# Patient Record
Sex: Female | Born: 1935 | Race: Black or African American | Hispanic: No | Smoking: Never smoker
Health system: Southern US, Community
[De-identification: ages and names within clinical notes are randomized; demographics above are authoritative.]

## PROBLEM LIST (undated history)

## (undated) DIAGNOSIS — K5521 Angiodysplasia of colon with hemorrhage: Secondary | ICD-10-CM

## (undated) DIAGNOSIS — N200 Calculus of kidney: Secondary | ICD-10-CM

## (undated) DIAGNOSIS — I509 Heart failure, unspecified: Secondary | ICD-10-CM

## (undated) DIAGNOSIS — R51 Headache: Secondary | ICD-10-CM

## (undated) DIAGNOSIS — R933 Abnormal findings on diagnostic imaging of other parts of digestive tract: Secondary | ICD-10-CM

## (undated) DIAGNOSIS — R413 Other amnesia: Secondary | ICD-10-CM

## (undated) DIAGNOSIS — I639 Cerebral infarction, unspecified: Secondary | ICD-10-CM

## (undated) HISTORY — PX: APPENDECTOMY: SHX54

## (undated) HISTORY — DX: Cerebral infarction, unspecified: I63.9

## (undated) HISTORY — DX: Headache: R51

## (undated) HISTORY — PX: ABDOMINAL HYSTERECTOMY: SHX81

## (undated) HISTORY — DX: Calculus of kidney: N20.0

## (undated) HISTORY — DX: Other amnesia: R41.3

---

## 2008-12-21 ENCOUNTER — Ambulatory Visit: Payer: Self-pay | Admitting: Cardiology

## 2008-12-21 ENCOUNTER — Inpatient Hospital Stay (HOSPITAL_COMMUNITY): Admission: EM | Admit: 2008-12-21 | Discharge: 2008-12-23 | Payer: Self-pay | Admitting: Neurology

## 2008-12-23 ENCOUNTER — Encounter (INDEPENDENT_AMBULATORY_CARE_PROVIDER_SITE_OTHER): Payer: Self-pay | Admitting: Neurology

## 2010-08-19 LAB — URINALYSIS, MICROSCOPIC ONLY
Glucose, UA: NEGATIVE mg/dL
Ketones, ur: NEGATIVE mg/dL
Leukocytes, UA: NEGATIVE
Nitrite: NEGATIVE
Specific Gravity, Urine: 1.018 (ref 1.005–1.030)
pH: 5 (ref 5.0–8.0)

## 2010-08-19 LAB — HEMOGLOBIN A1C
Hgb A1c MFr Bld: 5.2 % (ref 4.6–6.1)
Mean Plasma Glucose: 103 mg/dL

## 2010-08-19 LAB — RETICULOCYTES: Retic Count, Absolute: 13 10*3/uL — ABNORMAL LOW (ref 19.0–186.0)

## 2010-08-19 LAB — FERRITIN: Ferritin: 133 ng/mL (ref 10–291)

## 2010-08-19 LAB — LIPID PANEL
HDL: 85 mg/dL (ref >39)
Total CHOL/HDL Ratio: 3.2 RATIO
VLDL: 15 mg/dL (ref 0–40)

## 2010-08-19 LAB — FOLATE: Folate: >20 ng/mL

## 2010-08-19 LAB — IRON AND TIBC
Iron: 43 ug/dL (ref 42–135)
Saturation Ratios: 17 % — ABNORMAL LOW (ref 20–55)
TIBC: 255 ug/dL (ref 250–470)

## 2010-08-19 LAB — VITAMIN B12: Vitamin B-12: 529 pg/mL (ref 211–911)

## 2010-09-25 NOTE — H&P (Signed)
NAMEDELAYSIA, PAUP           ACCOUNT NO.:  [id number]   MEDICAL RECORD NO.:  [id number]          PATIENT TYPE:  INP   LOCATION:  3104                         FACILITY:  [facility]   PHYSICIAN:  Pramod P. Leonie Man, MD    DATE OF BIRTH:  1922-09-08   DATE OF ADMISSION:  12/21/2008  DATE OF DISCHARGE:                              HISTORY & PHYSICAL   REFERRING PHYSICIAN:  Dr. Deatra Robinson from [facility].   REASON FOR REFERRAL:  Intracerebral hemorrhage.   HISTORY OF PRESENT ILLNESS:  Ms. Pfingston is an 77 year old pleasant  African American lady, who developed sudden onset of generalized  headache at 9 a.m. today.  She described the headache as being all over.  She felt nauseous and in fact she had vertiginous symptoms, which lasted  several hours.  This was positional.  When she was sitting up and  standing, she was not vertiginous, but when she tried to lie down in  bed, she felt her head was spinning.  She was nauseous and she threw up  several times.  She denied any slurred speech, difficulty walking,  balance problems, focal weakness, or numbness.  She was taken to  [facility] where she was found to be photophobic,  but without any focal neurological deficits.  Blood pressure was not  found to be significantly elevated.  A CT scan of the head showed a 1.2  x 0.5-cm left inferofrontal subcortical hemorrhage with some mass  effect, but no intraventricular extension or midline shift.  She was  awake and interactive.  Due to absence of any neurosurgical help, the  patient was transferred to [facility] to the Neurological ICU for higher  level of neurological care.  She remained stable during the  transportation, and has had no further symptoms.  She received some  fentanyl and Reglan with only partial relief of her headache.  She has  no known prior history of neurological problems, stroke, TIA.  She has  no history of hypertension  either.   Her past medical history is significant for hyperlipidemia.   HOME MEDICATIONS:  1. Plavix 75 mg a day.  2. Niaspan ER 500 mg daily.  3. Prevacid 30 mg a day.  4. Vitamin E 400 mg a day.  5. Fish oil 100 mg a day.  6. Calcium with vitamin D 600 once a day.   SOCIAL HISTORY:  She is independent activity of daily living.  Lives  alone in [place] and recently son is living with her for the last 1  month.  She does not smoke or drink.   MEDICATION ALLERGIES:  None listed.   FAMILY HISTORY:  Noncontributory.   REVIEW OF SYSTEMS:  Negative for any recent fever, cough, chest pain,  shortness of breath, diarrhea, or other illness.   PHYSICAL EXAMINATION:  GENERAL:  A pleasant African American elderly  lady, who looks younger than her age.  VITAL SIGNS:  She is afebrile, pulse rate is 78 per minute, blood  pressure 137/95.  Distal pulses are well felt.  HEAD:  Nontraumatic.  NECK:  Supple.  There is no bruits.  ENT:  Unremarkable.  CARDIAC:  No murmur or gallop.  LUNGS:  Clear to auscultation.  NEUROLOGIC:  She is pleasant, awake, alert, cooperative.  She has  photophobia and tends to keep her eye closed, but can open it when asked  to do so.  Pupils are equal reactive.  Eye movements are full range.  There is no nystagmus or visual field deficit.  Face is symmetric.  Tongue is midline.  Motor system exam reveals no upper or lower  extremity drift.  There was no focal weakness.  Deep tendon reflexes are  2+ and symmetric.  Plantars are downgoing.  Finger-to-nose and  coordination are accurate.  Sensation is normal.  Gait was not tested.  On the NIH stroke scale, she is scored 0.  Glasgow coma scale, she  scored 10.   Data reviewed, ER visit note from [facility] was  reviewed.  CT scan of the head shows small 1.2 x 0.5-cm left inferior  frontal subcortical white matter hemorrhage with some surrounding edema  without intraventricular extension.  CBC  and basic metabolic panel labs  performed is unremarkable except alkaline phosphatase was elevated at  340.  SGOT was elevated at 41, bilirubin and serum albumin are normal.  Electrolytes are normal.  WBC count was normal.   IMPRESSION:  An 77 year old lady with left frontal subcortical small  hemorrhage with some surrounding edema of unclear etiology.  She  certainly has no history of hypertension and her clinical history is not  typical for hypertensive hemorrhage.  Possibilities include hemorrhagic  metastasis versus hemorrhagic infarct or some underlying vascular  abnormality.   PLAN:  The patient will be admitted to the Neurological Intensive Care  Unit for further evaluation and management.  We will keep strict control  of blood pressure and keep systolic blood pressure goal below 150.  Check MRI scan of the brain with and without contrast in the morning to  look for any underlying structural or vascular lesions.  Further testing  will be determined by the MRI.  She need may angiogram if vascular  abnormalities identified.  I had a long discussion with the patient,  daughter, son, and other family members and explained the above plan and  answered questions.  She will have a bedside stroke swallow screen and  if she passes, we will start her on diet and medications for headache.  We will have to stop the Plavix indefinitively and may consider resuming  aspirin in a month or so if clinically necessary.           ______________________________  Kathie Rhodes. Leonie Man, MD     PPS/MEDQ  D:  12/21/2008  T:  12/22/2008  Job:  [id number]

## 2010-09-25 NOTE — Discharge Summary (Signed)
[name], [name] NO.:  [id number]   MEDICAL RECORD NO.:  [id number]          PATIENT TYPE:  INP   LOCATION:  3104                         FACILITY:  [facility]   PHYSICIAN:  Marcial Pacas, MD          DATE OF BIRTH:  04/15/1923   DATE OF ADMISSION:  12/21/2008  DATE OF DISCHARGE:  12/23/2008                               DISCHARGE SUMMARY   DIAGNOSES AT TIME OF DISCHARGE:  1. Small left frontal subcortical hemorrhage, question etiology not      hypertensive, not metastatic, question related to small-vessel      disease.  2. 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  3. Hyperlipidemia.   MEDICINES AT TIME OF DISCHARGE:  1. Niaspan ER 500 mg a day.  2. Prevacid 30 mg a day.  3. Vitamin E 400 mg a day.  4. Fish oil 100 mg a day.  5. Calcium plus vitamin D 600 mg once a day.   STUDIES PERFORMED:  1. CT of the brain on admission was done at [facility] where it      showed a 1.2 x 0.5-cm left inferior frontal subcortical hemorrhage      with some mass effect, but no intraventricular extension or midline      shift.  MRI of the brain done here showed a 10-mm focus of      hemorrhage in the anterior left corona radiata with mild      surrounding edema and no mass effect.  Favored acute hypertensive/      small vessel hemorrhage in light of other sequelae of advanced      small vessel disease.  No enhancement.  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  2. MRA of the brain showed intracranial atherosclerosis of medium-      sized vessels included bilateral PCA and right ACA branches.      Preserved distal flow and no focal branch occlusion.  No      abnormalities.  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  3. EKG shows sinus rhythm.  4. Laboratory studies showed hemoglobin A1c 5.2, cholesterol  270,      triglycerides 73, HDL 85, and LDL 170.  Sedimentation rate 44.      Urinalysis negative.  Anemia panel shows red blood cells 3.24,      reticulocyte 13.  Iron 43, TIBC 255%, saturation is 17.  UIBC 212,      vitamin B12 529 and serum folate greater than 20.   HISTORY OF PRESENT ILLNESS:  Ms. Allison Gardner is an 77 year old right-  handed African American female who developed sudden onset of generalized  headache at 9:00 a.m. the day of admission.  She described the headache  has been all over.  She felt nauseous and had vertigo, which lasted  several hours.  This was positional.  When she was  sitting up and  standing, she was not vertiginous, but when she tried to lie down in  bed, she felt her head was spinning.  She was nauseated and threw up  several times.  She denied any slurred speech, difficulty walking,  balance problems, focal weakness or numbness.  She was taken to [facility] where she was found to be photophobic, but  without any focal or neurologic deficits.  Blood pressure was not  elevated.  CT of the brain showed a 1.2 x 0.5 cm left inferior frontal  cortical hemorrhage with some mass effect, but no intraventricular  extension or midline shift.  She was awake and interactive.  Due to  absence of the neurosurgical help within the area, the patient was  transferred to [facility] to the ICU for higher level of neurologic  care.  She remained stable during the transport and had no further  symptoms.  She received Fentanyl and Reglan with only partial relief of  her headache.  She has no known history of neurologic problems stroke or  TIA.  She has no history of hypertension.  She has a significant history  for hyperlipidemia.  She was admitted to the neuro ICU for further  stroke evaluation.  She was not a TPA candidate secondary to hemorrhage.   HOSPITAL COURSE:  MRI did reveal acute hemorrhage the left inferior  frontal region with some mild  surrounding edema.  She had no enhancement  or other hemorrhages.  She had no acute ischemic strokes.  It is felt  the hemorrhage was likely small vessel.  Her blood pressure remained  within normal limits during hospitalization in 100-120s/70s to 80s.  She  was resumed on her home medications except for Plavix, which will be  held indefinitely.  We will consider resumption of low-dose aspirin in 1  month should the patient remained neurologically stable.  PT and OT  evaluated her and felt she was safe to discharge home with either  outpatient or home health therapy of her choice.  This was discussed at  length with her 3 daughters and herself.  At time of this dictation  family is unsure if they will keep her here in [place] with doctor  here or take her back to [place] with doctor there.  Regardless they  will arrange therapy as ordered.   CONDITION ON DISCHARGE:  The patient alert and oriented to person and  place.  She has extraocular movements that are full.  Visual fields were  full and no facial weakness.  She has a minimal left upper extremity  drift and decreased fine motor movement on the left.  She has no lower  extremity weakness.  Her deep tendon reflexes were 2+ bilaterally and  her plantars are down bilaterally.  Heart rate is regular.  Breath  sounds are clear.   DISCHARGE PLAN:  1. Discharge home with family.  2. Home health or outpatient PT and OT.  3. Stop Plavix.  Consider low-dose aspirin in 1 month, if      neurologically stable.  4. Follow up with primary care physician within 1 month.  5. Follow up Dr. Antony Contras in 2 month or neurologist in her area.      Allison Gardner, N.P.      Marcial Pacas, MD  Electronically Signed    SB/MEDQ  D:  12/23/2008  T:  12/24/2008  Job:  [id number]   cc:   Pramod P. Leonie Man, MD  Andres Shad

## 2012-08-08 ENCOUNTER — Telehealth: Payer: Self-pay | Admitting: *Deleted

## 2012-08-08 NOTE — Telephone Encounter (Signed)
Spoke to patient to cancel appointment. Will call her back next week to reschedule. If patient does not here back Wedensday she will call me because she said it is something wrong with her phone.

## 2012-08-20 ENCOUNTER — Ambulatory Visit: Payer: Self-pay | Admitting: Nurse Practitioner

## 2012-08-26 NOTE — Telephone Encounter (Signed)
Spoke to the patient and she scheduled for 09-03-12 to see cm at 315.

## 2012-09-03 ENCOUNTER — Ambulatory Visit (INDEPENDENT_AMBULATORY_CARE_PROVIDER_SITE_OTHER): Payer: Medicare Other | Admitting: Nurse Practitioner

## 2012-09-03 ENCOUNTER — Encounter: Payer: Self-pay | Admitting: Nurse Practitioner

## 2012-09-03 VITALS — BP 130/60 | HR 81 | Ht 60.0 in | Wt 125.0 lb

## 2012-09-03 DIAGNOSIS — R42 Dizziness and giddiness: Secondary | ICD-10-CM | POA: Insufficient documentation

## 2012-09-03 DIAGNOSIS — I6990 Unspecified sequelae of unspecified cerebrovascular disease: Secondary | ICD-10-CM | POA: Insufficient documentation

## 2012-09-03 DIAGNOSIS — R51 Headache: Secondary | ICD-10-CM

## 2012-09-03 DIAGNOSIS — R519 Headache, unspecified: Secondary | ICD-10-CM | POA: Insufficient documentation

## 2012-09-03 NOTE — Progress Notes (Signed)
HPI: Patient returns for followup after her last visit 11/07/2011. 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 She continues with intermittent episodes of dizziness. Last carotid Dopplers without stenosis. She is currently on baby aspirin with minimal bruising. She takes an occasional Tylenol for headache. She has no new complaints  ROS:    memory, feeling cold, allergies, not enough sleep, decreased energy, sleepiness, headache,   Physical Exam General: well developed, well nourished, seated, in no evident distress Head: head normocephalic and atraumatic. Oropharynx benign Neck: supple with no carotid or supraclavicular bruits Cardiovascular: regular rate and rhythm, no murmurs  Neurologic Exam Mental Status: Awake and fully alert. Oriented to place and time. Follows one 2 and 3 step commands speech and language appear normal   Cranial Nerves:Pupils equal, briskly reactive to light. Extraocular movements full without nystagmus. Visual fields full to confrontation. Hearing decreased bilaterally  Facial sensation intact. Face, tongue, palate move normally and symmetrically. Neck flexion and extension normal.  Motor: Normal bulk and tone. Normal strength in all tested extremity muscles. No focal weakness Sensory.: intact to touch and pinprick and vibratory.  Coordination: Rapid alternating movements normal in all extremities. Finger-to-nose and heel-to-shin performed accurately bilaterally. Gait and Station: Arises from chair without difficulty. Slow steady gait mild difficulty with tandem no assistive device   Reflexes: 2+ and symmetric. Toes downgoing.     ASSESSMENT: Remote history of intercranial hemorrhage, pontine stroke and lacunar infarcts transient dizziness likely peripheral positional vertigo.     PLAN: Stay on Aspirin for secondary stroke prevention  Avoid  sudden head turning movements to prevent dizziness  followup in one year and when necessary as necessary We will repeat carotid Dopplers next year  Dennie Bible, GNP-BC APRN

## 2012-09-03 NOTE — Patient Instructions (Addendum)
Stay on Aspirin for secondary stroke prevention  Avoid sudden head turning movements to prevent dizziness  followup in one year and when necessary as necessary

## 2013-04-15 DIAGNOSIS — S32599A Other specified fracture of unspecified pubis, initial encounter for closed fracture: Secondary | ICD-10-CM | POA: Insufficient documentation

## 2013-04-15 HISTORY — DX: Other specified fracture of unspecified pubis, initial encounter for closed fracture: S32.599A

## 2013-09-01 ENCOUNTER — Encounter: Payer: Self-pay | Admitting: Neurology

## 2013-09-01 ENCOUNTER — Encounter (INDEPENDENT_AMBULATORY_CARE_PROVIDER_SITE_OTHER): Payer: Self-pay

## 2013-09-01 ENCOUNTER — Ambulatory Visit (INDEPENDENT_AMBULATORY_CARE_PROVIDER_SITE_OTHER): Payer: Medicare Other | Admitting: Neurology

## 2013-09-01 VITALS — BP 177/85 | HR 86 | Ht 60.0 in | Wt 128.0 lb

## 2013-09-01 DIAGNOSIS — I6529 Occlusion and stenosis of unspecified carotid artery: Secondary | ICD-10-CM

## 2013-09-01 DIAGNOSIS — G3184 Mild cognitive impairment, so stated: Secondary | ICD-10-CM

## 2013-09-01 DIAGNOSIS — R413 Other amnesia: Secondary | ICD-10-CM | POA: Insufficient documentation

## 2013-09-01 NOTE — Progress Notes (Signed)
HPI:    Update 09/01/2013 : She returns for followup of her last visit a year ago. She continues to do well from a neurovascular standpoint without recurrent stroke or TIA symptoms. She did have a fall in last November in a store but did not have major injuries. She had laser eye surgery last month which went well. She still continues to have occasional positional dizziness but this is not as bothersome. She walks with a cane only for outdoors long distances but does fairly well without it indoors. Her blood pressure on arrival today was elevated at 177/85 but when I checked it it was down to 140/80. She has been tolerating aspirin well. Office visit 09/03/2012 ;Patient returns for followup after her last visit 11/07/2011. 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 She continues with intermittent episodes of dizziness. Last carotid Dopplers without stenosis. She is currently on baby aspirin with minimal bruising. She takes an occasional Tylenol for headache. She has no new complaints  ROS:    Positive for appetite change, fatigue, light sensitivity, runny nose, cough, leg swelling, constipation, incontinence of bladder, joint pain, walking difficulty, itching, agitation, memory loss dizziness and all other systems negative. Filed Vitals:   09/01/13 1452  BP: 177/85  Pulse: 86    Physical Exam General: well developed, well nourished, seated, in no evident distress Head: head normocephalic and atraumatic. Oropharynx benign Neck: supple with no carotid or supraclavicular bruits Cardiovascular: regular rate and rhythm, no murmurs  Neurologic Exam Mental Status: Awake and fully alert. Oriented to place and time. Follows one 2 and 3 step commands speech and language appear normal  Mini-Mental status exam score 26/29 with deficits in orientation and recall .Animal naming test 13.  Clock .4/4. Ron Parker index of activities of daily living 6 which is independent. Geriatric depression scale 2 not depressed Cranial Nerves:Pupils equal, briskly reactive to light. Extraocular movements full without nystagmus. Visual fields full to confrontation. Hearing decreased bilaterally  Facial sensation intact. Face, tongue, palate move normally and symmetrically. Neck flexion and extension normal.  Motor: Normal bulk and tone. Normal strength in all tested extremity muscles. No focal weakness Sensory.: intact to touch and pinprick and vibratory.  Coordination: Rapid alternating movements normal in all extremities. Finger-to-nose and heel-to-shin performed accurately bilaterally. Gait and Station: Arises from chair without difficulty. Slow steady gait mild difficulty with tandem no assistive device   Reflexes: 2+ and symmetric. Toes downgoing.     ASSESSMENT: Remote history of intracranial hemorrhage, pontine stroke and lacunar infarcts transient dizziness likely peripheral positional vertigo.     PLAN: I had a long discussion with the patient and daughter regarding the prior strokes and mild memory loss and answered questions. I recommend she take 2 visual capsules daily and keep herself of occupied mentally challenged activities. I do not believe her memory difficulties are severe enough to justify a trial of Aricept at this point. Return for followup in 6 months with Cecille Rubin, NP. Check followup carotid ultrasound study.

## 2013-09-01 NOTE — Patient Instructions (Signed)
I had a long discussion with the patient and daughter regarding the prior strokes and mild memory loss and answered questions. I recommend she take 2 visual capsules daily and keep herself of occupied mentally challenged activities. I do not believe her memory difficulties are severe enough to justify a trial of Aricept at this point. Return for followup in 6 months with Cecille Rubin, NP. Check followup carotid ultrasound study.

## 2013-09-15 ENCOUNTER — Ambulatory Visit (INDEPENDENT_AMBULATORY_CARE_PROVIDER_SITE_OTHER): Payer: Medicare Other

## 2013-09-15 DIAGNOSIS — R413 Other amnesia: Secondary | ICD-10-CM

## 2013-09-15 DIAGNOSIS — I6529 Occlusion and stenosis of unspecified carotid artery: Secondary | ICD-10-CM

## 2013-10-05 ENCOUNTER — Telehealth: Payer: Self-pay | Admitting: *Deleted

## 2013-10-05 NOTE — Telephone Encounter (Signed)
Pt calling for her carotid doppler study results.  Normal study.  Repeat in 12 months.  Pt verbalized understanding.

## 2014-03-10 ENCOUNTER — Ambulatory Visit: Payer: Medicare Other | Admitting: Nurse Practitioner

## 2014-03-23 ENCOUNTER — Ambulatory Visit (INDEPENDENT_AMBULATORY_CARE_PROVIDER_SITE_OTHER): Payer: Medicare Other | Admitting: Nurse Practitioner

## 2014-03-23 ENCOUNTER — Encounter: Payer: Self-pay | Admitting: Nurse Practitioner

## 2014-03-23 VITALS — BP 145/68 | HR 82 | Temp 97.6°F | Ht 60.0 in | Wt 119.0 lb

## 2014-03-23 DIAGNOSIS — I6990 Unspecified sequelae of unspecified cerebrovascular disease: Secondary | ICD-10-CM

## 2014-03-23 DIAGNOSIS — R413 Other amnesia: Secondary | ICD-10-CM

## 2014-03-23 DIAGNOSIS — I6529 Occlusion and stenosis of unspecified carotid artery: Secondary | ICD-10-CM

## 2014-03-23 DIAGNOSIS — G3184 Mild cognitive impairment, so stated: Secondary | ICD-10-CM

## 2014-03-23 NOTE — Patient Instructions (Signed)
Memory score is stable.  Recent carotid doppler study negative F/U  8 months

## 2014-03-23 NOTE — Progress Notes (Signed)
[facility]  PATIENT: Allison Gardner DOB: [birth year]   REASON FOR VISIT: follow-up for memory loss, history of intracranial hemorrhage   HISTORY OF PRESENT ILLNESS:Allison Gardner, 80 year old female returns for follow-up. She was last seen by Dr.'s Leonie Man 09/01/2013. She continues to do well from a neurovascular standpoint without recurrent stroke or TIA symptoms.Recent fall with pelvic fx. She was not using her assistive device when she fell. She  still continues to have occasional positional dizziness but this is not as bothersome. She walks with a cane.  She has been tolerating aspirin well with minimal bruising.Carotid Doppler after last visit was negative for significant stenosis.She continues to live independently and perform all of her ADLs. She continues to cook. She no longer drives. She returns for reevaluation  Office visit 09/03/2012 ;Patient returns for followup after her last visit 11/07/2011. 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 She continues with intermittent episodes of dizziness. Last carotid Dopplers without stenosis. She is currently on baby aspirin with minimal bruising. She takes an occasional Tylenol for headache. She has no new complaints   REVIEW OF SYSTEMS: Full 14 system review of systems performed and notable only for those listed, all others are neg:  Constitutional: fatigue Cardiovascular: leg swelling Ear/Nose/Throat: N/A  Skin: N/A  Eyes: blurred vision Respiratory: N/A  Gastroitestinal: urinary frequency Hematology/Lymphatic: N/A  Endocrine: N/A Musculoskeletal:joint pain Allergy/Immunology: N/A  Neurological: occasional headache Psychiatric: N/A Sleep : NA   ALLERGIES: Allergies  Allergen Reactions  . Ampicillin   . Demerol [Meperidine]     HOME MEDICATIONS: Outpatient Prescriptions Prior  to Visit  Medication Sig Dispense Refill  . Acetaminophen (TYLENOL PO) Take by mouth.    Marland Kitchen aspirin 81 MG tablet Take 81 mg by mouth daily.    . Calcium-Vitamin D 600-200 MG-UNIT per tablet Take 1 tablet by mouth daily.    . Omega-3 Fatty Acids (FISH OIL) 1000 MG CAPS Take by mouth.    . vitamin E 400 UNIT capsule Take 400 Units by mouth daily.    Marland Kitchen amitriptyline (ELAVIL) 50 MG tablet Take 50 mg by mouth at bedtime.      No facility-administered medications prior to visit.    PAST MEDICAL HISTORY: Past Medical History  Diagnosis Date  . Stroke   . Headache(784.0)     PAST SURGICAL HISTORY: History reviewed. No pertinent past surgical history.  FAMILY HISTORY: History reviewed. No pertinent family history.  SOCIAL HISTORY: History   Social History  . Marital Status: Widowed    Spouse Name: N/A    Number of Children: N/A  . Years of Education: N/A   Occupational History  . Not on file.   Social History Main Topics  . Smoking status: Never Smoker   . Smokeless tobacco: Never Used  . Alcohol Use: No  . Drug Use: No  . Sexual Activity: Not on file   Other Topics Concern  . Not on file   Social History Narrative   Patient lives at home her son stay with her.     PHYSICAL EXAM  Filed Vitals:   03/23/14 1313  BP: 145/68  Pulse: 82  Temp: 97.6 F (36.4 C)  TempSrc: Oral  Height: 5' (1.524 m)  Weight: 119 lb (53.978 kg)   Body mass index is 23.24 kg/(m^2). General: well developed, well nourished, seated, in no evident distress Head: head normocephalic  and atraumatic. Oropharynx benign Neck: supple with no carotid or supraclavicular bruits Cardiovascular: regular rate and rhythm, no murmurs  Neurologic Exam Mental Status: Awake and fully alert. Oriented to place and time. Follows 1, 2 and 3 step commands speech and language appear normal Mini-Mental status exam score 25/29 with deficits in orientation calculation and recall .Animal naming test 14.   Geriatric depression scale 2 not depressed. Cranial Nerves:Pupils equal, briskly reactive to light. Extraocular movements full without nystagmus. Visual fields full to confrontation. Hearing decreased bilaterally Facial sensation intact. Face, tongue, palate move normally and symmetrically. Neck flexion and extension normal.  Motor: Normal bulk and tone. Normal strength in all tested extremity muscles. No focal weakness Sensory.: intact to touch and pinprick and vibratory.  Coordination: Rapid alternating movements normal in all extremities. Finger-to-nose and heel-to-shin performed accurately bilaterally. Gait and Station: Arises from chair without difficulty. Slow steady gait mild difficulty with tandem, ambulates with single-point cane  Reflexes: 2+ and symmetric. Toes downgoing.    DIAGNOSTIC DATA (LABS, IMAGING, TESTING) - I reviewed patient records, labs, notes, testing and imaging myself where available.  ASSESSMENT AND PLAN  80 y.o. year old female  has a past medical history of Stroke and Headache(784.0). here for follow-up. Most recent carotid Doppler 5/6/ 2015 was negative for significant stenosis.   Memory score is stable.  Continue baby aspirin F/U  8 months Dennie Bible, [facility], [facility], APRN  [facility] [address], [place], [place] [address] [phone]

## 2014-03-24 NOTE — Progress Notes (Signed)
I agree with the above plan 

## 2014-03-30 ENCOUNTER — Encounter: Payer: Self-pay | Admitting: Neurology

## 2014-04-05 ENCOUNTER — Encounter: Payer: Self-pay | Admitting: Neurology

## 2014-09-22 ENCOUNTER — Encounter: Payer: Self-pay | Admitting: Neurology

## 2014-09-22 ENCOUNTER — Ambulatory Visit (INDEPENDENT_AMBULATORY_CARE_PROVIDER_SITE_OTHER): Payer: Medicare HMO | Admitting: Neurology

## 2014-09-22 VITALS — BP 155/67 | HR 70

## 2014-09-22 DIAGNOSIS — I6990 Unspecified sequelae of unspecified cerebrovascular disease: Secondary | ICD-10-CM | POA: Diagnosis not present

## 2014-09-22 NOTE — Progress Notes (Signed)
[facility]  PATIENT: Allison Gardner DOB: [birth year]   REASON FOR VISIT: follow-up for memory loss, history of intracranial hemorrhage   HISTORY OF PRESENT ILLNESS:Ms. Allison Gardner, 81 year old female returns for follow-up. She was last seen by Dr.'s Leonie Man 09/01/2013. She continues to do well from a neurovascular standpoint without recurrent stroke or TIA symptoms.Recent fall with pelvic fx. She was not using her assistive device when she fell. She  still continues to have occasional positional dizziness but this is not as bothersome. She walks with a cane.  She has been tolerating aspirin well with minimal bruising.Carotid Doppler after last visit was negative for significant stenosis.She continues to live independently and perform all of her ADLs. She continues to cook. She no longer drives. She returns for reevaluation  Office visit 09/03/2012 ;Patient returns for followup after her last visit 11/07/2011. 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 She continues with intermittent episodes of dizziness. Last carotid Dopplers without stenosis. She is currently on baby aspirin with minimal bruising. She takes an occasional Tylenol for headache. She has no new complaints Update 09/22/2014 : She returns for follow-up today after last visit 6 months ago. She is accompanied by 2 of her sons who provide history. She is celebrating her 89th birthday today. She continues to do in reasonably good health. Her blood pressure is quite well controlled usually do it is slightly elevated at 155/69 in office today. She is lives with her son and is fairly independent in most activities of daily living. She the son has to handle only her finances. She walks with a cane reasonably well though she continues to drag the left leg which is stiff and weak. She has had no falls. Her  mild memory difficulties remain unchanged. She's had no new interval health problems.  REVIEW OF SYSTEMS: Full 14 system review of systems performed and notable only for those listed, all others are neg: Memory loss, headache, chills, ringing in the ears, leg swelling, restless legs, insomnia, daytime sleepiness, walking difficulty and all other systems negative    ALLERGIES: Allergies  Allergen Reactions  . Ampicillin   . Demerol [Meperidine]     HOME MEDICATIONS: Outpatient Prescriptions Prior to Visit  Medication Sig Dispense Refill  . Acetaminophen (TYLENOL PO) Take by mouth.    Marland Kitchen aspirin 81 MG tablet Take 81 mg by mouth daily.    . Calcium-Vitamin D 600-200 MG-UNIT per tablet Take 1 tablet by mouth daily.    . Omega-3 Fatty Acids (FISH OIL) 1000 MG CAPS Take by mouth.    . vitamin E 400 UNIT capsule Take 400 Units by mouth daily.     No facility-administered medications prior to visit.    PAST MEDICAL HISTORY: Past Medical History  Diagnosis Date  . Stroke   . Headache(784.0)   . Kidney stones     PAST SURGICAL HISTORY: Past Surgical History  Procedure Laterality Date  . Abdominal hysterectomy    . Appendectomy      FAMILY HISTORY: History reviewed. No pertinent family history.  SOCIAL HISTORY: History   Social History  . Marital Status: Widowed    Spouse Name: N/A  . Number of Children: N/A  . Years of Education: N/A   Occupational History  . Not on file.   Social History Main Topics  . Smoking status: Never Smoker   . Smokeless tobacco: Never  Used  . Alcohol Use: No  . Drug Use: No  . Sexual Activity: Not on file   Other Topics Concern  . Not on file   Social History Narrative   Patient lives at home her son stay with her.     PHYSICAL EXAM  Filed Vitals:   09/22/14 1314  BP: 155/67  Pulse: 70   There is no weight on file to calculate BMI. General: well developed, well nourished, seated, in no evident distress. Mild  kyphoscoliosis Head: head normocephalic and atraumatic.    Neck: supple with no carotid or supraclavicular bruits Cardiovascular: regular rate and rhythm, no murmurs  Neurologic Exam Mental Status: Awake and fully alert. Oriented to place and time. Follows 1, 2 and 3 step commands speech and language appear normal Mini-Mental status exam score 28/30 with deficits in attention and recall .Animal naming test 10. Clock drawing 3/4. Geriatric depression scale 1 not depressed. Cranial Nerves:Pupils equal, briskly reactive to light. Extraocular movements full without nystagmus. Visual fields full to confrontation. Hearing decreased bilaterally Facial sensation intact. Face, tongue, palate move normally and symmetrically. Neck flexion and extension normal.  Motor: Normal bulk and tone. Mild stiffness of right leg with 4/5 weakness. Slight increased tone in the right leg. Sensory.: intact to touch and pinprick and vibratory.  Coordination: Rapid alternating movements normal in all extremities. Finger-to-nose and heel-to-shin performed accurately bilaterally. Gait and Station: Arises from chair without difficulty. Slow steady gait mild difficulty with dragging of right leg   ambulates with single-point cane  Reflexes: 2+ and symmetric. Toes downgoing.    DIAGNOSTIC DATA (LABS, IMAGING, TESTING) - I reviewed patient records, labs, notes, testing and imaging myself where available.  ASSESSMENT AND PLAN  81 y.o. year old female  has a past medical history of left basal ganglia hemorrhage in 2010 Headache(784.0); and Kidney stones. here for follow-up.     I had a long discussion with the patient and her 2 sons regarding her remote stroke and intracerebral hemorrhage and long-standing mild cognitive and gait and balance difficulties. She was instructed on fall and safety precautions. He also had a brief discussion about CODE STATUS and DO NOT RESUSCITATE. Patient and family will discuss this further at  home and make a decision. Continue aspirin for stroke prevention and strict control of hypertension with blood pressure goal below 140/90. Return for follow-up in 6 months with Cecille Rubin, NP or call earlier if necessary. Antony Contras, MD [facility] [address], [place], [place] [address] [phone]

## 2014-09-22 NOTE — Patient Instructions (Addendum)
I had a long discussion with the patient and her 2 sons regarding her remote stroke and intracerebral hemorrhage and long-standing mild cognitive and gait and balance difficulties. She was instructed on fall and safety precautions. He also had a brief discussion about CODE STATUS and DO NOT RESUSCITATE. Patient and family will discuss this further at home and make a decision. Continue aspirin for stroke prevention and strict control of hypertension with blood pressure goal below 140/90. Return for follow-up in 6 months with Cecille Rubin, NP or call earlier if necessary. Fall Prevention and Home Safety Falls cause injuries and can affect all age groups. It is possible to use preventive measures to significantly decrease the likelihood of falls. There are many simple measures which can make your home safer and prevent falls. OUTDOORS  Repair cracks and edges of walkways and driveways.  Remove high doorway thresholds.  Trim shrubbery on the main path into your home.  Have good outside lighting.  Clear walkways of tools, rocks, debris, and clutter.  Check that handrails are not broken and are securely fastened. Both sides of steps should have handrails.  Have leaves, snow, and ice cleared regularly.  Use sand or salt on walkways during winter months.  In the garage, clean up grease or oil spills. BATHROOM  Install night lights.  Install grab bars by the toilet and in the tub and shower.  Use non-skid mats or decals in the tub or shower.  Place a plastic non-slip stool in the shower to sit on, if needed.  Keep floors dry and clean up all water on the floor immediately.  Remove soap buildup in the tub or shower on a regular basis.  Secure bath mats with non-slip, double-sided rug tape.  Remove throw rugs and tripping hazards from the floors. BEDROOMS  Install night lights.  Make sure a bedside light is easy to reach.  Do not use oversized bedding.  Keep a telephone by your  bedside.  Have a firm chair with side arms to use for getting dressed.  Remove throw rugs and tripping hazards from the floor. KITCHEN  Keep handles on pots and pans turned toward the center of the stove. Use back burners when possible.  Clean up spills quickly and allow time for drying.  Avoid walking on wet floors.  Avoid hot utensils and knives.  Position shelves so they are not too high or low.  Place commonly used objects within easy reach.  If necessary, use a sturdy step stool with a grab bar when reaching.  Keep electrical cables out of the way.  Do not use floor polish or wax that makes floors slippery. If you must use wax, use non-skid floor wax.  Remove throw rugs and tripping hazards from the floor. STAIRWAYS  Never leave objects on stairs.  Place handrails on both sides of stairways and use them. Fix any loose handrails. Make sure handrails on both sides of the stairways are as long as the stairs.  Check carpeting to make sure it is firmly attached along stairs. Make repairs to worn or loose carpet promptly.  Avoid placing throw rugs at the top or bottom of stairways, or properly secure the rug with carpet tape to prevent slippage. Get rid of throw rugs, if possible.  Have an electrician put in a light switch at the top and bottom of the stairs. OTHER FALL PREVENTION TIPS  Wear low-heel or rubber-soled shoes that are supportive and fit well. Wear closed toe shoes.  When  using a stepladder, make sure it is fully opened and both spreaders are firmly locked. Do not climb a closed stepladder.  Add color or contrast paint or tape to grab bars and handrails in your home. Place contrasting color strips on first and last steps.  Learn and use mobility aids as needed. Install an electrical emergency response system.  Turn on lights to avoid dark areas. Replace light bulbs that burn out immediately. Get light switches that glow.  Arrange furniture to create clear  pathways. Keep furniture in the same place.  Firmly attach carpet with non-skid or double-sided tape.  Eliminate uneven floor surfaces.  Select a carpet pattern that does not visually hide the edge of steps.  Be aware of all pets. OTHER HOME SAFETY TIPS  Set the water temperature for 120 F (48.8 C).  Keep emergency numbers on or near the telephone.  Keep smoke detectors on every level of the home and near sleeping areas. Document Released: 04/19/2002 Document Revised: 10/29/2011 Document Reviewed: 07/19/2011 [facility] Patient Information 2015 [place], [place]. This information is not intended to replace advice given to you by your health care provider. Make sure you discuss any questions you have with your health care provider.

## 2014-10-06 ENCOUNTER — Telehealth: Payer: Self-pay | Admitting: Neurology

## 2014-10-06 NOTE — Telephone Encounter (Signed)
Patient called to give Dr Leonie Man her PCP's phone of # [phone]. She stated just in case Dr Leonie Man wanted to talk with him.

## 2014-10-06 NOTE — Telephone Encounter (Signed)
Dr Myna Bright, PCP, [place], [place], [place] phone number added to pt's demographics.

## 2014-10-06 NOTE — Telephone Encounter (Signed)
Thanks

## 2015-03-29 ENCOUNTER — Ambulatory Visit (INDEPENDENT_AMBULATORY_CARE_PROVIDER_SITE_OTHER): Payer: Medicare HMO | Admitting: Nurse Practitioner

## 2015-03-29 ENCOUNTER — Encounter: Payer: Self-pay | Admitting: Nurse Practitioner

## 2015-03-29 ENCOUNTER — Ambulatory Visit: Payer: Medicare HMO | Admitting: Nurse Practitioner

## 2015-03-29 VITALS — BP 153/71 | HR 69 | Ht 60.0 in | Wt 117.8 lb

## 2015-03-29 DIAGNOSIS — I6990 Unspecified sequelae of unspecified cerebrovascular disease: Secondary | ICD-10-CM

## 2015-03-29 DIAGNOSIS — G3184 Mild cognitive impairment, so stated: Secondary | ICD-10-CM | POA: Diagnosis not present

## 2015-03-29 DIAGNOSIS — R413 Other amnesia: Secondary | ICD-10-CM | POA: Diagnosis not present

## 2015-03-29 NOTE — Patient Instructions (Signed)
Continue aspirin for secondary stroke prevention Memory score is stable F/U in 6 months to 8 months

## 2015-03-29 NOTE — Progress Notes (Addendum)
[facility]  PATIENT: Allison Gardner DOB: September 03, 1922   REASON FOR VISIT: Follow-up for memory, loss history of intercranial hemorrhage, pontine stroke and lacunar infarcts HISTORY FROM: Patient and sons   HISTORY OF PRESENT ILLNESS:Ms. Allison Gardner, 81 year old female returns for follow-up. She was last seen by Dr.'s Leonie Man 09/01/2013. She continues to do well from a neurovascular standpoint without recurrent stroke or TIA symptoms.Recent fall with pelvic fx. She was not using her assistive device when she fell. She still continues to have occasional positional dizziness but this is not as bothersome. She walks with a cane. She has been tolerating aspirin well with minimal bruising.Carotid Doppler after last visit was negative for significant stenosis.She continues to live independently and perform all of her ADLs. She continues to cook. She no longer drives. She returns for reevaluation  Update 03/29/2015 : Allison Gardner , 81 year old female returns for follow-up  She is accompanied by 2 of her sons who provide history. She has a history of intercranial hemorrhage, pontine stroke and lacunar infarcts, along with mild cognitive impairment. She continues to do reasonably well.  Her blood pressure is quite well controlled usually  it is slightly elevated at 153/71 in office today. She  lives with her son and is fairly independent in most activities of daily living. The son has to handle only her finances. She walks with a cane reasonably well though she continues to drag the left leg which is stiff and weak. She has had no falls. Her mild memory difficulties remain unchanged. She's had no new interval health problems. She returns for reevaluation    REVIEW OF SYSTEMS: Full 14 system review of systems performed and notable only for those listed, all others are neg:  Constitutional: Fatigue Cardiovascular: neg Ear/Nose/Throat: neg  Skin: neg Eyes: neg Respiratory:  neg Gastroitestinal: Urinary frequency, constipation  Hematology/Lymphatic: neg  Endocrine: Intolerance to cold Musculoskeletal: Joint pain Allergy/Immunology: neg Neurological: Memory loss Psychiatric: neg Sleep : neg   ALLERGIES: Allergies  Allergen Reactions  . Ampicillin   . Demerol [Meperidine]     HOME MEDICATIONS: Outpatient Prescriptions Prior to Visit  Medication Sig Dispense Refill  . Acetaminophen (TYLENOL PO) Take by mouth.    Marland Kitchen aspirin 81 MG tablet Take 81 mg by mouth daily.    . Calcium-Vitamin D 600-200 MG-UNIT per tablet Take 1 tablet by mouth daily.    . carvedilol (COREG) 6.25 MG tablet     . Omega-3 Fatty Acids (FISH OIL) 1000 MG CAPS Take by mouth.    . vitamin E 400 UNIT capsule Take 400 Units by mouth daily.     No facility-administered medications prior to visit.    PAST MEDICAL HISTORY: Past Medical History  Diagnosis Date  . Stroke ([place])   . Headache(784.0)   . Kidney stones     PAST SURGICAL HISTORY: Past Surgical History  Procedure Laterality Date  . Abdominal hysterectomy    . Appendectomy      FAMILY HISTORY: History reviewed. No pertinent family history.  SOCIAL HISTORY: Social History   Social History  . Marital Status: Widowed    Spouse Name: N/A  . Number of Children: N/A  . Years of Education: N/A   Occupational History  . Not on file.   Social History Main Topics  . Smoking status: Never Smoker   . Smokeless tobacco: Never Used  . Alcohol Use: No  . Drug Use: No  . Sexual Activity: Not on file   Other Topics Concern  .  Not on file   Social History Narrative   Patient lives at home her son stay with her.     PHYSICAL EXAM  Filed Vitals:   03/29/15 1112  BP: 153/71  Pulse: 69  Height: 5' (1.524 m)  Weight: 117 lb 12.8 oz (53.434 kg)   Body mass index is 23.01 kg/(m^2). General: well developed, well nourished, seated, in no evident distress. Mild kyphoscoliosis Head: head normocephalic and  atraumatic.  Neck: supple with no carotid or supraclavicular bruits Cardiovascular: regular rate and rhythm, no murmurs  Neurologic Exam Mental Status: Awake and fully alert. Oriented to place and time. Follows 1, 2 and 3 step commands speech and language appear normal Mini-Mental status exam score 25/30 with deficits in attention and recall and orientation .Animal naming test 13. Clock drawing4/4.  Cranial Nerves:Pupils equal, briskly reactive to light. Extraocular movements full without nystagmus. Visual fields full to confrontation. Hearing decreased bilaterally Facial sensation intact. Face, tongue, palate move normally and symmetrically. Neck flexion and extension normal.  Motor: Normal bulk and tone. Mild stiffness of right leg with 4/5 weakness. Slight increased tone in the right leg. Sensory.: intact to touch and pinprick and vibratory.  Coordination: Rapid alternating movements normal in all extremities. Finger-to-nose and heel-to-shin performed accurately bilaterally. Gait and Station: Arises from chair without difficulty. Slow steady gait mild difficulty with dragging of right leg ambulates with single-point cane  Reflexes: 2+ and symmetric. Toes downgoing.    DIAGNOSTIC DATA (LABS, IMAGING, TESTING) -   ASSESSMENT AND PLAN  81 y.o. year old female  has a past medical history of Stroke [facility]); memory loss and gait abnormality here to follow-up . The patient is a current patient of Dr. Leonie Man  who is out of the office today . This note is sent to the work in doctor.     Continue aspirin for secondary stroke prevention Memory score is stable Strict control of blood pressure with systolic goal below [id number], today  153/71 Continue to use cane for unsteady gait and balance issues, at risk for falls F/U in 6 months to 8 months Dennie Bible, [facility], [facility], [facility] [address] [place], [place] [address] [phone])  I reviewed  the above note and documentation by the Nurse Practitioner and agree with the history, physical exam, assessment and plan as outlined above. I was immediately available for face-to-face consultation. Star Age, MD, PhD [facility])

## 2015-09-26 ENCOUNTER — Ambulatory Visit: Payer: Medicare HMO | Admitting: Nurse Practitioner

## 2015-09-28 ENCOUNTER — Ambulatory Visit (INDEPENDENT_AMBULATORY_CARE_PROVIDER_SITE_OTHER): Payer: Medicare HMO | Admitting: Nurse Practitioner

## 2015-09-28 ENCOUNTER — Other Ambulatory Visit: Payer: Self-pay | Admitting: *Deleted

## 2015-09-28 ENCOUNTER — Encounter: Payer: Self-pay | Admitting: Nurse Practitioner

## 2015-09-28 VITALS — BP 148/72 | HR 72 | Ht 60.0 in | Wt 114.6 lb

## 2015-09-28 DIAGNOSIS — R413 Other amnesia: Secondary | ICD-10-CM | POA: Diagnosis not present

## 2015-09-28 DIAGNOSIS — M21372 Foot drop, left foot: Secondary | ICD-10-CM

## 2015-09-28 DIAGNOSIS — I6990 Unspecified sequelae of unspecified cerebrovascular disease: Secondary | ICD-10-CM

## 2015-09-28 DIAGNOSIS — G3184 Mild cognitive impairment, so stated: Secondary | ICD-10-CM

## 2015-09-28 NOTE — Progress Notes (Addendum)
[facility]  PATIENT: Allison Gardner DOB: 11-22-1922   REASON FOR VISIT: Mild cognitive impairment, memory loss ,history of stroke, intracranial hemorrhage HISTORY FROM: Patient    HISTORY OF PRESENT ILLNESS: HISTORY PSMs. [name], 82 year old female returns for follow-up. She was last seen by Dr.'s Leonie Man 09/01/2013. She continues to do well from a neurovascular standpoint without recurrent stroke or TIA symptoms.Recent fall with pelvic fx. She was not using her assistive device when she fell. She still continues to have occasional positional dizziness but this is not as bothersome. She walks with a cane. She has been tolerating aspirin well with minimal bruising.Carotid Doppler after last visit was negative for significant stenosis.She continues to live independently and perform all of her ADLs. She continues to cook. She no longer drives. She returns for reevaluation  Update 03/29/2015 CM: Allison Gardner , 82 year old female returns for follow-up She is accompanied by 2 of her sons who provide history. She has a history of intercranial hemorrhage, pontine stroke and lacunar infarcts, along with mild cognitive impairment. She continues to do reasonably well. Her blood pressure is quite well controlled usually it is slightly elevated at 153/71 in office today. She lives with her son and is fairly independent in most activities of daily living. The son has to handle only her finances. She walks with a cane reasonably well though she continues to drag the left leg which is stiff and weak. She has had no falls. Her mild memory difficulties remain unchanged. She's had no new interval health problems. She returns for reevaluation UPDATE 05/18/2017CM Allison Gardner 82 year old female returns for follow-up with her 2 sons. She lives with one of them. They provide her history which is limited. She was recently hospitalized for bradycardia and migraine there was also a  question of stroke and Plavix was added to her aspirin. According to the son she was in the hospital in [place] approximately 1 week. We do not have any of those records. Some of her medications have been changed and these were updated. Her memory score is relatively stable. She now has a left foot drop and is getting some physical therapy. She walks with a cane reasonably well but she does drag her left foot. She has had one fall recently. She is currently receiving in-home services . She returns for reevaluation  REVIEW OF SYSTEMS: Full 14 system review of systems performed and notable only for those listed, all others are neg:  Constitutional: Fatigue  Cardiovascular: Leg swelling Ear/Nose/Throat: neg  Skin: neg Eyes: neg Respiratory: neg Gastroitestinal: Urinary frequency Hematology/Lymphatic: neg  Endocrine: neg Musculoskeletal: Joint pain walking difficulty Allergy/Immunology: neg Neurological: Mild cognitive impairment Psychiatric: neg Sleep : neg   ALLERGIES: Allergies  Allergen Reactions  . Ampicillin   . Demerol [Meperidine]     HOME MEDICATIONS: Outpatient Prescriptions Prior to Visit  Medication Sig Dispense Refill  . Acetaminophen (TYLENOL PO) Take by mouth.    Marland Kitchen aspirin 81 MG tablet Take 81 mg by mouth daily.    . Omega-3 Fatty Acids (FISH OIL) 1000 MG CAPS Take by mouth.    . Calcium-Vitamin D 600-200 MG-UNIT per tablet Take 1 tablet by mouth daily.    . carvedilol (COREG) 6.25 MG tablet     . vitamin E 400 UNIT capsule Take 400 Units by mouth daily.     No facility-administered medications prior to visit.    PAST MEDICAL HISTORY: Past Medical History  Diagnosis Date  . Stroke ([facility])   .  Headache(784.0)   . Kidney stones     PAST SURGICAL HISTORY: Past Surgical History  Procedure Laterality Date  . Abdominal hysterectomy    . Appendectomy      FAMILY HISTORY: No family history on file.  SOCIAL HISTORY: Social History   Social History   . Marital Status: Widowed    Spouse Name: N/A  . Number of Children: N/A  . Years of Education: N/A   Occupational History  . Not on file.   Social History Main Topics  . Smoking status: Never Smoker   . Smokeless tobacco: Never Used  . Alcohol Use: No  . Drug Use: No  . Sexual Activity: Not on file   Other Topics Concern  . Not on file   Social History Narrative   Patient lives at home her son stay with her.     PHYSICAL EXAM  Filed Vitals:   09/28/15 1245  BP: 148/72  Pulse: 72  Height: 5' (1.524 m)  Weight: 114 lb 9.6 oz (51.982 kg)   Body mass index is 22.38 kg/(m^2). General: well developed, well nourished, seated, in no evident distress. Mild kyphoscoliosis Head: head normocephalic and atraumatic.  Neck: supple with no carotid bruits Cardiovascular: regular rate and rhythm, no murmurs  Neurologic Exam Mental Status: Awake and fully alert. Oriented to place and time. Follows 1, 2 and 3 step commands speech and language appear normal Mini-Mental status exam score 22/30 with deficits in attention and recall and orientation .Animal naming test 11.   Cranial Nerves:Pupils equal, briskly reactive to light. Extraocular movements full without nystagmus. Visual fields full to confrontation. Hearing decreased bilaterally Facial sensation intact. Face, tongue, palate move normally and symmetrically. Neck flexion and extension normal.  Motor: Normal bulk and tone. Left lower extremity with increased tone and  4 out of 5 weakness. Left foot drop Sensory.: intact to touch in the upper and lower extremities Coordination: Rapid alternating movements normal in all extremities. Finger-to-nose  performed accurately bilaterally. Gait and Station: Arises from chair without difficulty. Slow steady gait mild difficulty with dragging of left  leg obvious foot drop on the left ambulates with single-point cane  Reflexes: Symmetric upper and lower Toes downgoing.  DIAGNOSTIC  DATA (LABS, IMAGING, TESTING) -   ASSESSMENT AND PLAN 82 y.o. year old female has a past medical history of Stroke [facility]); memory loss and gait abnormality here to follow-up . She had a recent admission to [facility] in [place] for bradycardia migraine headache and questionable stroke according to the son we do not have access to any of those records. The patient is a current patient of Dr. Leonie Man who is out of the office today . This note is sent to the work in doctor.   Memory score is stable Aricept stopped due to bradycardia this was not started by our clinic Continue aspirin and Plavix for secondary stroke prevention Strict control of blood pressure with systolic goal below [id number], today's reading 148-72 Continue to use cane for unsteady gait and balance issues, Order for AFO given at risk for falls Patient is currently getting physical therapy and home health services F/U in 6 months to 8 monthsNext with Dr. Leonie Man Will obtain hospital records from [place] Additional 10 minutes spent answering questions for family however it is difficult when we  don't have access to the medical information from her recent hospitalization. She needs to follow-up with her primary care provider. Vst time 40 min Dennie Bible, [facility], [facility], APRN  [facility] [address] [place], [place] [address] [phone]   Personally participated in and made any corrections needed to history, physical, neuro exam,assessment and plan as stated above, evaluated lab date, reviewed imaging studies and agree with radiology interpretations.    Sarina Ill, MD [facility]

## 2015-09-28 NOTE — Patient Instructions (Signed)
Memory score is stable Aricept stopped due to bradycardia Continue aspirin and Plavix for secondary stroke prevention Strict control of blood pressure with systolic goal below [id number],  Continue to use cane for unsteady gait and balance issues, Order for AFO given at risk for falls F/U in 6 months to 8 monthsNext with Dr. Leonie Man Will obtain hospital records from [place]

## 2015-09-28 NOTE — Progress Notes (Unsigned)
Order for

## 2015-10-02 ENCOUNTER — Telehealth: Payer: Self-pay | Admitting: *Deleted

## 2015-10-02 NOTE — Telephone Encounter (Signed)
Records received from [facility].

## 2015-10-10 ENCOUNTER — Telehealth: Payer: Self-pay | Admitting: *Deleted

## 2015-10-10 NOTE — Telephone Encounter (Signed)
Received fax confirmation for detailed order AFO, rigid anterior tibial .  [employer] [facility] [facility] [employer].

## 2015-11-01 ENCOUNTER — Telehealth: Payer: Self-pay | Admitting: Nurse Practitioner

## 2015-11-01 NOTE — Telephone Encounter (Signed)
Patient was admitted to [facility] on 09/16/2015 with chief complaint of slurred speech and right-sided weakness. MRI of the head showed chronic disease and no acute ischemia or infarct. Carotid Dopplers negative She was placed on aspirin and statin. She passed swallowing test. Admission was complicated by first and second degree heart block. Discussed pacemaker but family declined. Discharged on Plavix and aspirin and Lipitor. Aricept discontinued. To be discharged to skilled facility

## 2016-03-18 ENCOUNTER — Encounter: Payer: Self-pay | Admitting: Neurology

## 2016-03-18 ENCOUNTER — Ambulatory Visit (INDEPENDENT_AMBULATORY_CARE_PROVIDER_SITE_OTHER): Payer: Medicare HMO | Admitting: Neurology

## 2016-03-18 VITALS — BP 134/60 | HR 88 | Resp 14 | Ht 60.0 in | Wt 111.0 lb

## 2016-03-18 DIAGNOSIS — G3184 Mild cognitive impairment, so stated: Secondary | ICD-10-CM | POA: Diagnosis not present

## 2016-03-18 NOTE — Patient Instructions (Addendum)
I had a long discussion the patient and multiple family members regarding her mild age related cognitive impairment which appears to be stable. She was advised to continue aspirin and Plavix for stroke prevention and maintain strict control of hypertension with blood pressure goal below 140/90 and lipids with LDL cholesterol goal below 70 mg percent. She was also counseled to use a cane at all times for ambulation and fall and safety precautions. The patient was advised to see her primary care physician for her leg swelling. She will return for follow-up in the future in one year or call earlier if necessary.

## 2016-03-18 NOTE — Progress Notes (Signed)
[facility]  PATIENT: Allison Gardner DOB: Jan 14, 1923   REASON FOR VISIT: Mild cognitive impairment, memory loss ,history of stroke, intracranial hemorrhage HISTORY FROM: Patient    HISTORY OF PRESENT ILLNESS: HISTORY PSMs. Allison Gardner, 82 year old female returns for follow-up. She was last seen by Dr.'s Leonie Man 09/01/2013. She continues to do well from a neurovascular standpoint without recurrent stroke or TIA symptoms.Recent fall with pelvic fx. She was not using her assistive device when she fell. She still continues to have occasional positional dizziness but this is not as bothersome. She walks with a cane. She has been tolerating aspirin well with minimal bruising.Carotid Doppler after last visit was negative for significant stenosis.She continues to live independently and perform all of her ADLs. She continues to cook. She no longer drives. She returns for reevaluation  Update 03/29/2015 CM: Allison Gardner , 82 year old female returns for follow-up She is accompanied by 2 of her sons who provide history. She has a history of intercranial hemorrhage, pontine stroke and lacunar infarcts, along with mild cognitive impairment. She continues to do reasonably well. Her blood pressure is quite well controlled usually it is slightly elevated at 153/71 in office today. She lives with her son and is fairly independent in most activities of daily living. The son has to handle only her finances. She walks with a cane reasonably well though she continues to drag the left leg which is stiff and weak. She has had no falls. Her mild memory difficulties remain unchanged. She's had no new interval health problems. She returns for reevaluation UPDATE 05/18/2017CM Allison Gardner 82 year old female returns for follow-up with her 2 sons. She lives with one of them. They provide her history which is limited. She was recently hospitalized for bradycardia and migraine there was also a  question of stroke and Plavix was added to her aspirin. According to the son she was in the hospital in [place] approximately 1 week. We do not have any of those records. Some of her medications have been changed and these were updated. Her memory score is relatively stable. She now has a left foot drop and is getting some physical therapy. She walks with a cane reasonably well but she does drag her left foot. She has had one fall recently. She is currently receiving in-home services . She returns for reevaluation Update 03/18/2016 : She returns for follow-up after last visit 6 months ago. She is accompanied by her daughter and sons. She appears to be doing about the same. Her mild short-term memory difficulties appeared to be unchanged and are not progressive infection scored better 27/30 on the Mini-Mental state testing today compared to last visit when she scored 22/30. She remains on both aspirin and Plavix and tolerating it well with only minor bruising and no bleeding episodes. Her blood pressure is well controlled and is usually in the 4:85 systolic range. She remains on Crestor which is tolerating well without muscle aches and pains. She was seen recently by primary care doctor for annual physician as well as eye doctor.. She has no complaints. She is careful with her walking and uses a cane and has had no recent falls. REVIEW OF SYSTEMS: Full 14 system review of systems performed and notable only for those listed, all others are neg:  Fatigue, chills, hearing loss, light sensitive, swelling, frequency of urination, joint pain and swelling, leg swelling, restless leg, insomnia, daytime sleepiness, memory loss, itching and all other systems negative ALLERGIES: Allergies  Allergen Reactions  .  Ampicillin   . Demerol [Meperidine]     HOME MEDICATIONS: Outpatient Medications Prior to Visit  Medication Sig Dispense Refill  . Acetaminophen (TYLENOL PO) Take by mouth.    Marland Kitchen amLODipine  (NORVASC) 5 MG tablet Take 5 mg by mouth daily.  0  . aspirin 81 MG tablet Take 81 mg by mouth daily.    . butalbital-acetaminophen-caffeine (FIORICET, ESGIC) 50-325-40 MG tablet TAKE 1 TABLET BY MOUTH EVERY 6 HOURS AS NEEDED FOR MIGRAINE  0  . clopidogrel (PLAVIX) 75 MG tablet Take 75 mg by mouth daily.  0  . Omega-3 Fatty Acids (FISH OIL) 1000 MG CAPS Take by mouth.    . pantoprazole (PROTONIX) 40 MG tablet     . rosuvastatin (CRESTOR) 5 MG tablet Take 5 mg by mouth at bedtime.  6  . traMADol (ULTRAM) 50 MG tablet Take 50 mg by mouth every 6 (six) hours as needed. for pain  0   No facility-administered medications prior to visit.     PAST MEDICAL HISTORY: Past Medical History:  Diagnosis Date  . Headache(784.0)   . Kidney stones   . Memory loss   . Stroke [facility])     PAST SURGICAL HISTORY: Past Surgical History:  Procedure Laterality Date  . ABDOMINAL HYSTERECTOMY    . APPENDECTOMY      FAMILY HISTORY: No family history on file.  SOCIAL HISTORY: Social History   Social History  . Marital status: Widowed    Spouse name: N/A  . Number of children: N/A  . Years of education: N/A   Occupational History  . Not on file.   Social History Main Topics  . Smoking status: Never Smoker  . Smokeless tobacco: Never Used  . Alcohol use No  . Drug use: No  . Sexual activity: Not on file   Other Topics Concern  . Not on file   Social History Narrative   Patient lives at home her son stay with her.     PHYSICAL EXAM  Vitals:   03/18/16 1142  BP: 134/60  Pulse: 88  Resp: 14  Weight: 111 lb (50.3 kg)  Height: 5' (1.524 m)   Body mass index is 21.68 kg/m. General: well developed, well nourished, seated, in no evident distress. Mild kyphoscoliosis Head: head normocephalic and atraumatic.  Neck: supple with no carotid bruits Cardiovascular: regular rate and rhythm, no murmurs  Neurologic Exam Mental Status: Awake and fully alert. Oriented to place and time.  Follows 1, 2 and 3 step commands speech and language appear normal Mini-Mental status exam score 27/30 ( last visit22/30 )with deficits in attention  and orientation .Animal naming test 13.  Clock drawing 4/4 Cranial Nerves:Pupils equal, briskly reactive to light. Extraocular movements full without nystagmus. Visual fields full to confrontation. Hearing decreased bilaterally Facial sensation intact. Face, tongue, palate move normally and symmetrically. Neck flexion and extension normal.  Motor: Normal bulk and tone. Left lower extremity with increased tone and  4 out of 5 weakness. Left foot drop Sensory.: intact to touch in the upper and lower extremities Coordination: Rapid alternating movements normal in all extremities. Finger-to-nose  performed accurately bilaterally. Gait and Station: Arises from chair without difficulty. Slow steady gait mild difficulty with dragging of left  leg obvious foot drop on the left ambulates with single-point cane  Reflexes: Symmetric upper and lower Toes downgoing.  DIAGNOSTIC DATA (LABS, IMAGING, TESTING) -   ASSESSMENT AND PLAN 82 y.o. year old female has a past medical history of Stroke (  [facility]); memory loss and gait abnormality here to follow-up . She remains stable  I had a long discussion the patient and multiple family members regarding her mild age related cognitive impairment which appears to be stable. She was advised to continue aspirin and Plavix for stroke prevention and maintain strict control of hypertension with blood pressure goal below 140/90 and lipids with LDL cholesterol goal below 70 mg percent. She was also counseled to use a cane at all times for ambulation and fall and safety precautions. The patient was advised to see her primary care physician for her leg swelling. She will return for follow-up in the future in one year or call earlier if necessary. Antony Contras, MD [facility] [address], [place], [place] [address] [phone]   Personally participated in and made any corrections needed to history, physical, neuro exam,assessment and plan as stated above, evaluated lab date, reviewed imaging studies and agree with radiology interpretations.    Sarina Ill, MD [facility]

## 2017-03-18 ENCOUNTER — Encounter: Payer: Self-pay | Admitting: Neurology

## 2017-03-18 ENCOUNTER — Ambulatory Visit: Payer: Medicare HMO | Admitting: Neurology

## 2017-03-18 VITALS — BP 141/76 | HR 84 | Wt 95.2 lb

## 2017-03-18 DIAGNOSIS — I6990 Unspecified sequelae of unspecified cerebrovascular disease: Secondary | ICD-10-CM

## 2017-03-18 NOTE — Progress Notes (Signed)
[facility]  PATIENT: Allison Gardner DOB: [birth year]   REASON FOR VISIT: Mild cognitive impairment, memory loss ,history of stroke, intracranial hemorrhage HISTORY FROM: Patient    HISTORY OF PRESENT ILLNESS: HISTORY PSMs. Allison Gardner, 83 year old female returns for follow-up. She was last seen by Dr.'s Leonie Man 09/01/2013. She continues to do well from a neurovascular standpoint without recurrent stroke or TIA symptoms.Recent fall with pelvic fx. She was not using her assistive device when she fell. She still continues to have occasional positional dizziness but this is not as bothersome. She walks with a cane. She has been tolerating aspirin well with minimal bruising.Carotid Doppler after last visit was negative for significant stenosis.She continues to live independently and perform all of her ADLs. She continues to cook. She no longer drives. She returns for reevaluation  Update 03/29/2015 CM: Allison Gardner , 83 year old female returns for follow-up She is accompanied by 2 of her sons who provide history. She has a history of intercranial hemorrhage, pontine stroke and lacunar infarcts, along with mild cognitive impairment. She continues to do reasonably well. Her blood pressure is quite well controlled usually it is slightly elevated at 153/71 in office today. She lives with her son and is fairly independent in most activities of daily living. The son has to handle only her finances. She walks with a cane reasonably well though she continues to drag the left leg which is stiff and weak. She has had no falls. Her mild memory difficulties remain unchanged. She's had no new interval health problems. She returns for reevaluation UPDATE 05/18/2017CM Allison Gardner 83 year old female returns for follow-up with her 2 sons. She lives with one of them. They provide her history which is limited. She was recently hospitalized for bradycardia and migraine there was also a  question of stroke and Plavix was added to her aspirin. According to the son she was in the hospital in [place] approximately 1 week. We do not have any of those records. Some of her medications have been changed and these were updated. Her memory score is relatively stable. She now has a left foot drop and is getting some physical therapy. She walks with a cane reasonably well but she does drag her left foot. She has had one fall recently. She is currently receiving in-home services . She returns for reevaluation Update 03/18/2016 : She returns for follow-up after last visit 6 months ago. She is accompanied by her daughter and sons. She appears to be doing about the same. Her mild short-term memory difficulties appeared to be unchanged and are not progressive infection scored better 27/30 on the Mini-Mental state testing today compared to last visit when she scored 22/30. She remains on both aspirin and Plavix and tolerating it well with only minor bruising and no bleeding episodes. Her blood pressure is well controlled and is usually in the 9:70 systolic range. She remains on Crestor which is tolerating well without muscle aches and pains. She was seen recently by primary care doctor for annual physician as well as eye doctor.. She has no complaints. She is careful with her walking and uses a cane and has had no recent falls.   Update 03/18/2017 : She returns for follow-up after last visit with me a year ago.  She is accompanied by 2 of her daughters.  She continues to do well without recurrent stroke or TIA symptoms.  She is living at home with her daughters and there is constant supervision.  She can ambulate with  a cane she is careful she has had no major falls or injuries.  She is tolerating aspirin and Plavix with only minor bruising but no bleeding.  She was admitted in May 2018 for a few weeks to the hospital with some intestinal problems.  She also spent a few weeks and rehab.  She is still  complaining of some stomach reflux and vomiting.  She does have an appointment to see her primary care physician later later this week for the same.  Her memory difficulties remain unchanged.  There has been no new neurological issues. REVIEW OF SYSTEMS: Full 14 system review of systems performed and notable only for those listed, all others are neg:  Fatigue, chills, hearing loss, runny nose, daytime sleepiness, walking difficulty, frequency of urination, dizziness, weakness, confusion and hallucinations memory loss, itching and all other systems negative ALLERGIES: Allergies  Allergen Reactions  . Ampicillin   . Demerol [Meperidine]     HOME MEDICATIONS: Outpatient Medications Prior to Visit  Medication Sig Dispense Refill  . Acetaminophen (TYLENOL PO) Take by mouth.    Marland Kitchen amLODipine (NORVASC) 5 MG tablet Take 5 mg by mouth daily.  0  . aspirin 81 MG tablet Take 81 mg by mouth daily.    . butalbital-acetaminophen-caffeine (FIORICET, ESGIC) 50-325-40 MG tablet TAKE 1 TABLET BY MOUTH EVERY 6 HOURS AS NEEDED FOR MIGRAINE  0  . carvedilol (COREG) 6.25 MG tablet carvedilol 6.25 mg tablet    . clopidogrel (PLAVIX) 75 MG tablet Take 75 mg by mouth daily.  0  . Omega-3 Fatty Acids (FISH OIL) 1000 MG CAPS Take by mouth.    . pantoprazole (PROTONIX) 40 MG tablet     . rosuvastatin (CRESTOR) 5 MG tablet Take 5 mg by mouth at bedtime.  6  . traMADol (ULTRAM) 50 MG tablet Take 50 mg by mouth every 6 (six) hours as needed. for pain  0  . Omega-3 Fatty Acids (FISH OIL) 1000 MG CAPS Take 1,000 mg daily by mouth.    . prednisoLONE acetate (PRED FORTE) 1 % ophthalmic suspension prednisolone acetate 1 % eye drops,suspension  INSTILL ONE DROP INTO RIGHT EYE 4 TIMES A DAY (START MARCH 18TH)     No facility-administered medications prior to visit.     PAST MEDICAL HISTORY: Past Medical History:  Diagnosis Date  . Headache(784.0)   . Kidney stones   . Memory loss   . Stroke [facility])     PAST SURGICAL  HISTORY: Past Surgical History:  Procedure Laterality Date  . ABDOMINAL HYSTERECTOMY    . APPENDECTOMY      FAMILY HISTORY: History reviewed. No pertinent family history.  SOCIAL HISTORY: Social History   Socioeconomic History  . Marital status: Widowed    Spouse name: Not on file  . Number of children: Not on file  . Years of education: Not on file  . Highest education level: Not on file  Social Needs  . Financial resource strain: Not on file  . Food insecurity - worry: Not on file  . Food insecurity - inability: Not on file  . Transportation needs - medical: Not on file  . Transportation needs - non-medical: Not on file  Occupational History  . Not on file  Tobacco Use  . Smoking status: Never Smoker  . Smokeless tobacco: Never Used  Substance and Sexual Activity  . Alcohol use: No  . Drug use: No  . Sexual activity: Not on file  Other Topics Concern  . Not on file  Social History Narrative   Patient lives at home her son stay with her.     PHYSICAL EXAM  Vitals:   03/18/17 1133  BP: (!) 141/76  Pulse: 84  Weight: 95 lb 3.2 oz (43.2 kg)   Body mass index is 18.59 kg/m. General: frail elderly african american seated, in no evident distress. Mild kyphoscoliosis Head: head normocephalic and atraumatic.  Neck: supple with no carotid bruits Cardiovascular: regular rate and rhythm, no murmurs  Neurologic Exam Mental Status: Awake and fully alert. Oriented to place and time. Follows 1, 2 and 3 step commands speech and language appear normal Mini-Mental status exam score not done     last visit 27/30   Cranial Nerves:Pupils equal, briskly reactive to light. Extraocular movements full without nystagmus. Visual fields full to confrontation. Hearing decreased bilaterally Facial sensation intact. Face, tongue, palate move normally and symmetrically. Neck flexion and extension normal.  Motor: Normal bulk and tone. Left lower extremity with increased tone and  4  out of 5 weakness. Left foot drop Sensory.: intact to touch in the upper and lower extremities Coordination: Rapid alternating movements normal in all extremities. Finger-to-nose  performed accurately bilaterally. Gait and Station: Arises from chair without difficulty. Slow steady gait mild difficulty with dragging of left  leg obvious foot drop on the left ambulates with single-point cane  Reflexes: Symmetric upper and lower Toes downgoing.  DIAGNOSTIC DATA (LABS, IMAGING, TESTING) -   ASSESSMENT AND PLAN 83 y.o. year old female has a past medical history of Stroke [facility]); memory loss and gait abnormality here to follow-up . She remains stable  I had a long discussion the patient and her 2 daughters regarding her mild age related cognitive impairment which appears to be stable. She was advised to continue aspirin and Plavix for stroke prevention and maintain strict control of hypertension with blood pressure goal below 140/90 and lipids with LDL cholesterol goal below 70 mg percent. She was also counseled to use a cane at all times for ambulation and fall and safety precautions. The patient was advised to see her primary care physician for her vomitting and stomach problems.  Greater than 50% time during this 25-minute visit was spent on counseling and coordination of care about her mild memory difficulties and remote stroke and risk discussion and answering questions.  No routine scheduled f/u appointment is necessary but she may be referred back in future as needed only. Antony Contras, MD [facility] [address], [place], [place] [address] [phone]

## 2017-03-18 NOTE — Patient Instructions (Addendum)
I had a long discussion the patient and her 2 daughters regarding her mild age related cognitive impairment which appears to be stable. She was advised to continue aspirin and Plavix for stroke prevention and maintain strict control of hypertension with blood pressure goal below 140/90 and lipids with LDL cholesterol goal below 70 mg percent. She was also counseled to use a cane at all times for ambulation and fall and safety precautions. The patient was advised to see her primary care physician for her vomitting and stomach problems. No routine scheduled f/u appointment is necessary but she may be referred back in future as needed only.

## 2018-06-23 ENCOUNTER — Telehealth: Payer: Self-pay

## 2018-06-23 NOTE — Telephone Encounter (Signed)
Received a call from patient's son stating that patient was recently seen by her PCP last week and he stated that she needed to be seen by her neurologist. I advised the patient's son that since she hasn't been seen since 2018 that we would need the office notes/referral on why she needs to be seen. Patient's son was very rude and disconnected the call.

## 2018-10-07 ENCOUNTER — Telehealth: Payer: Self-pay

## 2018-10-07 NOTE — Telephone Encounter (Signed)
I tried to call Allison Gardner but number is listed the same as Allison Gardner.

## 2018-10-07 NOTE — Telephone Encounter (Signed)
I tried to call son Lavone Orn  About appt will be change to video due to COVID 19. HIs mailbox was full. Could not leave message.

## 2018-10-12 ENCOUNTER — Ambulatory Visit (INDEPENDENT_AMBULATORY_CARE_PROVIDER_SITE_OTHER): Payer: Medicare HMO | Admitting: Neurology

## 2018-10-12 ENCOUNTER — Other Ambulatory Visit: Payer: Self-pay

## 2018-10-12 DIAGNOSIS — R269 Unspecified abnormalities of gait and mobility: Secondary | ICD-10-CM

## 2018-10-12 NOTE — Progress Notes (Signed)
Virtual Visit via Video Note  I connected with Allison Gardner on 10/12/18 at  2:30 PM EDT by a video enabled telemedicine application and verified that I am speaking with the correct person using two identifiers.  Location: Patient: at her home with son Provider: at [facility] office   I discussed the limitations of evaluation and management by telemedicine and the availability of in person appointments. The patient expressed understanding and agreed to proceed.  This visit was performed using doxy.me app for audio and visual The patient's son was present throughout this visit and facilitated it History of Present Illness: Ms. Allison Gardner is a pleasant 85 year old African-American lady seen today for follow-up after her last visit on 03/18/2017.  The patient states that she has had a couple of falls and hit her head in the last 1 year.  Fortunately these have been minor and not resulted in major injury or bleeding or visit to the hospital.  She complains of intermittent headaches particularly at night.  She takes Tylenol 1 tablet which seems to get rid of the headache.  She denies any accompanying nausea, vomiting, light or sound sensitivity.  She has not had any brain imaging done for her headaches she feels that her balance is not good.  She is supposed to have a cane and a walker but does not use it consistently.  She does not go outside the house and mostly walks indoors.  She states her memory difficulties continue.  She has trouble remembering recent information at times given names of family members.  She needs help with most activities of daily living and family provides close support for her.  She has had no recurrent stroke or TIA symptoms.  She remains on aspirin and Plavix and is tolerating it well without bleeding or major bruising.   Observations/Objective: Pleasant elderly African-American lady not in distress.  Physical and neurological exam are limited secondary to constraints from video  visit.  She is awake alert oriented to time and place.  She has diminished attention, registration and recall.  Recall is 0/3.  She is able to name only 4 animals which walk on 4 legs.  Mini-Mental status exam was not done today.  Speech is clear without dysarthria or aphasia.  She follows commands well.  Extraocular movements appear full range without nystagmus.  Face is symmetric without weakness.  Tongue is midline.  Motor system exam shows symmetric antigravity strength in both upper extremities.  Gait was not tested.  Assessment and Plan: 85 year old lady with past medical history of stroke and residual memory and cognitive difficulties as well as gait and balance abnormalities which appears stable.  New complaints of intermittent posttraumatic headaches. I had a long discussion with the patient and her son regarding her new headaches and recommend she continue to use Tylenol for symptomatic relief but limited to taking not more than 4 tablets a day and not more than 2 days a week to avoid analgesic rebound.  The frequency and severity of her headache at the present time is not frequent enough to justify stronger medications.  She was advised to call me if headaches are not controlled or got worse.  Continue aspirin and Plavix for stroke prevention and maintain strict control of hypertension with blood pressure goal below 140/90 and lipids with LDL cholesterol goal below 70 mg percent. She was also counseled to use a cane at all times for ambulation and fall and safety precautions. Follow Up Instructions: Return for follow-up in a  year or call earlier if necessary.  I discussed the assessment and treatment plan with the patient. The patient was provided an opportunity to ask questions and all were answered. The patient agreed with the plan and demonstrated an understanding of the instructions.   The patient was advised to call back or seek an in-person evaluation if the symptoms worsen or if the  condition fails to improve as anticipated.  I provided 25 minutes of non-face-to-face time during this encounter.   Antony Contras, MD

## 2018-10-12 NOTE — Telephone Encounter (Signed)
I called Allison Gardner that pt visit is change to video due to COVID 19. I stated he was call and his vm was full. I stated pt is high risk an due to safety we are trying to minimize risk. I stated it will not be a in office visit but billed for video. He gave verbal consent to do video and to file insurance. He has a smart phone with camera. He confirmed cell number of [phone]. I stated he will get text message after call but to not click till 552CE. He verbalized understanding.

## 2020-05-14 ENCOUNTER — Other Ambulatory Visit: Payer: Self-pay

## 2020-05-14 ENCOUNTER — Encounter (HOSPITAL_COMMUNITY): Payer: Self-pay | Admitting: Emergency Medicine

## 2020-05-14 ENCOUNTER — Inpatient Hospital Stay (HOSPITAL_COMMUNITY)
Admission: EM | Admit: 2020-05-14 | Discharge: 2020-05-20 | DRG: 378 | Disposition: A | Discharge status: Home Health | Payer: Medicare HMO | Attending: Internal Medicine | Admitting: Internal Medicine

## 2020-05-14 DIAGNOSIS — K922 Gastrointestinal hemorrhage, unspecified: Secondary | ICD-10-CM | POA: Diagnosis present

## 2020-05-14 DIAGNOSIS — Z20822 Contact with and (suspected) exposure to covid-19: Secondary | ICD-10-CM | POA: Diagnosis present

## 2020-05-14 DIAGNOSIS — F0390 Unspecified dementia without behavioral disturbance: Secondary | ICD-10-CM | POA: Diagnosis present

## 2020-05-14 DIAGNOSIS — Z7901 Long term (current) use of anticoagulants: Secondary | ICD-10-CM

## 2020-05-14 DIAGNOSIS — D696 Thrombocytopenia, unspecified: Secondary | ICD-10-CM | POA: Diagnosis present

## 2020-05-14 DIAGNOSIS — D6959 Other secondary thrombocytopenia: Secondary | ICD-10-CM | POA: Diagnosis present

## 2020-05-14 DIAGNOSIS — I779 Disorder of arteries and arterioles, unspecified: Secondary | ICD-10-CM | POA: Diagnosis present

## 2020-05-14 DIAGNOSIS — N1832 Chronic kidney disease, stage 3b: Secondary | ICD-10-CM | POA: Diagnosis present

## 2020-05-14 DIAGNOSIS — Z90710 Acquired absence of both cervix and uterus: Secondary | ICD-10-CM

## 2020-05-14 DIAGNOSIS — K5730 Diverticulosis of large intestine without perforation or abscess without bleeding: Secondary | ICD-10-CM | POA: Diagnosis present

## 2020-05-14 DIAGNOSIS — K314 Gastric diverticulum: Secondary | ICD-10-CM | POA: Diagnosis present

## 2020-05-14 DIAGNOSIS — I10 Essential (primary) hypertension: Secondary | ICD-10-CM | POA: Diagnosis present

## 2020-05-14 DIAGNOSIS — N179 Acute kidney failure, unspecified: Secondary | ICD-10-CM | POA: Diagnosis present

## 2020-05-14 DIAGNOSIS — K5521 Angiodysplasia of colon with hemorrhage: Principal | ICD-10-CM | POA: Diagnosis present

## 2020-05-14 DIAGNOSIS — D62 Acute posthemorrhagic anemia: Secondary | ICD-10-CM | POA: Diagnosis present

## 2020-05-14 DIAGNOSIS — Z7982 Long term (current) use of aspirin: Secondary | ICD-10-CM

## 2020-05-14 DIAGNOSIS — E782 Mixed hyperlipidemia: Secondary | ICD-10-CM | POA: Diagnosis present

## 2020-05-14 DIAGNOSIS — K7460 Unspecified cirrhosis of liver: Secondary | ICD-10-CM

## 2020-05-14 DIAGNOSIS — K761 Chronic passive congestion of liver: Secondary | ICD-10-CM

## 2020-05-14 DIAGNOSIS — I5042 Chronic combined systolic (congestive) and diastolic (congestive) heart failure: Secondary | ICD-10-CM | POA: Diagnosis present

## 2020-05-14 DIAGNOSIS — I420 Dilated cardiomyopathy: Secondary | ICD-10-CM | POA: Diagnosis present

## 2020-05-14 DIAGNOSIS — K219 Gastro-esophageal reflux disease without esophagitis: Secondary | ICD-10-CM | POA: Diagnosis present

## 2020-05-14 DIAGNOSIS — I130 Hypertensive heart and chronic kidney disease with heart failure and stage 1 through stage 4 chronic kidney disease, or unspecified chronic kidney disease: Secondary | ICD-10-CM | POA: Diagnosis present

## 2020-05-14 DIAGNOSIS — E875 Hyperkalemia: Secondary | ICD-10-CM | POA: Diagnosis present

## 2020-05-14 DIAGNOSIS — E039 Hypothyroidism, unspecified: Secondary | ICD-10-CM | POA: Diagnosis present

## 2020-05-14 DIAGNOSIS — N184 Chronic kidney disease, stage 4 (severe): Secondary | ICD-10-CM | POA: Diagnosis present

## 2020-05-14 DIAGNOSIS — Z79899 Other long term (current) drug therapy: Secondary | ICD-10-CM

## 2020-05-14 DIAGNOSIS — K222 Esophageal obstruction: Secondary | ICD-10-CM | POA: Diagnosis present

## 2020-05-14 DIAGNOSIS — Z8673 Personal history of transient ischemic attack (TIA), and cerebral infarction without residual deficits: Secondary | ICD-10-CM

## 2020-05-14 DIAGNOSIS — R296 Repeated falls: Secondary | ICD-10-CM | POA: Diagnosis present

## 2020-05-14 DIAGNOSIS — Z7902 Long term (current) use of antithrombotics/antiplatelets: Secondary | ICD-10-CM

## 2020-05-14 DIAGNOSIS — L8990 Pressure ulcer of unspecified site, unspecified stage: Secondary | ICD-10-CM | POA: Insufficient documentation

## 2020-05-14 DIAGNOSIS — I739 Peripheral vascular disease, unspecified: Secondary | ICD-10-CM | POA: Diagnosis present

## 2020-05-14 HISTORY — DX: Heart failure, unspecified: I50.9

## 2020-05-14 LAB — COMPREHENSIVE METABOLIC PANEL
ALT: 10 U/L (ref 0–44)
AST: 29 U/L (ref 15–41)
Albumin: 2.8 g/dL — ABNORMAL LOW (ref 3.5–5.0)
Alkaline Phosphatase: 116 U/L (ref 38–126)
Anion gap: 9 (ref 5–15)
BUN: 27 mg/dL — ABNORMAL HIGH (ref 8–23)
CO2: 20 mmol/L — ABNORMAL LOW (ref 22–32)
Calcium: 8.7 mg/dL — ABNORMAL LOW (ref 8.9–10.3)
Chloride: 115 mmol/L — ABNORMAL HIGH (ref 98–111)
Creatinine, Ser: 1.36 mg/dL — ABNORMAL HIGH (ref 0.44–1.00)
GFR, Estimated: 36 mL/min — ABNORMAL LOW (ref >60)
Glucose, Bld: 71 mg/dL (ref 70–99)
Potassium: 5.6 mmol/L — ABNORMAL HIGH (ref 3.5–5.1)
Sodium: 144 mmol/L (ref 135–145)
Total Bilirubin: 1.4 mg/dL — ABNORMAL HIGH (ref 0.3–1.2)
Total Protein: 5.3 g/dL — ABNORMAL LOW (ref 6.5–8.1)

## 2020-05-14 LAB — CBC
HCT: 31.6 % — ABNORMAL LOW (ref 36.0–46.0)
Hemoglobin: 9.7 g/dL — ABNORMAL LOW (ref 12.0–15.0)
MCH: 29.4 pg (ref 26.0–34.0)
MCHC: 30.7 g/dL (ref 30.0–36.0)
MCV: 95.8 fL (ref 80.0–100.0)
Platelets: 88 10*3/uL — ABNORMAL LOW (ref 150–400)
RBC: 3.3 MIL/uL — ABNORMAL LOW (ref 3.87–5.11)
RDW: 18 % — ABNORMAL HIGH (ref 11.5–15.5)
WBC: 4.6 10*3/uL (ref 4.0–10.5)
nRBC: 0 % (ref 0.0–0.2)

## 2020-05-14 NOTE — ED Triage Notes (Signed)
Pt just discharged from [facility] in [place], [place].  Reports blood in stool since Monday.  Received 3 units of blood.  Family states pt was brought to [facility] because they did not have a GI doctor there.  Denies pain.

## 2020-05-15 ENCOUNTER — Encounter (HOSPITAL_COMMUNITY): Payer: Self-pay | Admitting: Internal Medicine

## 2020-05-15 DIAGNOSIS — F0390 Unspecified dementia without behavioral disturbance: Secondary | ICD-10-CM | POA: Diagnosis present

## 2020-05-15 DIAGNOSIS — K922 Gastrointestinal hemorrhage, unspecified: Secondary | ICD-10-CM | POA: Diagnosis present

## 2020-05-15 DIAGNOSIS — I739 Peripheral vascular disease, unspecified: Secondary | ICD-10-CM | POA: Diagnosis present

## 2020-05-15 DIAGNOSIS — E875 Hyperkalemia: Secondary | ICD-10-CM | POA: Diagnosis present

## 2020-05-15 DIAGNOSIS — I5042 Chronic combined systolic (congestive) and diastolic (congestive) heart failure: Secondary | ICD-10-CM | POA: Diagnosis present

## 2020-05-15 DIAGNOSIS — D62 Acute posthemorrhagic anemia: Secondary | ICD-10-CM | POA: Diagnosis not present

## 2020-05-15 DIAGNOSIS — I779 Disorder of arteries and arterioles, unspecified: Secondary | ICD-10-CM | POA: Diagnosis present

## 2020-05-15 DIAGNOSIS — D696 Thrombocytopenia, unspecified: Secondary | ICD-10-CM | POA: Diagnosis present

## 2020-05-15 DIAGNOSIS — D6959 Other secondary thrombocytopenia: Secondary | ICD-10-CM | POA: Diagnosis present

## 2020-05-15 DIAGNOSIS — I10 Essential (primary) hypertension: Secondary | ICD-10-CM

## 2020-05-15 DIAGNOSIS — N1832 Chronic kidney disease, stage 3b: Secondary | ICD-10-CM

## 2020-05-15 DIAGNOSIS — Z8673 Personal history of transient ischemic attack (TIA), and cerebral infarction without residual deficits: Secondary | ICD-10-CM | POA: Diagnosis not present

## 2020-05-15 DIAGNOSIS — Z7901 Long term (current) use of anticoagulants: Secondary | ICD-10-CM

## 2020-05-15 DIAGNOSIS — Z7902 Long term (current) use of antithrombotics/antiplatelets: Secondary | ICD-10-CM | POA: Diagnosis not present

## 2020-05-15 DIAGNOSIS — Z79899 Other long term (current) drug therapy: Secondary | ICD-10-CM | POA: Diagnosis not present

## 2020-05-15 DIAGNOSIS — R933 Abnormal findings on diagnostic imaging of other parts of digestive tract: Secondary | ICD-10-CM | POA: Insufficient documentation

## 2020-05-15 DIAGNOSIS — E039 Hypothyroidism, unspecified: Secondary | ICD-10-CM | POA: Diagnosis present

## 2020-05-15 DIAGNOSIS — I5022 Chronic systolic (congestive) heart failure: Secondary | ICD-10-CM | POA: Insufficient documentation

## 2020-05-15 DIAGNOSIS — E782 Mixed hyperlipidemia: Secondary | ICD-10-CM

## 2020-05-15 DIAGNOSIS — I420 Dilated cardiomyopathy: Secondary | ICD-10-CM | POA: Diagnosis present

## 2020-05-15 DIAGNOSIS — Z20822 Contact with and (suspected) exposure to covid-19: Secondary | ICD-10-CM | POA: Diagnosis present

## 2020-05-15 DIAGNOSIS — N179 Acute kidney failure, unspecified: Secondary | ICD-10-CM | POA: Diagnosis present

## 2020-05-15 DIAGNOSIS — N184 Chronic kidney disease, stage 4 (severe): Secondary | ICD-10-CM | POA: Diagnosis present

## 2020-05-15 DIAGNOSIS — Z7982 Long term (current) use of aspirin: Secondary | ICD-10-CM | POA: Diagnosis not present

## 2020-05-15 DIAGNOSIS — K222 Esophageal obstruction: Secondary | ICD-10-CM | POA: Diagnosis present

## 2020-05-15 DIAGNOSIS — I130 Hypertensive heart and chronic kidney disease with heart failure and stage 1 through stage 4 chronic kidney disease, or unspecified chronic kidney disease: Secondary | ICD-10-CM | POA: Diagnosis present

## 2020-05-15 DIAGNOSIS — K7460 Unspecified cirrhosis of liver: Secondary | ICD-10-CM | POA: Diagnosis present

## 2020-05-15 DIAGNOSIS — K219 Gastro-esophageal reflux disease without esophagitis: Secondary | ICD-10-CM | POA: Diagnosis present

## 2020-05-15 DIAGNOSIS — Z90710 Acquired absence of both cervix and uterus: Secondary | ICD-10-CM | POA: Diagnosis not present

## 2020-05-15 DIAGNOSIS — K314 Gastric diverticulum: Secondary | ICD-10-CM | POA: Diagnosis present

## 2020-05-15 DIAGNOSIS — K5521 Angiodysplasia of colon with hemorrhage: Secondary | ICD-10-CM | POA: Diagnosis present

## 2020-05-15 DIAGNOSIS — K7469 Other cirrhosis of liver: Secondary | ICD-10-CM | POA: Diagnosis not present

## 2020-05-15 HISTORY — DX: Long term (current) use of anticoagulants: Z79.01

## 2020-05-15 HISTORY — DX: Disorder of arteries and arterioles, unspecified: I77.9

## 2020-05-15 HISTORY — DX: Chronic systolic (congestive) heart failure: I50.22

## 2020-05-15 HISTORY — DX: Chronic combined systolic (congestive) and diastolic (congestive) heart failure: I50.42

## 2020-05-15 HISTORY — DX: Dilated cardiomyopathy: I42.0

## 2020-05-15 HISTORY — DX: Mixed hyperlipidemia: E78.2

## 2020-05-15 HISTORY — DX: Gastrointestinal hemorrhage, unspecified: K92.2

## 2020-05-15 HISTORY — DX: Acute posthemorrhagic anemia: D62

## 2020-05-15 LAB — COMPREHENSIVE METABOLIC PANEL
ALT: 9 U/L (ref 0–44)
AST: 20 U/L (ref 15–41)
Albumin: 2.5 g/dL — ABNORMAL LOW (ref 3.5–5.0)
Alkaline Phosphatase: 108 U/L (ref 38–126)
Anion gap: 6 (ref 5–15)
BUN: 27 mg/dL — ABNORMAL HIGH (ref 8–23)
CO2: 20 mmol/L — ABNORMAL LOW (ref 22–32)
Calcium: 8.2 mg/dL — ABNORMAL LOW (ref 8.9–10.3)
Chloride: 115 mmol/L — ABNORMAL HIGH (ref 98–111)
Creatinine, Ser: 1.36 mg/dL — ABNORMAL HIGH (ref 0.44–1.00)
GFR, Estimated: 36 mL/min — ABNORMAL LOW (ref >60)
Glucose, Bld: 75 mg/dL (ref 70–99)
Potassium: 4.8 mmol/L (ref 3.5–5.1)
Sodium: 141 mmol/L (ref 135–145)
Total Bilirubin: 1.2 mg/dL (ref 0.3–1.2)
Total Protein: 4.6 g/dL — ABNORMAL LOW (ref 6.5–8.1)

## 2020-05-15 LAB — CBC
HCT: 24.3 % — ABNORMAL LOW (ref 36.0–46.0)
HCT: 25.9 % — ABNORMAL LOW (ref 36.0–46.0)
HCT: 25.8 % — ABNORMAL LOW (ref 36.0–46.0)
Hemoglobin: 7.9 g/dL — ABNORMAL LOW (ref 12.0–15.0)
Hemoglobin: 7.9 g/dL — ABNORMAL LOW (ref 12.0–15.0)
Hemoglobin: 7.6 g/dL — ABNORMAL LOW (ref 12.0–15.0)
MCH: 30.7 pg (ref 26.0–34.0)
MCH: 29.8 pg (ref 26.0–34.0)
MCH: 30 pg (ref 26.0–34.0)
MCHC: 32.5 g/dL (ref 30.0–36.0)
MCHC: 30.5 g/dL (ref 30.0–36.0)
MCHC: 29.5 g/dL — ABNORMAL LOW (ref 30.0–36.0)
MCV: 94.6 fL (ref 80.0–100.0)
MCV: 97.7 fL (ref 80.0–100.0)
MCV: 102 fL — ABNORMAL HIGH (ref 80.0–100.0)
Platelets: 137 10*3/uL — ABNORMAL LOW (ref 150–400)
Platelets: 150 10*3/uL (ref 150–400)
Platelets: 141 10*3/uL — ABNORMAL LOW (ref 150–400)
RBC: 2.57 MIL/uL — ABNORMAL LOW (ref 3.87–5.11)
RBC: 2.65 MIL/uL — ABNORMAL LOW (ref 3.87–5.11)
RBC: 2.53 MIL/uL — ABNORMAL LOW (ref 3.87–5.11)
RDW: 17.9 % — ABNORMAL HIGH (ref 11.5–15.5)
RDW: 18.2 % — ABNORMAL HIGH (ref 11.5–15.5)
RDW: 18.1 % — ABNORMAL HIGH (ref 11.5–15.5)
WBC: 4.2 10*3/uL (ref 4.0–10.5)
WBC: 3.8 10*3/uL — ABNORMAL LOW (ref 4.0–10.5)
WBC: 3.5 10*3/uL — ABNORMAL LOW (ref 4.0–10.5)
nRBC: 0 % (ref 0.0–0.2)
nRBC: 0 % (ref 0.0–0.2)
nRBC: 0 % (ref 0.0–0.2)

## 2020-05-15 LAB — RESP PANEL BY RT-PCR (FLU A&B, COVID) ARPGX2
Influenza A by PCR: NEGATIVE
Influenza B by PCR: NEGATIVE
SARS Coronavirus 2 by RT PCR: NEGATIVE

## 2020-05-15 LAB — PROTIME-INR
INR: 1.3 — ABNORMAL HIGH (ref 0.8–1.2)
Prothrombin Time: 15.2 seconds (ref 11.4–15.2)

## 2020-05-15 LAB — ABO/RH: ABO/RH(D): O POS

## 2020-05-15 LAB — TSH: TSH: 0.614 u[IU]/mL (ref 0.350–4.500)

## 2020-05-15 MED ORDER — ACETAMINOPHEN 650 MG RE SUPP: 650.0000 mg | Freq: Four times a day (QID) | RECTAL | Status: DC | PRN

## 2020-05-15 MED ORDER — ONDANSETRON HCL 4 MG/2ML IJ SOLN: 4.0000 mg | Freq: Four times a day (QID) | INTRAMUSCULAR | Status: DC | PRN

## 2020-05-15 MED ORDER — PEG 3350-KCL-NABCB-NACL-NASULF 236 G PO SOLR
4000.0000 mL | Freq: Once | ORAL | Status: AC
  Administered 2020-05-15: 4000 mL via ORAL
  Filled 2020-05-15: qty 4000

## 2020-05-15 MED ORDER — SODIUM CHLORIDE 0.9 % IV SOLN: 50.0000 ug/h | INTRAVENOUS | Status: DC

## 2020-05-15 MED ORDER — SODIUM CHLORIDE 0.9 % IV SOLN: INTRAVENOUS | Status: DC

## 2020-05-15 MED ORDER — SODIUM CHLORIDE 0.9 % IV SOLN: INTRAVENOUS | Status: AC

## 2020-05-15 MED ORDER — PEG 3350-KCL-NABCB-NACL-NASULF 236 G PO SOLR: 4000.0000 mL | Freq: Once | ORAL | Status: DC

## 2020-05-15 MED ORDER — ROSUVASTATIN CALCIUM 5 MG PO TABS
5.0000 mg | ORAL_TABLET | Freq: Every day | ORAL | Status: DC
  Administered 2020-05-16 – 2020-05-19 (×4): 5 mg via ORAL
  Filled 2020-05-15 (×4): qty 1

## 2020-05-15 MED ORDER — SODIUM CHLORIDE 0.9 % IV BOLUS
1000.0000 mL | Freq: Once | INTRAVENOUS | Status: AC
  Administered 2020-05-15: 500 mL via INTRAVENOUS

## 2020-05-15 MED ORDER — OCTREOTIDE LOAD VIA INFUSION: 50.0000 ug | Freq: Once | INTRAVENOUS | Status: DC

## 2020-05-15 MED ORDER — ACETAMINOPHEN 325 MG PO TABS: 650.0000 mg | ORAL_TABLET | Freq: Four times a day (QID) | ORAL | Status: DC | PRN

## 2020-05-15 MED ORDER — ONDANSETRON HCL 4 MG PO TABS: 4.0000 mg | ORAL_TABLET | Freq: Four times a day (QID) | ORAL | Status: DC | PRN

## 2020-05-15 MED ORDER — PANTOPRAZOLE SODIUM 40 MG IV SOLR
40.0000 mg | Freq: Two times a day (BID) | INTRAVENOUS | Status: DC
  Administered 2020-05-15 – 2020-05-16 (×4): 40 mg via INTRAVENOUS
  Filled 2020-05-15 (×3): qty 40

## 2020-05-15 MED ORDER — PEG-KCL-NACL-NASULF-NA ASC-C 100 G PO SOLR: 1.0000 | Freq: Once | ORAL | Status: DC

## 2020-05-15 NOTE — Progress Notes (Addendum)
Patient placed in observation after midnight by Dr. Cyd Silence.  Appears patient spent several days at [facility] awaiting transfer to a facility for a GI eval.  She and family left AMA from there and presented to [facility].  Patient is on plavix by Dr. Leonie Man for CVA prevention.  Appears also to be on eliquis but upon chart review this is unclear as to why she is on this medication.  Her last echo was 9/21 and EF is 30-35%.  BP low but patient appears to be asymptomatic.  Await SDU bed.  GI consult appreciated.  Patient would benefit from a [place] conversation with her multiple medical issues-- still a full code. Large stack of medical records from [facility] at bedside. Eulogio Bear DO

## 2020-05-15 NOTE — ED Notes (Signed)
Placed patient  On a external cath patient is resting with call bell in reach and family at bedside

## 2020-05-15 NOTE — H&P (Signed)
History and Physical    DEMONI PARMAR YDX:[id number] DOB: 24-Jul-1922 DOA: 05/14/2020  PCP: Andres Shad, MD  Patient coming from: Home in [place] Complaint:  Chief Complaint  Patient presents with  . GI Bleeding     HPI:   87 year old female with past medical history of intracranial hemorrhage (2012), multiple ischemic strokes in the past, hypertension, peripheral vascular disease, systolic and diastolic congestive heart failure ( Echo 01/2020 EF 30-35%), cirrhosis (felt to be due to CHF), hyperlipidemia, gastroesophageal reflux disease, mild cognitive impairment, migraine headaches who presents to [facility] emergency department due to complaints of frequent dark bloody stools.  Patient is an extremely poor historian due to known history of mild cognitive impairment and therefore the majority the history has been obtained from the daughter who is at the bedside in addition to review of emergency department records from [facility] in [place].  Patient resides in [place] with one of her daughters.  The remaining 7 of her children live elsewhere with several of them living in [facility].  According to the daughter, approximately 8 days ago patient began to exhibit frequent bouts of dark bloody stools.  These black bloody stools have been occurring essentially daily since onset.  Symptoms continue to worsen in the days that followed and became associated with generalized weakness, fatigue and dyspnea on exertion.  Patient also experienced occasional episodes of chest discomfort although further details could not be obtained.  The symptoms continue to worsen until the patient eventually presented to [facility] emergency department on 12/30.  Upon arrival, patient was noted to be hypotensive with initial hemoglobin of 5.3.  Patient was managed with intravenous Protonix, intravenous octreotide infusion, and 3 units of packed red  blood cell transfusion.  Patient clinically stabilized with systolic blood pressures remaining essentially in the 90s throughout the emergency department stay.    Emergency department medical records state that attempts were made to transfer the patient to another healthcare facility with gastroenterology coverage.  Unfortunately, after waiting for nearly 4 days no availability could be found in the surrounding hospitals.  Emergency department records note that the patient and family then left the emergency department Rule.  This account differs from what the daughter tells me.  She states that they were discharged from the emergency department and were told to find care elsewhere.  She states that they requested transfer to another facility but were told by emergency department providers that it was "illegal" for her mother to be transferred to another facility.   Upon arrival to [facility] emergency department, patient is continuing to experience bouts of dark bloody stools.  Patient is complaining of vague left lower quadrant pain.  Hemoglobin upon arrival to the emergency department is 9.7.  The hospitalist group is now been called to assess the patient for mission the hospital.       Review of Systems:   Review of Systems  Respiratory: Positive for shortness of breath.   Cardiovascular: Positive for chest pain.  Gastrointestinal: Positive for abdominal pain, blood in stool and melena.  Neurological: Positive for weakness.  All other systems reviewed and are negative.   Past Medical History:  Diagnosis Date  . CHF (congestive heart failure) ([place])   . Dilated cardiomyopathy ([place]) 05/15/2020  . Headache(784.0)   . Kidney stones   . Memory loss   . Stroke [facility])     Past Surgical History:  Procedure Laterality  Date  . ABDOMINAL HYSTERECTOMY    . APPENDECTOMY       reports that she has never smoked. She has never used smokeless tobacco. She reports that  she does not drink alcohol and does not use drugs.  Allergies  Allergen Reactions  . Ampicillin   . Demerol [Meperidine]     History reviewed. No pertinent family history.   Prior to Admission medications   Medication Sig Start Date End Date Taking? Authorizing Provider  Acetaminophen (TYLENOL PO) Take by mouth.    [provider]  amLODipine (NORVASC) 5 MG tablet Take 5 mg by mouth daily. 09/22/15   [provider]  aspirin 81 MG tablet Take 81 mg by mouth daily.    [provider]  butalbital-acetaminophen-caffeine (FIORICET, ESGIC) 50-325-40 MG tablet TAKE 1 TABLET BY MOUTH EVERY 6 HOURS AS NEEDED FOR MIGRAINE 09/22/15   [provider]  carvedilol (COREG) 6.25 MG tablet carvedilol 6.25 mg tablet    [provider]  clopidogrel (PLAVIX) 75 MG tablet Take 75 mg by mouth daily. 09/22/15   [provider]  Omega-3 Fatty Acids (FISH OIL) 1000 MG CAPS Take by mouth.    [provider]  pantoprazole (PROTONIX) 40 MG tablet  09/25/15   [provider]  rosuvastatin (CRESTOR) 5 MG tablet Take 5 mg by mouth at bedtime. 08/31/15   [provider]  traMADol (ULTRAM) 50 MG tablet Take 50 mg by mouth every 6 (six) hours as needed. for pain 09/22/15   [provider]    Physical Exam: Vitals:   05/15/20 0037 05/15/20 0141 05/15/20 0215 05/15/20 0230  BP:  94/70 106/65 (!) 102/45  Pulse: 82 92  79  Resp: (!) 22 (!) 21 (!) 24 20  Temp:      TempSrc:      SpO2: 100% 98% 99% 99%    Constitutional: Acute alert and oriented x3, no associated distress.   Skin: no rashes, no lesions, extremely poor skin turgor noted.   Eyes: Pupils are equally reactive to light.  No evidence of scleral icterus or conjunctival pallor.  ENMT: Moist mucous membranes noted.  Posterior pharynx clear of any exudate or lesions.   Neck: normal, supple, no masses, no thyromegaly.  No evidence of jugular venous distension.   Respiratory:  clear to auscultation bilaterally, no wheezing, no crackles. Normal respiratory effort. No accessory muscle use.  Cardiovascular: Regular rate and rhythm, no murmurs / rubs / gallops. No extremity edema. 2+ pedal pulses. No carotid bruits.  Chest:   Nontender without crepitus or deformity.   Back:   Nontender without crepitus or deformity. Abdomen: Notable left lower quadrant tenderness.  Abdomen is soft however.   No evidence of intra-abdominal masses.  Positive bowel sounds noted in all quadrants.   Musculoskeletal: No joint deformity upper and lower extremities. Good ROM, no contractures. Normal muscle tone.  Neurologic: Notable cognitive deficit on examination.  Patient is lethargic but arousable and oriented.  CN 2-12 grossly intact. Sensation intact.  Patient moving all 4 extremities spontaneously.  Patient is following all commands.  Patient is responsive to verbal stimuli.   Psychiatric: Patient exhibits normal mood with flat affect.  Patient does not seem to currently possess insight as to her current situation.   Labs on Admission: I have personally reviewed following labs and imaging studies -   CBC: Recent Labs  Lab 05/14/20 2107  WBC 4.6  HGB 9.7*  HCT 31.6*  MCV 95.8  PLT 88*  Basic Metabolic Panel: Recent Labs  Lab 05/14/20 2107  NA 144  K 5.6*  CL 115*  CO2 20*  GLUCOSE 71  BUN 27*  CREATININE 1.36*  CALCIUM 8.7*   GFR: CrCl cannot be calculated (Unknown ideal weight.). Liver Function Tests: Recent Labs  Lab 05/14/20 2107  AST 29  ALT 10  ALKPHOS 116  BILITOT 1.4*  PROT 5.3*  ALBUMIN 2.8*   No results for input(s): LIPASE, AMYLASE in the last 168 hours. No results for input(s): AMMONIA in the last 168 hours. Coagulation Profile: No results for input(s): INR, PROTIME in the last 168 hours. Cardiac Enzymes: No results for input(s): CKTOTAL, CKMB, CKMBINDEX, TROPONINI in the last 168 hours. BNP (last 3 results) No results for input(s): PROBNP in  the last 8760 hours. HbA1C: No results for input(s): HGBA1C in the last 72 hours. CBG: No results for input(s): GLUCAP in the last 168 hours. Lipid Profile: No results for input(s): CHOL, HDL, LDLCALC, TRIG, CHOLHDL, LDLDIRECT in the last 72 hours. Thyroid Function Tests: No results for input(s): TSH, T4TOTAL, FREET4, T3FREE, THYROIDAB in the last 72 hours. Anemia Panel: No results for input(s): VITAMINB12, FOLATE, FERRITIN, TIBC, IRON, RETICCTPCT in the last 72 hours. Urine analysis:    Component Value Date/Time   COLORURINE YELLOW 12/22/2008 0404   APPEARANCEUR CLEAR 12/22/2008 0404   LABSPEC 1.018 12/22/2008 0404   PHURINE 5.0 12/22/2008 0404   GLUCOSEU NEGATIVE 12/22/2008 0404   HGBUR NEGATIVE 12/22/2008 0404   BILIRUBINUR NEGATIVE 12/22/2008 0404   KETONESUR NEGATIVE 12/22/2008 0404   PROTEINUR NEGATIVE 12/22/2008 0404   UROBILINOGEN 0.2 12/22/2008 0404   NITRITE NEGATIVE 12/22/2008 0404   LEUKOCYTESUR NEGATIVE 12/22/2008 0404    Radiological Exams on Admission - Personally Reviewed: No results found.  EKG: Personally reviewed.  Rhythm is normal sinus rhythm with heart rate of 82 bpm.  Evidence of left bundle branch block.  No dynamic ST segment changes appreciated.  Assessment/Plan Principal Problem:   Acute blood loss anemia   Patient presenting with approximately a day history of intermittent bouts of dark bloody stools  Patient regularly taking clopidogrel for history of stroke as well as Xarelto for unknown reasons  Exam reveals left lower quadrant tenderness  Patient is most likely suffering from a diverticular bleed however considering recent diagnosis of cirrhosis of the liver (see H&P 03/15/2020 with outside records) variceal bleed or even bleeding from portal hypertensive gastropathy are also possible  Patient will be kept n.p.o.  Patient is already received a 500 cc bolus of normal saline by the emergency department.  Will provide gentle intravenous  hydration going forward to avoid volume overload  Providing patient with intravenous Protonix every 12  I do not believe that octreotide infusion is necessary at this point  Performing CBCs every 6 hours  Will initiate packed red blood cell transfusion if hemoglobin drops below 7.  Patient has already received 3 units at the previous facility.  Placing request for nonurgent GI consultation with Dr. Lyndel Safe via secure chat  Active Problems:   Chronic anticoagulation   Patient is on chronic Plavix therapy for history of stroke and peripheral vascular disease  Patient is additionally on Xarelto chronically.  Daughter states that it is "for her heart" however based on my thorough review of this large volume of outside facility records including several notes by cardiology I cannot seem to identify the reason why the patient is on this medication.  There is one mention from an older note of paroxysmal  atrial fibrillation however some of the more recent notes in the past several months specifically state patient does not have atrial fibrillation.    Mixed hyperlipidemia   Continue home regimen of statin therapy    Chronic combined systolic and diastolic congestive heart failure (HCC)   Advanced congestive heart failure with last ejection fraction of 30 to 20% with complications of congestive cirrhosis  Patient is at high risk of volume overload.  500 cc bolus of isotonic fluids provided in the emergency department.  Providing patient with only gentle hydration with 50 cc/h of isotonic fluids at this time while patient is n.p.o. and hypotensive    Hypothyroidism   Multiple mentions of hypothyroidism and Synthroid in outside facility record however patient is not actively on Synthroid on most recent medication list  Will obtain TSH, attempt to obtain clarity about this from family in the morning.    Thrombocytopenia ([place])   Patient exhibiting moderate thrombocytopenia, presumably  from recent diagnosis of cirrhosis felt to be secondary to patient's advanced congestive heart failure  With platelet count of 88 platelet transfusion is not necessary at this time.    Chronic kidney disease, stage 3b ([place])   Review of outside records reveals that baseline creatinine is likely 1.5  Creatinine is currently at baseline, albeit complicated by mild hyperkalemia  Strict input and output monitoring  Performing serial chemistries to monitor renal function and electrolytes    GERD (gastroesophageal reflux disease)   Patient already on IV PPI  Hyper kalemia   Mild hyperkalemia noted on initial chemistry likely secondary to decreased renal function  Patient is already received intravenous fluids here in the emergency department which may be enough to resolve her hyperkalemia, will avoid Veltassa or Lokelma at this point and consider providing patient with this if hyperkalemia persists on next chemistry   No evidence of manifestations of hyperkalemia on EKG   Continue to monitor patient on telemetry     Code Status:  Full code Family Communication: Daughter is at bedside who has been updated on plan of care  Status is: Observation  The patient remains OBS appropriate and will d/c before 2 midnights.  Dispo: The patient is from: Home              Anticipated d/c is to: Home              Anticipated d/c date is: 2 days              Patient currently is not medically stable to d/c.        Vernelle Emerald MD [facility] Pager [phone]  If 7PM-7AM, please contact night-coverage [email or web address] Use universal [place] password for that web site. If you do not have the password, please call the hospital operator.  05/15/2020, 4:07 AM

## 2020-05-15 NOTE — Consult Note (Signed)
Referring Provider: Dr. Cyd Silence  Primary Care Physician:  Andres Shad, MD Primary Gastroenterologist:  Mayo Ao   Reason for Consultation:  Anemia, GI bleed  HPI: Allison Gardner is a 87 y.o. female with a past medical history of hypertension, systolic and diastolic CHF, EF 76% per ECHO 01/2020, intracranial hemorrhage 2012, multiple ischemic stokes on Xarelto and Plavix,  weight loss/early dementia, GERD, elevated alk phos/LFTs,  cirrhosis (possible congestive hepatopathy).   She presented to [facility] ED in [place] 12/29 due to passing dark bloody stools which started on 05/07/2020.  Records from [facility] ED confirmed a hemoglobin level of 5.3.  She was hypotensive. She received 3 units of packed red blood cells.  She was placed on Protonix IV and Octreotide IV infusion.  Chest xray showed bilateral pulmonary infiltrates.  An abdominal/pelvic CT with contrast 05/11/2020 showed a curvilinear hyperdensity involving the posterior lateral right cecum adjacent to the middle aspect of the ileocecal valve, this is nonspecific however may reflect active GI bleed with extravasation of contrast.  Colonic diverticulosis without evidence of diverticulitis.  She was clinically stable and plans were to transfer her to another healthcare facility with gastroenterology coverage, however, after waiting 4 days without a transfer she and her family left AMA. They presented to [facility] ED 05/14/2020 for further evaluation and GI management.  ED course: Sodium 144.  Potassium 5.6.  Glucose 71.  BUN 27.  Creatinine 1.36.  Anion gap 9.  Alk phos 116.  Albumin 2.8.  AST 29.  ALT 29.  Total bili 1.4.  WBC 4.6.  Hemoglobin 9.7.  Repeat hemoglobin at 05 20 8 AM was 7.9.  Hematocrit 31.6.  MCV 95.8.  Platelet 88.  INR 1.3.  SARS coronavirus 2 negative.  Influenza a and B negative.   Currently, the patient is awake. She denies having any N/V. No abdominal pain. She is unable to provide any of  her past history due to having dementia. She passed dark red blood clots per the rectum overnight as reported by her RN. No further rectal bleeding or passage of blood clots per the rectum thus far today. On rectal exam, a moderate amount of dark red blood was identified. No mass or stool. She is hemodynamically stable. I called her son Allison Gardner who lives in [place]. He stated his mother lives with him. He provides her with all of her medications. He stated she last took Clopidogrel and Xarelto on Wed 12/29. His mother did not demonstrate any N/V, heartburn or dysphagia prior to her hospital admission. No family history or esophageal, gastric or colorectal cancer.   Abdominal/pelvic CT with contrast 05/11/2020: The liver is cirrhotic.  No enhancing masses. Gallbladder appears relatively unremarkable without calcified stones or suspicious right upper quadrant stranding. Pancreas is normal. No ascites. No bowel obstruction.  Scattered colonic diverticulosis is present.  There is curvilinear hyperdensity involving the posterior right cecum adjacent to the medial aspect of the ileocecal valve seen best on axial series 2, image 130.  There is slight hyperdense fluid distally within the rectum with air-fluid level.   Past Medical History:  Diagnosis Date  . CHF (congestive heart failure) (HCC)    EF: 30-35% in 9/21  . Dilated cardiomyopathy ([place]) 05/15/2020  . Headache(784.0)   . Kidney stones   . Memory loss   . Stroke [facility])     Past Surgical History:  Procedure Laterality Date  . ABDOMINAL HYSTERECTOMY    . APPENDECTOMY  Prior to Admission medications   Medication Sig Start Date End Date Taking? Authorizing Provider  acetaminophen (TYLENOL) 325 MG tablet Take 650 mg by mouth every 6 (six) hours as needed (for pain).   Yes [provider]  acetaminophen-codeine (TYLENOL #3) 300-30 MG tablet Take 1 tablet by mouth every 4 (four) hours as needed for moderate pain.   Yes [provider]  carvedilol (COREG) 3.125 MG tablet Take 3.125 mg by mouth daily.   Yes [provider]  ENTRESTO 24-26 MG Take 1 tablet by mouth 2 (two) times daily. 04/18/20  Yes [provider]  furosemide (LASIX) 20 MG tablet Take 20 mg by mouth 2 (two) times daily. 02/07/20  Yes [provider]  levothyroxine (SYNTHROID) 25 MCG tablet Take 25 mcg by mouth daily. 03/06/20  Yes [provider]  Omega-3 Fatty Acids (FISH OIL) 1000 MG CAPS Take 1,000 mg by mouth every other day.   Yes [provider]  Rivaroxaban (XARELTO) 15 MG TABS tablet Take 15 mg by mouth daily. 02/07/20  Yes [provider]  rosuvastatin (CRESTOR) 5 MG tablet Take 5 mg by mouth at bedtime. 08/31/15  Yes [provider]    Current Facility-Administered Medications  Medication Dose Route Frequency Provider Last Rate Last Admin  . 0.9 %  sodium chloride infusion   Intravenous Continuous Shalhoub, Sherryll Burger, MD 50 mL/hr at 05/15/20 0402 Restarted at 05/15/20 0402  . acetaminophen (TYLENOL) tablet 650 mg  650 mg Oral Q6H PRN Shalhoub, Sherryll Burger, MD       Or  . acetaminophen (TYLENOL) suppository 650 mg  650 mg Rectal Q6H PRN Shalhoub, Sherryll Burger, MD      . ondansetron [facility]) tablet 4 mg  4 mg Oral Q6H PRN Shalhoub, Sherryll Burger, MD       Or  . ondansetron [facility]) injection 4 mg  4 mg Intravenous Q6H PRN Shalhoub, Sherryll Burger, MD      . pantoprazole (PROTONIX) injection 40 mg  40 mg Intravenous Q12H Shalhoub, Sherryll Burger, MD      . rosuvastatin (CRESTOR) tablet 5 mg  5 mg Oral QHS Shalhoub, Sherryll Burger, MD       Current Outpatient Medications  Medication Sig Dispense Refill  . acetaminophen (TYLENOL) 325 MG tablet Take 650 mg by mouth every 6 (six) hours as needed (for pain).    Marland Kitchen acetaminophen-codeine (TYLENOL #3) 300-30 MG tablet Take 1 tablet by mouth every 4 (four) hours as needed for moderate pain.    . carvedilol (COREG) 3.125 MG tablet Take 3.125 mg by mouth daily.    Marland Kitchen  ENTRESTO 24-26 MG Take 1 tablet by mouth 2 (two) times daily.    . furosemide (LASIX) 20 MG tablet Take 20 mg by mouth 2 (two) times daily.    Marland Kitchen levothyroxine (SYNTHROID) 25 MCG tablet Take 25 mcg by mouth daily.    . Omega-3 Fatty Acids (FISH OIL) 1000 MG CAPS Take 1,000 mg by mouth every other day.    . Rivaroxaban (XARELTO) 15 MG TABS tablet Take 15 mg by mouth daily.    . rosuvastatin (CRESTOR) 5 MG tablet Take 5 mg by mouth at bedtime.  6    Allergies as of 05/14/2020 - Review Complete 10/12/2018  Allergen Reaction Noted  . Ampicillin  09/03/2012  . Demerol [meperidine]  09/03/2012    History reviewed. No pertinent family history.  Social History   Socioeconomic History  . Marital status: Widowed    Spouse  name: Not on file  . Number of children: Not on file  . Years of education: Not on file  . Highest education level: Not on file  Occupational History  . Not on file  Tobacco Use  . Smoking status: Never Smoker  . Smokeless tobacco: Never Used  Substance and Sexual Activity  . Alcohol use: No  . Drug use: No  . Sexual activity: Not on file  Other Topics Concern  . Not on file  Social History Narrative   Patient lives at home her son stay with her.   Social Determinants of Health   Financial Resource Strain: Not on file  Food Insecurity: Not on file  Transportation Needs: Not on file  Physical Activity: Not on file  Stress: Not on file  Social Connections: Not on file  Intimate Partner Violence: Not on file    Review of Systems: Unable to obtain ROS as patient has dementia.   Physical Exam: Vital signs in last 24 hours: Temp:  [98.6 F (37 C)] 98.6 F (37 C) (01/02 1802) Pulse Rate:  [60-92] 73 (01/03 0730) Resp:  [16-34] 17 (01/03 0830) BP: (91-106)/(42-79) 98/42 (01/03 0830) SpO2:  [98 %-100 %] 100 % (01/03 0830) Weight:  [43.2 kg] 43.2 kg (01/03 0615)   General: Alert 87 year old female, conversant in no acute distress. Head:  Normocephalic and  atraumatic. Eyes:  No scleral icterus. Conjunctiva pink. Ears:  Normal auditory acuity. Nose:  No deformity, discharge or lesions. Mouth: Missing teeth.  No ulcers or lesions.  Neck:  Supple. No lymphadenopathy or thyromegaly.  Lungs: Fine crackles left lower base when turned on the left side. Heart: Regular rate and rhythm, no murmurs. Abdomen: Soft, nondistended.  Mild periumbilical tenderness which radiates to the central lower abdomen without rebound or guarding.  Positive bowel sounds all 4 quadrants.  No HSM.  Lower midline abdominal scar intact. Rectal: A moderate amount of dark red blood was on the exam glove.  No mass.  No stool. Musculoskeletal:  Symmetrical without Gardner deformities.  Pulses:  Normal pulses noted. Extremities:  Without clubbing or edema. Neurologic:  Alert and  oriented x 1 Skin:  Intact without significant lesions or rashes. Psych:  Alert and cooperative. Normal mood and affect.  Intake/Output from previous day: No intake/output data recorded. Intake/Output this shift: No intake/output data recorded.  Lab Results: Recent Labs    05/14/20 2107 05/15/20 0528  WBC 4.6 4.2  HGB 9.7* 7.9*  HCT 31.6* 24.3*  PLT 88* 137*   BMET Recent Labs    05/14/20 2107 05/15/20 0528  NA 144 141  K 5.6* 4.8  CL 115* 115*  CO2 20* 20*  GLUCOSE 71 75  BUN 27* 27*  CREATININE 1.36* 1.36*  CALCIUM 8.7* 8.2*   LFT Recent Labs    05/15/20 0528  PROT 4.6*  ALBUMIN 2.5*  AST 20  ALT 9  ALKPHOS 108  BILITOT 1.2   PT/INR Recent Labs    05/15/20 0528  LABPROT 15.2  INR 1.3*   Hepatitis Panel No results for input(s): HEPBSAG, HCVAB, HEPAIGM, HEPBIGM in the last 72 hours.    Studies/Results: No results found.  IMPRESSION/PLAN:  70.  87 year old female admitted to the hospital with hematochezia and anemia. Initially presented to [facility], Hg 5.3. She received 3 units of PRBCs. An abdominal/pelvic CT with contrast 05/11/2020 done at [facility]   showed a curvilinear hyperdensity involving the posterior lateral right cecum adjacent to the middle aspect of the ileocecal valve,  this is nonspecific however may reflect active GI bleed with extravasation of contrast.  Colonic diverticulosis without evidence of diverticulitis. Most likely had a diverticular bleed. Patient left [facility] with family and presented to [facility] ED 05/14/2020 for further GI management. Hg 9.7. Patient passed dark red blood clots per the rectum overnight, no further clots or hematochezia thus far today.  -Monitor H/H Q  6 hours x 24 hours -Transfuse for Hg < 8 -Colonoscopy with Dr. Ebony Cargo 1/4/022. Colonoscopy benefits and risks discussed with the patient's son, Allison Gardner, including risk with sedation, risk of bleeding, perforation and infection. I further discussed patient is at a much higher risk for these complications due to her age and multiple co-morbidities.  -Clear liquid diet then NPO after midnight -Abd/pelvic CT angiogram with IV contrast if she develops brisk bleeding overnight  -PPI IV bid  -Further recommendations per Dr. Hilarie Fredrickson  2. History of CVA on Xarelto and Plavix, last doses taken on Wed. 06/11/2019  3. CHF. LV EF 30 -45% per ECHO 01/2020  4. Cirrhosis per CTAP 05/11/2020.   5. Thrombocytopenia. PLT 88. CTAP showed cirrhosis. No splenomegaly.   6. AKI on CKD. Cr 1.36 (Cr 1.56 on 05/11/2021).  Allison Gardner  05/15/2020, 9:16 AM

## 2020-05-15 NOTE — ED Notes (Signed)
Notified nurse Educational psychologist.BLOOD DRAW NO BLOOD RETURN X2

## 2020-05-15 NOTE — ED Notes (Signed)
Hospitalist aware of pts hgb at 7.6 and pt not really being able to tolerate Golytely bowel prep.

## 2020-05-15 NOTE — ED Notes (Signed)
Pt has food/drink at bedside.

## 2020-05-15 NOTE — ED Provider Notes (Signed)
[facility] EMERGENCY DEPARTMENT Provider Note  CSN: [id number] Arrival date & time: 05/14/20 1707  Chief Complaint(s) GI Bleeding  HPI Allison Gardner is a 87 y.o. female with a past medical history listed below including strokes currently on Xarelto who presents to the emergency department for GI bleed.  Bleeding began 1 week ago.  She lives in [place] and went to the [facility] where she reportedly stayed in the emergency department for 4 days.  During that time the daughter reports that she was found to have a hemoglobin of 5 and required 3 transfusions.  Family decided to bring the patient down here because the GI doctor was not available up in [place].  Patient is reporting clots with each bowel movement.  She is endorsing generalized fatigue.  Currently no chest pain or shortness of breath.  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  Remainder of history, ROS, and physical exam limited due to patient's condition (h/o memory loss). Additional information was obtained from daughter.   Level V Caveat.    HPI  Past Medical History Past Medical History:  Diagnosis Date  . CHF (congestive heart failure) ([place])   . Headache(784.0)   . Kidney stones   . Memory loss   . Stroke [facility])    Patient Active Problem List   Diagnosis Date Noted  . Foot drop, left 09/28/2015  . Memory loss 09/01/2013  . Mild cognitive impairment 09/01/2013  . Fracture of pubic ramus ([place]) 04/15/2013  . Late effects of cerebrovascular disease 09/03/2012  . Headache(784.0) 09/03/2012  . Dizziness and giddiness 09/03/2012   Home Medication(s) Prior to Admission medications   Medication Sig Start Date End Date Taking? Authorizing Provider  Acetaminophen (TYLENOL PO) Take by mouth.    [provider]  amLODipine (NORVASC) 5 MG tablet Take 5 mg by mouth daily. 09/22/15   [provider]  aspirin 81 MG  tablet Take 81 mg by mouth daily.    [provider]  butalbital-acetaminophen-caffeine (FIORICET, ESGIC) 50-325-40 MG tablet TAKE 1 TABLET BY MOUTH EVERY 6 HOURS AS NEEDED FOR MIGRAINE 09/22/15   [provider]  carvedilol (COREG) 6.25 MG tablet carvedilol 6.25 mg tablet    [provider]  clopidogrel (PLAVIX) 75 MG tablet Take 75 mg by mouth daily. 09/22/15   [provider]  Omega-3 Fatty Acids (FISH OIL) 1000 MG CAPS Take by mouth.    [provider]  pantoprazole (PROTONIX) 40 MG tablet  09/25/15   [provider]  rosuvastatin (CRESTOR) 5 MG tablet Take 5 mg by mouth at bedtime. 08/31/15   [provider]  traMADol (ULTRAM) 50 MG tablet Take 50 mg by mouth every 6 (six) hours as needed. for pain 09/22/15   [provider]  Past Surgical History Past Surgical History:  Procedure Laterality Date  . ABDOMINAL HYSTERECTOMY    . APPENDECTOMY     Family History No family history on file.  Social History Social History   Tobacco Use  . Smoking status: Never Smoker  . Smokeless tobacco: Never Used  Substance Use Topics  . Alcohol use: No  . Drug use: No   Allergies Ampicillin and Demerol [meperidine]  Review of Systems Review of Systems All other systems are reviewed and are negative for acute change except as noted in the HPI  Physical Exam Vital Signs  I have reviewed the triage vital signs BP (!) 103/53 (BP Location: Right Arm)   Pulse 92   Temp 98.6 F (37 C) (Oral)   Resp 16   SpO2 99%   Physical Exam Vitals reviewed.  Constitutional:      General: She is not in acute distress.    Appearance: She is well-developed and well-nourished. She is not diaphoretic.  HENT:     Head: Normocephalic and atraumatic.     Nose: Nose normal.  Eyes:     General: No scleral icterus.        Right eye: No discharge.        Left eye: No discharge.     Extraocular Movements: EOM normal.     Conjunctiva/sclera: Conjunctivae normal.     Pupils: Pupils are equal, round, and reactive to light.  Cardiovascular:     Rate and Rhythm: Normal rate and regular rhythm.     Heart sounds: No murmur heard. No friction rub. No gallop.   Pulmonary:     Effort: Pulmonary effort is normal. No respiratory distress.     Breath sounds: Normal breath sounds. No stridor. No rales.  Abdominal:     General: There is no distension.     Palpations: Abdomen is soft.     Tenderness: There is no abdominal tenderness.  Genitourinary:    Comments: Gross clotted blood noted Musculoskeletal:        General: No tenderness or edema.     Cervical back: Normal range of motion and neck supple.  Skin:    General: Skin is warm and dry.     Findings: No erythema or rash.  Neurological:     Mental Status: She is alert and oriented to person, place, and time.  Psychiatric:        Mood and Affect: Mood and affect normal.     ED Results and Treatments Labs (all labs ordered are listed, but only abnormal results are displayed) Labs Reviewed  COMPREHENSIVE METABOLIC PANEL - Abnormal; Notable for the following components:      Result Value   Potassium 5.6 (*)    Chloride 115 (*)    CO2 20 (*)    BUN 27 (*)    Creatinine, Ser 1.36 (*)    Calcium 8.7 (*)    Total Protein 5.3 (*)    Albumin 2.8 (*)    Total Bilirubin 1.4 (*)    GFR, Estimated 36 (*)    All other components within normal limits  CBC - Abnormal; Notable for the following components:   RBC 3.30 (*)    Hemoglobin 9.7 (*)    HCT 31.6 (*)    RDW 18.0 (*)    Platelets 88 (*)    All other components within normal limits  RESP PANEL BY RT-PCR (FLU A&B, COVID) ARPGX2  PROTIME-INR  POC OCCULT BLOOD, ED  TYPE AND SCREEN  EKG  EKG Interpretation  Date/Time:    Ventricular Rate:    PR Interval:    QRS Duration:   QT Interval:    QTC Calculation:   R Axis:     Text Interpretation:        Radiology No results found.  Pertinent labs & imaging results that were available during my care of the patient were reviewed by me and considered in my medical decision making (see chart for details).  Medications Ordered in ED Medications  sodium chloride 0.9 % bolus 1,000 mL (has no administration in time range)    And  0.9 %  sodium chloride infusion (has no administration in time range)                                                                                                                                    Procedures Procedures  (including critical care time)  Medical Decision Making / ED Course I have reviewed the nursing notes for this encounter and the patient's prior records (if available in EHR or on provided paperwork).   Allison Gardner was evaluated in Emergency Department on 05/15/2020 for the symptoms described in the history of present illness. She was evaluated in the context of the global COVID-19 pandemic, which necessitated consideration that the patient might be at risk for infection with the SARS-CoV-2 virus that causes COVID-19. Institutional protocols and algorithms that pertain to the evaluation of patients at risk for COVID-19 are in a state of rapid change based on information released by regulatory bodies including the CDC and federal and state organizations. These policies and algorithms were followed during the patient's care in the ED.  Patient presents with lower GI bleed. On anticoagulation which has been held since Monday per the daughter. Current hemoglobin is 9.7.  No prior for comparison.  BUN is elevated.  Also noted to have mild renal insufficiency. Again unable to compare to prior baselines. Attempting to get records from [place].  Patient will  require admission to medicine with GI consultation in the morning.      Final Clinical Impression(s) / ED Diagnoses Final diagnoses:  Lower GI bleed      This chart was dictated using voice recognition software.  Despite best efforts to proofread,  errors can occur which can change the documentation meaning.   Fatima Blank, MD 05/15/20 [phone]

## 2020-05-16 ENCOUNTER — Encounter (HOSPITAL_COMMUNITY): Payer: Self-pay | Admitting: Internal Medicine

## 2020-05-16 DIAGNOSIS — D62 Acute posthemorrhagic anemia: Secondary | ICD-10-CM

## 2020-05-16 DIAGNOSIS — L8990 Pressure ulcer of unspecified site, unspecified stage: Secondary | ICD-10-CM

## 2020-05-16 DIAGNOSIS — K7469 Other cirrhosis of liver: Secondary | ICD-10-CM

## 2020-05-16 HISTORY — DX: Pressure ulcer of unspecified site, unspecified stage: L89.90

## 2020-05-16 LAB — CBC
HCT: 22.4 % — ABNORMAL LOW (ref 36.0–46.0)
Hemoglobin: 6.7 g/dL — CL (ref 12.0–15.0)
MCH: 29.6 pg (ref 26.0–34.0)
MCHC: 29.9 g/dL — ABNORMAL LOW (ref 30.0–36.0)
MCV: 99.1 fL (ref 80.0–100.0)
Platelets: 142 10*3/uL — ABNORMAL LOW (ref 150–400)
RBC: 2.26 MIL/uL — ABNORMAL LOW (ref 3.87–5.11)
RDW: 18.4 % — ABNORMAL HIGH (ref 11.5–15.5)
WBC: 3.3 10*3/uL — ABNORMAL LOW (ref 4.0–10.5)
nRBC: 0 % (ref 0.0–0.2)

## 2020-05-16 LAB — BASIC METABOLIC PANEL
Anion gap: 5 (ref 5–15)
BUN: 24 mg/dL — ABNORMAL HIGH (ref 8–23)
CO2: 20 mmol/L — ABNORMAL LOW (ref 22–32)
Calcium: 7.9 mg/dL — ABNORMAL LOW (ref 8.9–10.3)
Chloride: 116 mmol/L — ABNORMAL HIGH (ref 98–111)
Creatinine, Ser: 1.27 mg/dL — ABNORMAL HIGH (ref 0.44–1.00)
GFR, Estimated: 39 mL/min — ABNORMAL LOW (ref >60)
Glucose, Bld: 73 mg/dL (ref 70–99)
Potassium: 4.6 mmol/L (ref 3.5–5.1)
Sodium: 141 mmol/L (ref 135–145)

## 2020-05-16 LAB — HEPATITIS C ANTIBODY: HCV Ab: NONREACTIVE

## 2020-05-16 LAB — MRSA PCR SCREENING: MRSA by PCR: NEGATIVE

## 2020-05-16 LAB — HEMOGLOBIN AND HEMATOCRIT, BLOOD
HCT: 26.2 % — ABNORMAL LOW (ref 36.0–46.0)
Hemoglobin: 8.5 g/dL — ABNORMAL LOW (ref 12.0–15.0)

## 2020-05-16 LAB — HEPATITIS B CORE ANTIBODY, TOTAL: Hep B Core Total Ab: NONREACTIVE

## 2020-05-16 LAB — HEPATITIS B SURFACE ANTIGEN: Hepatitis B Surface Ag: NONREACTIVE

## 2020-05-16 LAB — PREPARE RBC (CROSSMATCH)

## 2020-05-16 MED ORDER — LEVOTHYROXINE SODIUM 25 MCG PO TABS
25.0000 ug | ORAL_TABLET | Freq: Every day | ORAL | Status: DC
  Administered 2020-05-16 – 2020-05-20 (×5): 25 ug via ORAL
  Filled 2020-05-16 (×5): qty 1

## 2020-05-16 MED ORDER — FUROSEMIDE 20 MG PO TABS: 20.0000 mg | ORAL_TABLET | Freq: Two times a day (BID) | ORAL | Status: DC

## 2020-05-16 MED ORDER — FUROSEMIDE 20 MG PO TABS
20.0000 mg | ORAL_TABLET | Freq: Every day | ORAL | Status: DC
  Administered 2020-05-16 – 2020-05-20 (×5): 20 mg via ORAL
  Filled 2020-05-16 (×5): qty 1

## 2020-05-16 MED ORDER — SODIUM CHLORIDE 0.9% IV SOLUTION: Freq: Once | INTRAVENOUS | Status: AC

## 2020-05-16 NOTE — Progress Notes (Addendum)
Progress Note    Allison Gardner  LOV:[id number] DOB: Nov 14, 1922  DOA: 05/14/2020 PCP: Andres Shad, MD    Brief Narrative:   Medical records reviewed and are as summarized below:  Allison Gardner is an 87 y.o. female with past medical history of intracranial hemorrhage (2012), multiple ischemic strokes in the past, hypertension, peripheral vascular disease, systolic and diastolic congestive heart failure ( Echo 01/2020 EF 30-35%), cirrhosis (felt to be due to CHF), hyperlipidemia, gastroesophageal reflux disease, mild cognitive impairment, migraine headaches who presents to [facility] emergency department due to complaints of frequent dark bloody stools.  Assessment/Plan:   Principal Problem:   Acute blood loss anemia Active Problems:   Peripheral arterial occlusive disease (HCC)   Chronic anticoagulation   Mixed hyperlipidemia   Chronic combined systolic and diastolic congestive heart failure (HCC)   GERD (gastroesophageal reflux disease)   Essential hypertension   Acute GI bleeding   Hypothyroidism   Thrombocytopenia (HCC)   Chronic kidney disease, stage 3b (HCC)   Hyperkalemia   GI bleed   Pressure injury of skin     Acute blood loss anemia  Patient presenting with approximately a day history of intermittent bouts of dark bloody stools  Patient will be kept n.p.o.  Providing patient with intravenous Protonix every 12  Given 3 units at OSH, getting 1 unit PRBC on 1/4- tran.  GI consult appreciated-- plan for colonoscopy when prep complete-- 1/5 most likely    Chronic anticoagulation  Does not appear to be taking plavix any longer  Is on xarelto or elquis for reported CHF  Will call Dr. Orpah Greek, MD to clarify (had to LM on the nurse's voicemail): 10/4- asa d/c'd by cards; per office was on BOTH plavix and xarelto 10 or 15 mg daily (started in September in hospital for multiple cvas but no documented a fibs)-- PENDING [place] who is her neurologist    Mixed hyperlipidemia  statin    Chronic combined systolic and diastolic congestive heart failure ([place])  Advanced congestive heart failure with last ejection fraction of 30 to 88% with complications of congestive cirrhosis  Monitor volume status closely- holding entresto (per office could not afford entresto) and resume lasix post PRBCs    Thrombocytopenia ([place])  Patient exhibiting mild thrombocytopenia, presumably from recent diagnosis of cirrhosis felt to be secondary to patient's advanced congestive heart failure  stable    Chronic kidney disease, stage 3b ([place])  Review of outside records reveals that baseline creatinine is likely 1.5    GERD (gastroesophageal reflux disease)  Patient already on IV PPI  Hyperkalemia  resolved   PT eva after colonoscopy- per prior records, ambulates with cane  Family Communication/Anticipated D/C date and plan/Code Status   DVT prophylaxis: scd Code Status: Full Code.  Family Communication: daughter at bedside Disposition Plan: Status is: Inpatient  Remains inpatient appropriate because:Ongoing diagnostic testing needed not appropriate for outpatient work up   Dispo: The patient is from: Home              Anticipated d/c is to: Home              Anticipated d/c date is: 3 days              Patient currently is not medically stable to d/c.         Medical Consultants:    GI  Subjective:   Having trouble  drinking the prep  Objective:    Vitals:   05/16/20 0820 05/16/20 0900 05/16/20 0955 05/16/20 1100  BP: (!) 111/57 (!) 103/57 99/74 (!) 103/59  Pulse: 84 77 88 92  Resp: 20 17 (!) 23 (!) 22  Temp: 97.6 F (36.4 C)  (!) 97.4 F (36.3 C) 98.2 F (36.8 C)  TempSrc:   Oral Oral  SpO2: 100% 100% 95% 99%  Weight:      Height:        Intake/Output Summary (Last 24 hours) at 05/16/2020 1151 Last data filed at 05/16/2020 1000 Gross per 24 hour   Intake 479.67 ml  Output --  Net 479.67 ml   Filed Weights   05/15/20 0615 05/16/20 0429  Weight: 43.2 kg 49 kg    Exam:  General: Appearance:    elderly female in no acute distress     Lungs:     respirations unlabored  Heart:    Normal heart rate. Normal rhythm. No murmurs, rubs, or gallops.   MS:   All extremities are intact.   Neurologic:   Awake, alert, pleasant and cooperative     Data Reviewed:   I have personally reviewed following labs and imaging studies:  Labs: Labs show the following:   Basic Metabolic Panel: Recent Labs  Lab 05/14/20 2107 05/15/20 0528 05/16/20 0347  NA 144 141 141  K 5.6* 4.8 4.6  CL 115* 115* 116*  CO2 20* 20* 20*  GLUCOSE 71 75 73  BUN 27* 27* 24*  CREATININE 1.36* 1.36* 1.27*  CALCIUM 8.7* 8.2* 7.9*   GFR Estimated Creatinine Clearance: 19 mL/min (A) (by C-G formula based on SCr of 1.27 mg/dL (H)). Liver Function Tests: Recent Labs  Lab 05/14/20 2107 05/15/20 0528  AST 29 20  ALT 10 9  ALKPHOS 116 108  BILITOT 1.4* 1.2  PROT 5.3* 4.6*  ALBUMIN 2.8* 2.5*   No results for input(s): LIPASE, AMYLASE in the last 168 hours. No results for input(s): AMMONIA in the last 168 hours. Coagulation profile Recent Labs  Lab 05/15/20 0528  INR 1.3*    CBC: Recent Labs  Lab 05/14/20 2107 05/15/20 0528 05/15/20 1528 05/15/20 2130 05/16/20 0347  WBC 4.6 4.2 3.8* 3.5* 3.3*  HGB 9.7* 7.9* 7.9* 7.6* 6.7*  HCT 31.6* 24.3* 25.9* 25.8* 22.4*  MCV 95.8 94.6 97.7 102.0* 99.1  PLT 88* 137* 150 141* 142*   Cardiac Enzymes: No results for input(s): CKTOTAL, CKMB, CKMBINDEX, TROPONINI in the last 168 hours. BNP (last 3 results) No results for input(s): PROBNP in the last 8760 hours. CBG: No results for input(s): GLUCAP in the last 168 hours. D-Dimer: No results for input(s): DDIMER in the last 72 hours. Hgb A1c: No results for input(s): HGBA1C in the last 72 hours. Lipid Profile: No results for input(s): CHOL, HDL, LDLCALC,  TRIG, CHOLHDL, LDLDIRECT in the last 72 hours. Thyroid function studies: Recent Labs    05/15/20 0528  TSH 0.614   Anemia work up: No results for input(s): VITAMINB12, FOLATE, FERRITIN, TIBC, IRON, RETICCTPCT in the last 72 hours. Sepsis Labs: Recent Labs  Lab 05/15/20 0528 05/15/20 1528 05/15/20 2130 05/16/20 0347  WBC 4.2 3.8* 3.5* 3.3*    Microbiology Recent Results (from the past 240 hour(s))  Resp Panel by RT-PCR (Flu A&B, Covid) Nasopharyngeal Swab     Status: None   Collection Time: 05/15/20 12:38 AM   Specimen: Nasopharyngeal Swab; Nasopharyngeal(NP) swabs in vial transport medium  Result Value Ref Range Status  SARS Coronavirus 2 by RT PCR NEGATIVE NEGATIVE Final    Comment: (NOTE) SARS-CoV-2 target nucleic acids are NOT DETECTED.  The SARS-CoV-2 RNA is generally detectable in upper respiratory specimens during the acute phase of infection. The lowest concentration of SARS-CoV-2 viral copies this assay can detect is 138 copies/mL. A negative result does not preclude SARS-Cov-2 infection and should not be used as the sole basis for treatment or other patient management decisions. A negative result may occur with  improper specimen collection/handling, submission of specimen other than nasopharyngeal swab, presence of viral mutation(s) within the areas targeted by this assay, and inadequate number of viral copies(<138 copies/mL). A negative result must be combined with clinical observations, patient history, and epidemiological information. The expected result is Negative.  Fact Sheet for Patients:  EntrepreneurPulse.com.au  Fact Sheet for Healthcare Providers:  IncredibleEmployment.be  This test is no t yet approved or cleared by the [place] FDA and  has been authorized for detection and/or diagnosis of SARS-CoV-2 by FDA under an Emergency Use Authorization (EUA). This EUA will remain  in effect (meaning this test  can be used) for the duration of the COVID-19 declaration under Section 564(b)(1) of the Act, 21 U.S.C.section 360bbb-3(b)(1), unless the authorization is terminated  or revoked sooner.       Influenza A by PCR NEGATIVE NEGATIVE Final   Influenza B by PCR NEGATIVE NEGATIVE Final    Comment: (NOTE) The Xpert Xpress SARS-CoV-2/FLU/RSV plus assay is intended as an aid in the diagnosis of influenza from Nasopharyngeal swab specimens and should not be used as a sole basis for treatment. Nasal washings and aspirates are unacceptable for Xpert Xpress SARS-CoV-2/FLU/RSV testing.  Fact Sheet for Patients: EntrepreneurPulse.com.au  Fact Sheet for Healthcare Providers: IncredibleEmployment.be  This test is not yet approved or cleared by the [place] FDA and has been authorized for detection and/or diagnosis of SARS-CoV-2 by FDA under an Emergency Use Authorization (EUA). This EUA will remain in effect (meaning this test can be used) for the duration of the COVID-19 declaration under Section 564(b)(1) of the Act, 21 U.S.C. section 360bbb-3(b)(1), unless the authorization is terminated or revoked.  Performed at [facility], [place] [address] [place], [place] [address]   MRSA PCR Screening     Status: None   Collection Time: 05/16/20  4:29 AM   Specimen: Nasopharyngeal  Result Value Ref Range Status   MRSA by PCR NEGATIVE NEGATIVE Final    Comment:        The GeneXpert MRSA Assay (FDA approved for NASAL specimens only), is one component of a comprehensive MRSA colonization surveillance program. It is not intended to diagnose MRSA infection nor to guide or monitor treatment for MRSA infections. Performed at [facility], [place] [address], [place], [place] [address]     Procedures and diagnostic studies:  No results found.  Medications:   . pantoprazole (PROTONIX) IV  40 mg Intravenous Q12H  . polyethylene glycol  4,000 mL  Oral Once  . rosuvastatin  5 mg Oral QHS   Continuous Infusions:   LOS: 1 day   Geradine Girt  [facility]   How to contact the [facility] Attending or Consulting provider [place] or covering provider during after hours [place], for this patient?  1. Check the care team in [facility] and look for a) attending/consulting TR[facility] provider listed and b) the [facility] team listed 2. Log into [email or web address] and use [place]'s universal password to access. If  you do not have the password, please contact the hospital operator. 3. Locate the [facility] provider you are looking for under [facility] and page to a number that you can be directly reached. 4. If you still have difficulty reaching the provider, please page the [facility] (Director on Call) for the Hospitalists listed on amion for assistance.  05/16/2020, 11:51 AM

## 2020-05-16 NOTE — Anesthesia Preprocedure Evaluation (Addendum)
Anesthesia Evaluation  Patient identified by MRN, date of birth, ID band Patient awake    Reviewed: Allergy & Precautions, NPO status , Patient's Chart, lab work & pertinent test results  History of Anesthesia Complications Negative for: history of anesthetic complications  Airway Mallampati: II  TM Distance: >3 FB Neck ROM: Full    Dental  (+) Edentulous Upper   Pulmonary neg pulmonary ROS,    Pulmonary exam normal        Cardiovascular hypertension, Pt. on medications and Pt. on home beta blockers + Peripheral Vascular Disease and +CHF  Normal cardiovascular exam   EF 30-35% per report, no echo results in Epic to confirm    Neuro/Psych  Headaches, CVA, No Residual Symptoms negative psych ROS   GI/Hepatic Neg liver ROS, GERD  Controlled,  Endo/Other  Hypothyroidism  On xarelto   Renal/GU CRFRenal disease     Musculoskeletal negative musculoskeletal ROS (+)   Abdominal   Peds  Hematology  (+) anemia ,  Plt 142k    Anesthesia Other Findings Covid test negative   Reproductive/Obstetrics                            Anesthesia Physical Anesthesia Plan  ASA: III  Anesthesia Plan: MAC   Post-op Pain Management:    Induction: Intravenous  PONV Risk Score and Plan: 2 and Propofol infusion and Treatment may vary due to age or medical condition  Airway Management Planned: Nasal Cannula and Natural Airway  Additional Equipment: None  Intra-op Plan:   Post-operative Plan:   Informed Consent: I have reviewed the patients History and Physical, chart, labs and discussed the procedure including the risks, benefits and alternatives for the proposed anesthesia with the patient or authorized representative who has indicated his/her understanding and acceptance.       Plan Discussed with: CRNA and Anesthesiologist  Anesthesia Plan Comments:        Anesthesia Quick  Evaluation

## 2020-05-16 NOTE — H&P (View-Only) (Signed)
Progress Note   Subjective  Patient resting comfortably in bed Denies complaints Daughter, [name], at bedside Able to drink maybe one fourth of GoLYTELY prep and did have loose bloody bowel movement x2  Daughter asked about cirrhosis by imaging Daughter clarifies that she has been off Plavix for several years but started Xarelto in the summer 2021 for "congestive heart failure".  She also reports she takes baby aspirin  1 unit of blood hanging and infusing   Objective  Vital signs in last 24 hours: Temp:  [97.2 F (36.2 C)-98.5 F (36.9 C)] 97.6 F (36.4 C) (01/04 0820) Pulse Rate:  [66-89] 84 (01/04 0820) Resp:  [16-33] 20 (01/04 0820) BP: (84-139)/(40-62) 111/57 (01/04 0820) SpO2:  [95 %-100 %] 100 % (01/04 0820) Weight:  [49 kg] 49 kg (01/04 0429) Last BM Date: 05/15/20  General: Alert, well-developed, in NAD Heart:  Regular rate and rhythm Chest: Clear to ascultation bilaterally Abdomen:  Soft, mildly tender in the lower abdomen both right and left without rebound or guarding, nondistended. Normal bowel sounds Extremities:  Without edema. Neurologic:  Alert and  oriented x3; grossly normal neurologically. Psych:  Alert and cooperative.   Intake/Output from previous day: 01/03 0701 - 01/04 0700 In: 125 [I.V.:125] Out: -  Intake/Output this shift: No intake/output data recorded.  Lab Results: Recent Labs    05/15/20 1528 05/15/20 2130 05/16/20 0347  WBC 3.8* 3.5* 3.3*  HGB 7.9* 7.6* 6.7*  HCT 25.9* 25.8* 22.4*  PLT 150 141* 142*   BMET Recent Labs    05/14/20 2107 05/15/20 0528 05/16/20 0347  NA 144 141 141  K 5.6* 4.8 4.6  CL 115* 115* 116*  CO2 20* 20* 20*  GLUCOSE 71 75 73  BUN 27* 27* 24*  CREATININE 1.36* 1.36* 1.27*  CALCIUM 8.7* 8.2* 7.9*   LFT Recent Labs    05/15/20 0528  PROT 4.6*  ALBUMIN 2.5*  AST 20  ALT 9  ALKPHOS 108  BILITOT 1.2   PT/INR Recent Labs    05/15/20 0528  LABPROT 15.2  INR 1.3*      Assessment  & Recommendations  87 year old female with a history of hypertension, CHF with EF around 35%, prior intracranial hemorrhage in 2012, history of ischemic stroke recently on Xarelto, presenting with ongoing intermittent hematochezia and acute on chronic anemia.  1.  Hematochezia/presumed lower GI bleed/acute on chronic anemia--plan had been for colonoscopy today however she was not able to drink much of her bowel preparation.  CT scan in [place] of the abdomen pelvis suggested possible source of bleeding near the cecum.  Given that a full colonoscopy is needed, I do not think she is ready for procedure given that the prep has not been mostly completed.  We discussed this today both with the patient and her daughter.  Patient agreeable to try to drink as much of the remaining prep through the course of the day as possible for repeat colonoscopy tomorrow.   --Plan to repeat colonoscopy tomorrow with monitored anesthesia care --1 unit of packed cells ordered, follow-up posttransfusion hemoglobin, transfuse to keep hemoglobin greater than 7.0 --If brisk bleeding consider repeat CT abdomen pelvis angiography with IR consultation, particularly if she cannot complete bowel preparation --Hold Xarelto, question whether this ever needs to be resumed --Clear liquids until midnight in addition to the prep for colonoscopy  2.  Cirrhosis by imaging --by CT scan as seen from [facility].  There is no overt evidence of portal hypertension.  We discussed this today, possibly could be cardiac in nature.  I will check viral hepatitis serologies.  Platelets have been normal though are slightly low today.  INR is 1.3.  Liver enzymes including alkaline phosphatase and bilirubin are normal.  If she tolerates the prep I may consider performing upper endoscopy in addition to colonoscopy to rule out portal hypertensive changes in the upper GI tract given recent bleeding. --Check viral hepatitis serologies --Upper  endoscopy tomorrow assuming she is fit for colonoscopy     LOS: 1 day   Jerene Bears  05/16/2020, 9:46 AM

## 2020-05-16 NOTE — Plan of Care (Signed)
  Problem: Coping: Goal: Level of anxiety will decrease Outcome: Progressing   Problem: Pain Managment: Goal: General experience of comfort will improve Outcome: Progressing   Problem: Elimination: Goal: Will not experience complications related to bowel motility Outcome: Progressing Goal: Will not experience complications related to urinary retention Outcome: Progressing   Problem: Safety: Goal: Ability to remain free from injury will improve Outcome: Progressing   Problem: Skin Integrity: Goal: Risk for impaired skin integrity will decrease Outcome: Progressing

## 2020-05-16 NOTE — Progress Notes (Signed)
Cross-coverage note:   Patient seen on f/u rounds while holding in ED for progressive bed. Patient having difficulty completing bowel prep, following q6h CBCs. Discussed with RN.

## 2020-05-16 NOTE — Plan of Care (Signed)

## 2020-05-16 NOTE — Progress Notes (Signed)
Progress Note   Subjective  Patient resting comfortably in bed Denies complaints Daughter, [name], at bedside Able to drink maybe one fourth of GoLYTELY prep and did have loose bloody bowel movement x2  Daughter asked about cirrhosis by imaging Daughter clarifies that she has been off Plavix for several years but started Xarelto in the summer 2021 for "congestive heart failure".  She also reports she takes baby aspirin  1 unit of blood hanging and infusing   Objective  Vital signs in last 24 hours: Temp:  [97.2 F (36.2 C)-98.5 F (36.9 C)] 97.6 F (36.4 C) (01/04 0820) Pulse Rate:  [66-89] 84 (01/04 0820) Resp:  [16-33] 20 (01/04 0820) BP: (84-139)/(40-62) 111/57 (01/04 0820) SpO2:  [95 %-100 %] 100 % (01/04 0820) Weight:  [49 kg] 49 kg (01/04 0429) Last BM Date: 05/15/20  General: Alert, well-developed, in NAD Heart:  Regular rate and rhythm Chest: Clear to ascultation bilaterally Abdomen:  Soft, mildly tender in the lower abdomen both right and left without rebound or guarding, nondistended. Normal bowel sounds Extremities:  Without edema. Neurologic:  Alert and  oriented x3; grossly normal neurologically. Psych:  Alert and cooperative.   Intake/Output from previous day: 01/03 0701 - 01/04 0700 In: 125 [I.V.:125] Out: -  Intake/Output this shift: No intake/output data recorded.  Lab Results: Recent Labs    05/15/20 1528 05/15/20 2130 05/16/20 0347  WBC 3.8* 3.5* 3.3*  HGB 7.9* 7.6* 6.7*  HCT 25.9* 25.8* 22.4*  PLT 150 141* 142*   BMET Recent Labs    05/14/20 2107 05/15/20 0528 05/16/20 0347  NA 144 141 141  K 5.6* 4.8 4.6  CL 115* 115* 116*  CO2 20* 20* 20*  GLUCOSE 71 75 73  BUN 27* 27* 24*  CREATININE 1.36* 1.36* 1.27*  CALCIUM 8.7* 8.2* 7.9*   LFT Recent Labs    05/15/20 0528  PROT 4.6*  ALBUMIN 2.5*  AST 20  ALT 9  ALKPHOS 108  BILITOT 1.2   PT/INR Recent Labs    05/15/20 0528  LABPROT 15.2  INR 1.3*      Assessment  & Recommendations  87 year old female with a history of hypertension, CHF with EF around 35%, prior intracranial hemorrhage in 2012, history of ischemic stroke recently on Xarelto, presenting with ongoing intermittent hematochezia and acute on chronic anemia.  1.  Hematochezia/presumed lower GI bleed/acute on chronic anemia--plan had been for colonoscopy today however she was not able to drink much of her bowel preparation.  CT scan in [place] of the abdomen pelvis suggested possible source of bleeding near the cecum.  Given that a full colonoscopy is needed, I do not think she is ready for procedure given that the prep has not been mostly completed.  We discussed this today both with the patient and her daughter.  Patient agreeable to try to drink as much of the remaining prep through the course of the day as possible for repeat colonoscopy tomorrow.   --Plan to repeat colonoscopy tomorrow with monitored anesthesia care --1 unit of packed cells ordered, follow-up posttransfusion hemoglobin, transfuse to keep hemoglobin greater than 7.0 --If brisk bleeding consider repeat CT abdomen pelvis angiography with IR consultation, particularly if she cannot complete bowel preparation --Hold Xarelto, question whether this ever needs to be resumed --Clear liquids until midnight in addition to the prep for colonoscopy  2.  Cirrhosis by imaging --by CT scan as seen from [facility].  There is no overt evidence of portal hypertension.  We discussed this today, possibly could be cardiac in nature.  I will check viral hepatitis serologies.  Platelets have been normal though are slightly low today.  INR is 1.3.  Liver enzymes including alkaline phosphatase and bilirubin are normal.  If she tolerates the prep I may consider performing upper endoscopy in addition to colonoscopy to rule out portal hypertensive changes in the upper GI tract given recent bleeding. --Check viral hepatitis serologies --Upper  endoscopy tomorrow assuming she is fit for colonoscopy     LOS: 1 day   Jerene Bears  05/16/2020, 9:46 AM

## 2020-05-17 ENCOUNTER — Encounter (HOSPITAL_COMMUNITY): Payer: Self-pay | Admitting: Internal Medicine

## 2020-05-17 ENCOUNTER — Encounter (HOSPITAL_COMMUNITY)
Admission: EM | Disposition: A | Discharge status: Home Health | Payer: Self-pay | Source: Home / Self Care | Attending: Internal Medicine

## 2020-05-17 ENCOUNTER — Inpatient Hospital Stay (HOSPITAL_COMMUNITY): Payer: Medicare HMO | Admitting: Certified Registered Nurse Anesthetist

## 2020-05-17 DIAGNOSIS — Z7901 Long term (current) use of anticoagulants: Secondary | ICD-10-CM | POA: Diagnosis not present

## 2020-05-17 DIAGNOSIS — K5521 Angiodysplasia of colon with hemorrhage: Secondary | ICD-10-CM | POA: Diagnosis not present

## 2020-05-17 DIAGNOSIS — K922 Gastrointestinal hemorrhage, unspecified: Secondary | ICD-10-CM

## 2020-05-17 DIAGNOSIS — K761 Chronic passive congestion of liver: Secondary | ICD-10-CM

## 2020-05-17 DIAGNOSIS — D62 Acute posthemorrhagic anemia: Secondary | ICD-10-CM | POA: Diagnosis not present

## 2020-05-17 DIAGNOSIS — K314 Gastric diverticulum: Secondary | ICD-10-CM

## 2020-05-17 DIAGNOSIS — K7460 Unspecified cirrhosis of liver: Secondary | ICD-10-CM

## 2020-05-17 HISTORY — PX: HOT HEMOSTASIS: SHX5433

## 2020-05-17 HISTORY — PX: ESOPHAGOGASTRODUODENOSCOPY (EGD) WITH PROPOFOL: SHX5813

## 2020-05-17 HISTORY — PX: COLONOSCOPY WITH PROPOFOL: SHX5780

## 2020-05-17 HISTORY — PX: HEMOSTASIS CLIP PLACEMENT: SHX6857

## 2020-05-17 LAB — CBC
HCT: 32.4 % — ABNORMAL LOW (ref 36.0–46.0)
Hemoglobin: 10.1 g/dL — ABNORMAL LOW (ref 12.0–15.0)
MCH: 28.9 pg (ref 26.0–34.0)
MCHC: 31.2 g/dL (ref 30.0–36.0)
MCV: 92.8 fL (ref 80.0–100.0)
Platelets: 180 10*3/uL (ref 150–400)
RBC: 3.49 MIL/uL — ABNORMAL LOW (ref 3.87–5.11)
RDW: 20.3 % — ABNORMAL HIGH (ref 11.5–15.5)
WBC: 4.9 10*3/uL (ref 4.0–10.5)
nRBC: 0 % (ref 0.0–0.2)

## 2020-05-17 LAB — HEPATITIS B SURFACE ANTIBODY, QUANTITATIVE: Hep B S AB Quant (Post): 32.5 m[IU]/mL (ref >9.9)

## 2020-05-17 LAB — PROTIME-INR
INR: 1.3 — ABNORMAL HIGH (ref 0.8–1.2)
Prothrombin Time: 15.6 seconds — ABNORMAL HIGH (ref 11.4–15.2)

## 2020-05-17 LAB — BPAM RBC
Blood Product Expiration Date: 202202102359
ISSUE DATE / TIME: 202201040754
Unit Type and Rh: 5100

## 2020-05-17 LAB — TYPE AND SCREEN
ABO/RH(D): O POS
Antibody Screen: NEGATIVE
Unit division: 0

## 2020-05-17 LAB — COMPREHENSIVE METABOLIC PANEL
ALT: 8 U/L (ref 0–44)
AST: 24 U/L (ref 15–41)
Albumin: 2.6 g/dL — ABNORMAL LOW (ref 3.5–5.0)
Alkaline Phosphatase: 108 U/L (ref 38–126)
Anion gap: 8 (ref 5–15)
BUN: 21 mg/dL (ref 8–23)
CO2: 19 mmol/L — ABNORMAL LOW (ref 22–32)
Calcium: 8.2 mg/dL — ABNORMAL LOW (ref 8.9–10.3)
Chloride: 111 mmol/L (ref 98–111)
Creatinine, Ser: 1.39 mg/dL — ABNORMAL HIGH (ref 0.44–1.00)
GFR, Estimated: 35 mL/min — ABNORMAL LOW (ref >60)
Glucose, Bld: 68 mg/dL — ABNORMAL LOW (ref 70–99)
Potassium: 4.7 mmol/L (ref 3.5–5.1)
Sodium: 138 mmol/L (ref 135–145)
Total Bilirubin: 1.1 mg/dL (ref 0.3–1.2)
Total Protein: 4.9 g/dL — ABNORMAL LOW (ref 6.5–8.1)

## 2020-05-17 LAB — MAGNESIUM: Magnesium: 1.8 mg/dL (ref 1.7–2.4)

## 2020-05-17 SURGERY — ESOPHAGOGASTRODUODENOSCOPY (EGD) WITH PROPOFOL
Anesthesia: Monitor Anesthesia Care

## 2020-05-17 SURGERY — COLONOSCOPY WITH PROPOFOL
Anesthesia: Monitor Anesthesia Care

## 2020-05-17 MED ORDER — ESMOLOL HCL 100 MG/10ML IV SOLN
INTRAVENOUS | Status: DC | PRN
  Administered 2020-05-17: 20 mg via INTRAVENOUS

## 2020-05-17 MED ORDER — SODIUM CHLORIDE 0.9 % IV SOLN: INTRAVENOUS | Status: DC

## 2020-05-17 MED ORDER — MAGNESIUM SULFATE 2 GM/50ML IV SOLN
2.0000 g | Freq: Once | INTRAVENOUS | Status: AC
  Administered 2020-05-17: 2 g via INTRAVENOUS
  Filled 2020-05-17: qty 50

## 2020-05-17 MED ORDER — LACTATED RINGERS IV SOLN: INTRAVENOUS | Status: DC | PRN

## 2020-05-17 MED ORDER — PANTOPRAZOLE SODIUM 40 MG PO TBEC
40.0000 mg | DELAYED_RELEASE_TABLET | Freq: Every day | ORAL | Status: DC
  Administered 2020-05-18 – 2020-05-20 (×3): 40 mg via ORAL
  Filled 2020-05-17 (×3): qty 1

## 2020-05-17 MED ORDER — PHENYLEPHRINE 40 MCG/ML (10ML) SYRINGE FOR IV PUSH (FOR BLOOD PRESSURE SUPPORT)
PREFILLED_SYRINGE | INTRAVENOUS | Status: DC | PRN
  Administered 2020-05-17 (×3): 120 ug via INTRAVENOUS

## 2020-05-17 MED ORDER — PROPOFOL 500 MG/50ML IV EMUL
INTRAVENOUS | Status: DC | PRN
  Administered 2020-05-17: 150 ug/kg/min via INTRAVENOUS

## 2020-05-17 MED ORDER — PROPOFOL 10 MG/ML IV BOLUS
INTRAVENOUS | Status: DC | PRN
  Administered 2020-05-17: 20 mg via INTRAVENOUS
  Administered 2020-05-17: 25 mg via INTRAVENOUS

## 2020-05-17 MED ORDER — LIDOCAINE 2% (20 MG/ML) 5 ML SYRINGE
INTRAMUSCULAR | Status: DC | PRN
  Administered 2020-05-17: 50 mg via INTRAVENOUS

## 2020-05-17 SURGICAL SUPPLY — 25 items

## 2020-05-17 NOTE — Op Note (Signed)
[facility] Patient Name: Allison Gardner Procedure Date : 05/17/2020 MRN: [id number] Attending MD: Jerene Bears , MD Date of Birth: 1922-09-10 CSN: [id number] Age: 88 Admit Type: Inpatient Procedure:                Colonoscopy Indications:              Hematochezia, Acute post hemorrhagic anemia Providers:                Lajuan Lines. Hilarie Fredrickson, MD, Doristine Johns, RN, Tyrone Apple, Technician Referring MD:             [facility] Medicines:                Monitored Anesthesia Care Complications:            No immediate complications. Estimated Blood Loss:     Estimated blood loss: none. Procedure:                Pre-Anesthesia Assessment:                           - Prior to the procedure, a History and Physical                            was performed, and patient medications and                            allergies were reviewed. The patient's tolerance of                            previous anesthesia was also reviewed. The risks                            and benefits of the procedure and the sedation                            options and risks were discussed with the patient.                            All questions were answered, and informed consent                            was obtained. Prior Anticoagulants: The patient has                            taken Xarelto (rivaroxaban), last dose was 6 days                            prior to procedure. ASA Grade Assessment: III - A                            patient with severe systemic disease. After  reviewing the risks and benefits, the patient was                            deemed in satisfactory condition to undergo the                            procedure.                           After obtaining informed consent, the colonoscope                            was passed under direct vision. Throughout the                            procedure, the  patient's blood pressure, pulse, and                            oxygen saturations were monitored continuously. The                            PCF-H190DL ([id number] Olympus pediatric colonoscope                            was introduced through the anus and advanced to the                            terminal ileum. The colonoscopy was performed                            without difficulty. The patient tolerated the                            procedure well. The quality of the bowel                            preparation was adequate. The terminal ileum,                            ileocecal valve, appendiceal orifice, and rectum                            were photographed. Scope In: 8:59:44 AM Scope Out: 9:28:45 AM Scope Withdrawal Time: 0 hours 19 minutes 29 seconds  Total Procedure Duration: 0 hours 29 minutes 1 second  Findings:      The digital rectal exam was normal.      The terminal ileum appeared normal. No blood was seen in the terminal       ileum.      Red blood and clotted blood was found in the entire colon. Copious       irrigation and lavage was performed.      A single small angioectasia with active bleeding was found in the cecum.       This was located on the proximal side of the IC valve in the cecum.  Fulguration to stop the bleeding by argon plasma at 0.5 liters/minute       and 20 watts was successful with cessation of bleeding. For additional       hemostatic measure, one hemostatic clip was successfully placed (after       APC ablation). There was no bleeding at the end of the maneuvers.      A few medium-mouthed diverticula were found in the sigmoid colon,       hepatic flexure and ascending colon. No evidence of bleeding from the       diverticula.      The exam was otherwise without abnormality on direct and retroflexion       views. Impression:               - The examined portion of the ileum was normal.                           - Blood in the entire  examined colon.                           - A single actively bleeding colonic angioectasia.                            Treated with argon plasma coagulation (APC) and                            then 1 clip was placed.                           - Diverticulosis in the sigmoid colon, at the                            hepatic flexure and in the ascending colon.                           - The examination was otherwise normal on direct                            and retroflexion views.                           - No specimens collected. Moderate Sedation:      N/A Recommendation:           - Return patient to hospital ward for ongoing care.                           - Resume regular diet.                           - Continue present medications. Avoid NSAIDs.                           - Would hold Xarelto at least 2 additional days.                            Discussion concerning the long-term risk/benefit of  Xarelto with prescribing provider is appropriate.                           - If Hgb stable I anticipate she could go home                            tomorrow.                           - GI will remain available, please call if                            questions. Procedure Code(s):        --- Professional ---                           [phone], Colonoscopy, flexible; with control of                            bleeding, any method Diagnosis Code(s):        --- Professional ---                           K92.2, Gastrointestinal hemorrhage, unspecified                           K55.21, Angiodysplasia of colon with hemorrhage                           K92.1, Melena (includes Hematochezia)                           D62, Acute posthemorrhagic anemia                           K57.30, Diverticulosis of large intestine without                            perforation or abscess without bleeding CPT copyright 2019 [employer]. All rights reserved. The  codes documented in this report are preliminary and upon coder review may  be revised to meet current compliance requirements. Jerene Bears, MD 05/17/2020 9:52:35 AM This report has been signed electronically. Number of Addenda: 0

## 2020-05-17 NOTE — Op Note (Signed)
[facility] Patient Name: Allison Gardner Procedure Date : 05/17/2020 MRN: [id number] Attending MD: Jerene Bears , MD Date of Birth: 1922-08-28 CSN: [id number] Age: 87 Admit Type: Inpatient Procedure:                Upper GI endoscopy Indications:              Active gastrointestinal bleeding, Cirrhosis (seen                            by imaging) rule out esophageal varices Providers:                Lajuan Lines. Hilarie Fredrickson, MD, Doristine Johns, RN, Tyrone Apple, Technician Referring MD:             [facility] Medicines:                Monitored Anesthesia Care Complications:            No immediate complications. Estimated Blood Loss:     Estimated blood loss: none. Procedure:                Pre-Anesthesia Assessment:                           - Prior to the procedure, a History and Physical                            was performed, and patient medications and                            allergies were reviewed. The patient's tolerance of                            previous anesthesia was also reviewed. The risks                            and benefits of the procedure and the sedation                            options and risks were discussed with the patient.                            All questions were answered, and informed consent                            was obtained. Prior Anticoagulants: The patient has                            taken Xarelto (rivaroxaban), last dose was 6 days                            prior to procedure. ASA Grade Assessment: III - A  patient with severe systemic disease. After                            reviewing the risks and benefits, the patient was                            deemed in satisfactory condition to undergo the                            procedure.                           After obtaining informed consent, the endoscope was                            passed  under direct vision. Throughout the                            procedure, the patient's blood pressure, pulse, and                            oxygen saturations were monitored continuously. The                            GIF-H190 ([id number]) Olympus gastroscope was                            introduced through the mouth, and advanced to the                            second part of duodenum. The upper GI endoscopy was                            accomplished without difficulty. The patient                            tolerated the procedure well. Scope In: Scope Out: Findings:      A non-obstructing Schatzki ring was found at the gastroesophageal       junction.      The exam was otherwise without abnormality. No evidence of esophageal       varices.      A small non-bleeding diverticulum was found in the gastric fundus.      The exam of the stomach was otherwise normal. No evidence of gastric       varices or portal gastropathy.      The examined duodenum was normal. Impression:               - Non-obstructing Schatzki ring. Otherwise normal                            esophagus.                           - Gastric diverticulum.                           -  Normal examined duodenum.                           - No evidence of portal hypertensive complication                            (varices or gastropathy) in the UGI tract.                           - No specimens collected. Moderate Sedation:      N/A Recommendation:           - Return patient to hospital ward for ongoing care.                           - Continue present medications.                           - See the other procedure note for documentation of                            additional recommendations. Procedure Code(s):        --- Professional ---                           [phone], Esophagogastroduodenoscopy, flexible,                            transoral; diagnostic, including collection of                             specimen(s) by brushing or washing, when performed                            (separate procedure) Diagnosis Code(s):        --- Professional ---                           K22.2, Esophageal obstruction                           K31.4, Gastric diverticulum                           K92.2, Gastrointestinal hemorrhage, unspecified                           K74.60, Unspecified cirrhosis of liver CPT copyright 2019 [employer]. All rights reserved. The codes documented in this report are preliminary and upon coder review may  be revised to meet current compliance requirements. Jerene Bears, MD 05/17/2020 9:44:01 AM This report has been signed electronically. Number of Addenda: 0

## 2020-05-17 NOTE — Transfer of Care (Signed)
Immediate Anesthesia Transfer of Care Note  Patient: Allison Gardner  Procedure(s) Performed: COLONOSCOPY WITH PROPOFOL (N/A ) ESOPHAGOGASTRODUODENOSCOPY (EGD) WITH PROPOFOL (N/A ) HOT HEMOSTASIS (ARGON PLASMA COAGULATION/BICAP) (N/A ) HEMOSTASIS CLIP PLACEMENT  Patient Location: PACU  Anesthesia Type:MAC  Level of Consciousness: drowsy  Airway & Oxygen Therapy: Patient Spontanous Breathing and Patient connected to nasal cannula oxygen  Post-op Assessment: Report given to RN and Post -op Vital signs reviewed and stable  Post vital signs: Reviewed and stable  Last Vitals:  Vitals Value Taken Time  BP 93/72 05/17/20 0940  Temp 36.6 C 05/17/20 0937  Pulse 79 05/17/20 0940  Resp 23 05/17/20 0944  SpO2 84 % 05/17/20 0940  Vitals shown include unvalidated device data.  Last Pain:  Vitals:   05/17/20 0940  TempSrc:   PainSc: 0-No pain         Complications: No complications documented.

## 2020-05-17 NOTE — Progress Notes (Signed)
PROGRESS NOTE    Allison Gardner  OHY:[id number] DOB: 1922/12/15 DOA: 05/14/2020 PCP: Andres Shad, MD   Brief Narrative:  HPI on 05/15/2020 by Dr. Inda Merlin 87 year old female with past medical history of intracranial hemorrhage (2012), multiple ischemic strokes in the past, hypertension, peripheral vascular disease, systolic and diastolic congestive heart failure ( Echo 01/2020 EF 30-35%), cirrhosis (felt to be due to CHF), hyperlipidemia, gastroesophageal reflux disease, mild cognitive impairment, migraine headaches who presents to [facility] emergency department due to complaints of frequent dark bloody stools.  Patient is an extremely poor historian due to known history of mild cognitive impairment and therefore the majority the history has been obtained from the daughter who is at the bedside in addition to review of emergency department records from [facility] in [place].  Patient resides in [place] with one of her daughters.  The remaining 7 of her children live elsewhere with several of them living in [facility].  According to the daughter, approximately 8 days ago patient began to exhibit frequent bouts of dark bloody stools.  These black bloody stools have been occurring essentially daily since onset.  Symptoms continue to worsen in the days that followed and became associated with generalized weakness, fatigue and dyspnea on exertion.  Patient also experienced occasional episodes of chest discomfort although further details could not be obtained.  The symptoms continue to worsen until the patient eventually presented to the [place] emergency department on 12/30.  Upon arrival, patient was noted to be hypotensive with initial hemoglobin of 5.3.  Patient was managed with intravenous Protonix, intravenous octreotide infusion, and 3 units of packed red blood cell transfusion.  Patient clinically stabilized with systolic blood  pressures remaining essentially in the 90s throughout the emergency department stay.    Emergency department medical records state that attempts were made to transfer the patient to another healthcare facility with gastroenterology coverage.  Unfortunately, after waiting for nearly 4 days no availability could be found in the surrounding hospitals.  Emergency department records note that the patient and family then left the emergency department [place].  This account differs from what the daughter tells me.  She states that they were discharged from the emergency department and were told to find care elsewhere.  She states that they requested transfer to another facility but were told by emergency department providers that it was "illegal" for her mother to be transferred to another facility.   Upon arrival to [facility] emergency department, patient is continuing to experience bouts of dark bloody stools.  Patient is complaining of vague left lower quadrant pain.  Hemoglobin upon arrival to the emergency department is 9.7.  The hospitalist group is now been called to assess the patient for mission the hospital.  Interim history Patient admitted with GI bleed, gastroenterology consulted, status post colonoscopy today. Assessment & Plan   Acute blood loss anemia/GI bleed -Patient reported approximately 1 day history of intermittent bouts of dark bloody stools -Was placed on IV Protonix every 12 hours -Patient was given 3 units of blood in outside facility as well as 1 unit of PRBCs on 05/16/2020  -Hemoglobin currently 10.1 -Gastroenterology consulted and appreciated status post Colonoscopy showed blood in the entire colon.  Single actively bleeding colonic angiectasia, treated with argon plasma coagulation and 1 clip placed.  Diverticulosis in the sigmoid colon.  Recommendations to avoid NSAIDs.  Would hold Xarelto for additional 2 days and discussed concerning long-term risk  versus benefit of Xarelto with prescribing provider as appropriate.  If hemoglobin is stable patient could possibly discharge on 05/18/2020. -continue to monitor H/H  Hyperlipidemia -Continue statin  Chronic combined systolic and diastolic heart failure -Currently appears to be euvolemic and compensated -Last EF was 30 to 79% with complications of congestive cirrhosis -Continue to monitor intake and output, daily weights  Chronic anticoagulation -Per daughter at bedside, patient was placed on this medication while hospitalized in September, suspected to be due to multiple CVAs.  When asked about atrial fibrillation or irregular heartbeat, both patient and daughter were unaware of such a diagnosis. -Question whether Xarelto is truly needed at this point but feel that patient should discuss this with her prescribing physician. -Patient is no longer on Plavix and it appears that aspirin has been discontinued as well  Thrombocytopenia -Presumably from recent diagnosis of cirrhosis felt to be secondary to advanced CHF -Stable  Chronic kidney disease, stage IIIb -Creatinine appears to be stable  GERD -Continue PPI  Hyperkalemia -Resolved   DVT Prophylaxis  SCDs  Code Status: Full  Family Communication: Daughter at bedside  Disposition Plan:  Status is: Inpatient  Remains inpatient appropriate because:monitor H/H   Dispo: The patient is from: Home              Anticipated d/c is to: Home              Anticipated d/c date is: 1 day              Patient currently is not medically stable to d/c.  Consultants Gastroenterology  Procedures  Colonoscopy  Antibiotics   Anti-infectives (From admission, onward)   None      Subjective:   Allison Gardner seen and examined today.  Hoping to go home today.  Denies current chest pain or shortness of breath, abdominal pain, nausea or vomiting, dizziness or headache.  Objective:   Vitals:   05/17/20 0950 05/17/20 0955  05/17/20 1020 05/17/20 1150  BP: (!) 97/39 (!) 109/36 (!) 89/68 (!) 100/46  Pulse: 86 81 78   Resp: 15 (!) 22 20   Temp:   97.7 F (36.5 C) 97.8 F (36.6 C)  TempSrc:   Axillary Axillary  SpO2:  100% 100%   Weight:      Height:        Intake/Output Summary (Last 24 hours) at 05/17/2020 1445 Last data filed at 05/17/2020 3903 Gross per 24 hour  Intake 810 ml  Output 500 ml  Net 310 ml   Filed Weights   05/16/20 0429 05/17/20 0637 05/17/20 0752  Weight: 49 kg 48 kg 48 kg    Exam  General: Well developed, elderly, no acute distress  HEENT: NCAT,  mucous membranes moist.   Cardiovascular: S1 S2 auscultated, no rubs, murmurs or gallops. Regular rate and rhythm.  Respiratory: Clear to auscultation bilaterally with equal chest rise  Abdomen: Soft, nontender, nondistended, + bowel sounds  Extremities: warm dry without cyanosis clubbing or edema  Neuro: AAOx3, nonfocal  Psych: appropriate mood and affect   Data Reviewed: I have personally reviewed following labs and imaging studies  CBC: Recent Labs  Lab 05/15/20 0528 05/15/20 1528 05/15/20 2130 05/16/20 0347 05/16/20 1219 05/17/20 0033  WBC 4.2 3.8* 3.5* 3.3*  --  4.9  HGB 7.9* 7.9* 7.6* 6.7* 8.5* 10.1*  HCT 24.3* 25.9* 25.8* 22.4* 26.2* 32.4*  MCV 94.6 97.7 102.0* 99.1  --  92.8  PLT 137* 150 141* 142*  --  468   Basic Metabolic Panel: Recent Labs  Lab 05/14/20 2107 05/15/20 0528 05/16/20 0347 05/17/20 0033 05/17/20 0410  NA 144 141 141 138  --   K 5.6* 4.8 4.6 4.7  --   CL 115* 115* 116* 111  --   CO2 20* 20* 20* 19*  --   GLUCOSE 71 75 73 68*  --   BUN 27* 27* 24* 21  --   CREATININE 1.36* 1.36* 1.27* 1.39*  --   CALCIUM 8.7* 8.2* 7.9* 8.2*  --   MG  --   --   --   --  1.8   GFR: Estimated Creatinine Clearance: 17.4 mL/min (A) (by C-G formula based on SCr of 1.39 mg/dL (H)). Liver Function Tests: Recent Labs  Lab 05/14/20 2107 05/15/20 0528 05/17/20 0033  AST 29 20 24   ALT 10 9 8    ALKPHOS 116 108 108  BILITOT 1.4* 1.2 1.1  PROT 5.3* 4.6* 4.9*  ALBUMIN 2.8* 2.5* 2.6*   No results for input(s): LIPASE, AMYLASE in the last 168 hours. No results for input(s): AMMONIA in the last 168 hours. Coagulation Profile: Recent Labs  Lab 05/15/20 0528 05/17/20 0410  INR 1.3* 1.3*   Cardiac Enzymes: No results for input(s): CKTOTAL, CKMB, CKMBINDEX, TROPONINI in the last 168 hours. BNP (last 3 results) No results for input(s): PROBNP in the last 8760 hours. HbA1C: No results for input(s): HGBA1C in the last 72 hours. CBG: No results for input(s): GLUCAP in the last 168 hours. Lipid Profile: No results for input(s): CHOL, HDL, LDLCALC, TRIG, CHOLHDL, LDLDIRECT in the last 72 hours. Thyroid Function Tests: Recent Labs    05/15/20 0528  TSH 0.614   Anemia Panel: No results for input(s): VITAMINB12, FOLATE, FERRITIN, TIBC, IRON, RETICCTPCT in the last 72 hours. Urine analysis:    Component Value Date/Time   COLORURINE YELLOW 12/22/2008 0404   APPEARANCEUR CLEAR 12/22/2008 0404   LABSPEC 1.018 12/22/2008 0404   PHURINE 5.0 12/22/2008 0404   GLUCOSEU NEGATIVE 12/22/2008 0404   HGBUR NEGATIVE 12/22/2008 0404   BILIRUBINUR NEGATIVE 12/22/2008 0404   KETONESUR NEGATIVE 12/22/2008 0404   PROTEINUR NEGATIVE 12/22/2008 0404   UROBILINOGEN 0.2 12/22/2008 0404   NITRITE NEGATIVE 12/22/2008 0404   LEUKOCYTESUR NEGATIVE 12/22/2008 0404   Sepsis Labs: @LABRCNTIP (procalcitonin:4,lacticidven:4)  ) Recent Results (from the past 240 hour(s))  Resp Panel by RT-PCR (Flu A&B, Covid) Nasopharyngeal Swab     Status: None   Collection Time: 05/15/20 12:38 AM   Specimen: Nasopharyngeal Swab; Nasopharyngeal(NP) swabs in vial transport medium  Result Value Ref Range Status   SARS Coronavirus 2 by RT PCR NEGATIVE NEGATIVE Final    Comment: (NOTE) SARS-CoV-2 target nucleic acids are NOT DETECTED.  The SARS-CoV-2 RNA is generally detectable in upper respiratory specimens during  the acute phase of infection. The lowest concentration of SARS-CoV-2 viral copies this assay can detect is 138 copies/mL. A negative result does not preclude SARS-Cov-2 infection and should not be used as the sole basis for treatment or other patient management decisions. A negative result may occur with  improper specimen collection/handling, submission of specimen other than nasopharyngeal swab, presence of viral mutation(s) within the areas targeted by this assay, and inadequate number of viral copies(<138 copies/mL). A negative result must be combined with clinical observations, patient history, and epidemiological information. The expected result is Negative.  Fact Sheet for Patients:  EntrepreneurPulse.com.au  Fact Sheet for Healthcare Providers:  IncredibleEmployment.be  This test is no t yet approved or  cleared by the [place] and  has been authorized for detection and/or diagnosis of SARS-CoV-2 by FDA under an Emergency Use Authorization (EUA). This EUA will remain  in effect (meaning this test can be used) for the duration of the COVID-19 declaration under Section 564(b)(1) of the Act, 21 U.S.C.section 360bbb-3(b)(1), unless the authorization is terminated  or revoked sooner.       Influenza A by PCR NEGATIVE NEGATIVE Final   Influenza B by PCR NEGATIVE NEGATIVE Final    Comment: (NOTE) The Xpert Xpress SARS-CoV-2/FLU/RSV plus assay is intended as an aid in the diagnosis of influenza from Nasopharyngeal swab specimens and should not be used as a sole basis for treatment. Nasal washings and aspirates are unacceptable for Xpert Xpress SARS-CoV-2/FLU/RSV testing.  Fact Sheet for Patients: EntrepreneurPulse.com.au  Fact Sheet for Healthcare Providers: IncredibleEmployment.be  This test is not yet approved or cleared by the [place] FDA and has been authorized for detection and/or  diagnosis of SARS-CoV-2 by FDA under an Emergency Use Authorization (EUA). This EUA will remain in effect (meaning this test can be used) for the duration of the COVID-19 declaration under Section 564(b)(1) of the Act, 21 U.S.C. section 360bbb-3(b)(1), unless the authorization is terminated or revoked.  Performed at [facility], [place] [address] [place], [place] [address]   MRSA PCR Screening     Status: None   Collection Time: 05/16/20  4:29 AM   Specimen: Nasopharyngeal  Result Value Ref Range Status   MRSA by PCR NEGATIVE NEGATIVE Final    Comment:        The GeneXpert MRSA Assay (FDA approved for NASAL specimens only), is one component of a comprehensive MRSA colonization surveillance program. It is not intended to diagnose MRSA infection nor to guide or monitor treatment for MRSA infections. Performed at [facility], [place] [address], [place], [place] [address]       Radiology Studies: No results found.   Scheduled Meds: . furosemide  20 mg Oral Daily  . levothyroxine  25 mcg Oral Daily  . [START ON 05/18/2020] pantoprazole  40 mg Oral QAC breakfast  . polyethylene glycol  4,000 mL Oral Once  . rosuvastatin  5 mg Oral QHS   Continuous Infusions:   LOS: 2 days   Time Spent in minutes   45 minutes  Tacara Hadlock D.O. on 05/17/2020 at 2:45 PM  Between 7am to 7pm - Please see pager noted on [email or web address]  After 7pm go to [email or web address]  And look for the night coverage person covering for me after hours  [facility] Office  [phone]

## 2020-05-17 NOTE — Progress Notes (Signed)
Patient able to complete roughly 70-80% of bowel prep. Educated on the importance of 100% completion and patient understanding of reason for bowel prep.

## 2020-05-17 NOTE — Anesthesia Procedure Notes (Signed)
Procedure Name: MAC Date/Time: 05/17/2020 8:49 AM Performed by: Imagene Riches, CRNA Pre-anesthesia Checklist: Patient identified, Emergency Drugs available, Suction available, Patient being monitored and Timeout performed Patient Re-evaluated:Patient Re-evaluated prior to induction Oxygen Delivery Method: Nasal cannula

## 2020-05-17 NOTE — Progress Notes (Signed)
PT Cancellation Note  Patient Details Name: TENLEY WINWARD MRN: [id number] DOB: 1922/11/08   Cancelled Treatment:    Reason Eval/Treat Not Completed: (P) Patient at procedure or test/unavailable Pt off floor for procedure. PT will follow back later today as able.   Ersie Savino B. Migdalia Dk PT, DPT Acute Rehabilitation Services Pager [phone] Office [phone]    [place] 05/17/2020, 8:20 AM

## 2020-05-17 NOTE — Interval H&P Note (Signed)
History and Physical Interval Note: For EGD and colon today to eval recent GI bleeding and also to exclude UGI bleeding given history of cirrhosis seen by CT scan. Hgb much improved today after additional pRBCs yesterday. HIGHER THAN BASELINE RISK.The nature of the procedure, as well as the risks, benefits, and alternatives were carefully and thoroughly reviewed with the patient. Ample time for discussion and questions allowed. The patient understood, was satisfied, and agreed to proceed.   CBC Latest Ref Rng & Units 05/17/2020 05/16/2020 05/16/2020  WBC 4.0 - 10.5 K/uL 4.9 - 3.3(L)  Hemoglobin 12.0 - 15.0 g/dL 10.1(L) 8.5(L) 6.7(LL)  Hematocrit 36.0 - 46.0 % 32.4(L) 26.2(L) 22.4(L)  Platelets 150 - 400 K/uL 180 - 142(L)     05/17/2020 8:41 AM  Allison Gardner  has presented today for surgery, with the diagnosis of hemamtochzia, anemia.  The various methods of treatment have been discussed with the patient and family. After consideration of risks, benefits and other options for treatment, the patient has consented to  Procedure(s): COLONOSCOPY WITH PROPOFOL (N/A) ESOPHAGOGASTRODUODENOSCOPY (EGD) WITH PROPOFOL (N/A) as a surgical intervention.  The patient's history has been reviewed, patient examined, no change in status, stable for surgery.  I have reviewed the patient's chart and labs.  Questions were answered to the patient's satisfaction.     Lajuan Lines Sorcha Rotunno

## 2020-05-17 NOTE — Evaluation (Signed)
Physical Therapy Evaluation Patient Details Name: Allison Gardner MRN: [id number] DOB: [birth year] Today's Date: 05/17/2020   History of Present Illness  87 year old female with past medical history of intracranial hemorrhage (2012), multiple ischemic strokes in the past, hypertension, peripheral vascular disease, systolic and diastolic congestive heart failure ( Echo 01/2020 EF 30-35%), cirrhosis (felt to be due to CHF), hyperlipidemia, gastroesophageal reflux disease, mild cognitive impairment, migraine headaches who presents ED 05/14/20 due to complaints of frequent dark bloody stools. Admitted for lower GI bleed, and fiven 1 unit PRBC s/p 1/5 colonoscopy where a single actively bleeding colonic andioectasia was clipped  Clinical Impression  PTA pt living with son in home with basement and 2 steps to enter, bed and bath on first floor. Pt instructed to not go in basement by family, however she admits she goes down there occasionally. Pt reports ambulation in home with cane or RW and independence with bathing and dressing. Family is not always available, however she has been instructed not to get in tub without someone being home. Pt and daughter report numerous falls. Encouraged daughter to insure 24 hour care in future for pt safety. Pt is currently limited in safe mobility by symptomatic low BP, dizziness with movement, as well as decreased strength and endurance. Pt is supervision for bed mobility and min A for transfers and ambulation with RW. PT recommending HHPT at discharge to improve safety with mobility in her home environment. PT will continue to follow acutely.      Follow Up Recommendations Home health PT;Supervision/Assistance - 24 hour    Equipment Recommendations  None recommended by PT (has needed equipment)    Recommendations for Other Services OT consult     Precautions / Restrictions Precautions Precautions: Fall Precaution Comments: history of falling Restrictions Weight  Bearing Restrictions: No      Mobility  Bed Mobility Overal bed mobility: Needs Assistance Bed Mobility: Supine to Sit     Supine to sit: Supervision     General bed mobility comments: vc for use of bedrail to pull to EoB,    Transfers Overall transfer level: Needs assistance   Transfers: Sit to/from Stand Sit to Stand: Min assist         General transfer comment: min A for power up and steadying c/o minor dizziness  Ambulation/Gait Ambulation/Gait assistance: Min assist Gait Distance (Feet): 3 Feet Assistive device: Rolling walker (2 wheeled) Gait Pattern/deviations: Step-through pattern;Decreased step length - right;Decreased step length - left;Trunk flexed Gait velocity: slowed Gait velocity interpretation: <1.31 ft/sec, indicative of household ambulator General Gait Details: min A for steadying with walking to recliner deferred further ambulation due to low BP and drainage post endoscopic procedure this morning         Balance Overall balance assessment: Needs assistance Sitting-balance support: Feet supported;No upper extremity supported Sitting balance-Leahy Scale: Fair     Standing balance support: Single extremity supported;Bilateral upper extremity supported;During functional activity Standing balance-Leahy Scale: Poor Standing balance comment: requires at least single UE support                             Pertinent Vitals/Pain Pain Assessment: No/denies pain    Home Living Family/patient expects to be discharged to:: Private residence Living Arrangements: Children Available Help at Discharge: Family;Available 24 hours/day;Other (Comment) (almost all the time) Type of Home: House Home Access: Stairs to enter Entrance Stairs-Rails: None Entrance Stairs-Number of Steps: 2 Home Layout: Laundry or work area  in basement;Multi-level (told not to go to basement) Home Equipment: Shower seat;Walker - 2 wheels;Cane - single point       Prior Function Level of Independence: Needs assistance   Gait / Transfers Assistance Needed: ambulates with cane/RW household distanced  ADL's / Homemaking Assistance Needed: bathes and dresses herself, fixes coffee family provides for the rest of her iADLs           Extremity/Trunk Assessment   Upper Extremity Assessment Upper Extremity Assessment: Generalized weakness;LUE deficits/detail    Lower Extremity Assessment Lower Extremity Assessment: LLE deficits/detail;Generalized weakness LLE Deficits / Details: L knee lacks full extension and flexion    Cervical / Trunk Assessment Cervical / Trunk Assessment: Kyphotic  Communication   Communication: No difficulties  Cognition Arousal/Alertness: Awake/alert Behavior During Therapy: WFL for tasks assessed/performed Overall Cognitive Status: Within Functional Limits for tasks assessed                                 General Comments: decreased safety awareness at baseline, does not follow family instructions      General Comments General comments (skin integrity, edema, etc.): low BP supine 87/42 sitting 84/51/ after ambulation to chair and exercise 85/46    Exercises General Exercises - Lower Extremity Ankle Circles/Pumps: AROM;Both;10 reps;Seated Long Arc Quad: AROM;Both;10 reps;Seated Hip Flexion/Marching: AROM;Both;10 reps;Seated   Assessment/Plan    PT Assessment Patient needs continued PT services  PT Problem List Decreased strength;Decreased activity tolerance;Decreased balance;Decreased mobility;Decreased safety awareness;Cardiopulmonary status limiting activity       PT Treatment Interventions DME instruction;Gait training;Functional mobility training;Therapeutic activities;Therapeutic exercise;Stair training;Balance training;Cognitive remediation;Patient/family education    PT Goals (Current goals can be found in the Care Plan section)  Acute Rehab PT Goals Patient Stated Goal: get back home  to sew PT Goal Formulation: With patient/family Time For Goal Achievement: 05/31/20 Potential to Achieve Goals: Fair    Frequency Min 3X/week    AM-PAC PT "6 Clicks" Mobility  Outcome Measure Help needed turning from your back to your side while in a flat bed without using bedrails?: None Help needed moving from lying on your back to sitting on the side of a flat bed without using bedrails?: None Help needed moving to and from a bed to a chair (including a wheelchair)?: A Little Help needed standing up from a chair using your arms (e.g., wheelchair or bedside chair)?: A Little Help needed to walk in hospital room?: A Little Help needed climbing 3-5 steps with a railing? : A Lot 6 Click Score: 19    End of Session Equipment Utilized During Treatment: Gait belt Activity Tolerance: Treatment limited secondary to medical complications (Comment) (low symptomatic BP (dizziness)) Patient left: in chair;with call bell/phone within reach;with chair alarm set;with family/visitor present Nurse Communication: Mobility status PT Visit Diagnosis: Unsteadiness on feet (R26.81);Other abnormalities of gait and mobility (R26.89);Muscle weakness (generalized) (M62.81);Difficulty in walking, not elsewhere classified (R26.2);History of falling (Z91.81);Repeated falls (R29.6);Dizziness and giddiness (R42)    Time: 1219-7588 PT Time Calculation (min) (ACUTE ONLY): 41 min   Charges:   PT Evaluation $PT Eval Moderate Complexity: 1 Mod PT Treatments $Gait Training: 8-22 mins $Therapeutic Exercise: 8-22 mins        Marcos Peloso B. Migdalia Dk PT, DPT Acute Rehabilitation Services Pager [phone] Office [phone]   [place] 05/17/2020, 2:00 PM

## 2020-05-18 DIAGNOSIS — K922 Gastrointestinal hemorrhage, unspecified: Secondary | ICD-10-CM | POA: Diagnosis not present

## 2020-05-18 DIAGNOSIS — D62 Acute posthemorrhagic anemia: Secondary | ICD-10-CM | POA: Diagnosis not present

## 2020-05-18 DIAGNOSIS — K5521 Angiodysplasia of colon with hemorrhage: Secondary | ICD-10-CM | POA: Diagnosis not present

## 2020-05-18 DIAGNOSIS — Z7901 Long term (current) use of anticoagulants: Secondary | ICD-10-CM | POA: Diagnosis not present

## 2020-05-18 LAB — BASIC METABOLIC PANEL
Anion gap: 7 (ref 5–15)
BUN: 17 mg/dL (ref 8–23)
CO2: 23 mmol/L (ref 22–32)
Calcium: 8.4 mg/dL — ABNORMAL LOW (ref 8.9–10.3)
Chloride: 111 mmol/L (ref 98–111)
Creatinine, Ser: 1.61 mg/dL — ABNORMAL HIGH (ref 0.44–1.00)
GFR, Estimated: 29 mL/min — ABNORMAL LOW (ref >60)
Glucose, Bld: 83 mg/dL (ref 70–99)
Potassium: 4.1 mmol/L (ref 3.5–5.1)
Sodium: 141 mmol/L (ref 135–145)

## 2020-05-18 LAB — CBC
HCT: 31.5 % — ABNORMAL LOW (ref 36.0–46.0)
Hemoglobin: 9.5 g/dL — ABNORMAL LOW (ref 12.0–15.0)
MCH: 28.4 pg (ref 26.0–34.0)
MCHC: 30.2 g/dL (ref 30.0–36.0)
MCV: 94.3 fL (ref 80.0–100.0)
Platelets: 179 10*3/uL (ref 150–400)
RBC: 3.34 MIL/uL — ABNORMAL LOW (ref 3.87–5.11)
RDW: 20.1 % — ABNORMAL HIGH (ref 11.5–15.5)
WBC: 4.5 10*3/uL (ref 4.0–10.5)
nRBC: 0 % (ref 0.0–0.2)

## 2020-05-18 LAB — PREPARE RBC (CROSSMATCH)

## 2020-05-18 LAB — HEMOGLOBIN AND HEMATOCRIT, BLOOD
HCT: 27.8 % — ABNORMAL LOW (ref 36.0–46.0)
HCT: 31.5 % — ABNORMAL LOW (ref 36.0–46.0)
Hemoglobin: 8.6 g/dL — ABNORMAL LOW (ref 12.0–15.0)
Hemoglobin: 10.5 g/dL — ABNORMAL LOW (ref 12.0–15.0)

## 2020-05-18 MED ORDER — SODIUM CHLORIDE 0.9 % IV BOLUS
250.0000 mL | Freq: Once | INTRAVENOUS | Status: AC
  Administered 2020-05-18: 250 mL via INTRAVENOUS

## 2020-05-18 MED ORDER — SODIUM CHLORIDE 0.9 % IV SOLN: INTRAVENOUS | Status: AC

## 2020-05-18 MED ORDER — SODIUM CHLORIDE 0.9% IV SOLUTION: Freq: Once | INTRAVENOUS | Status: AC

## 2020-05-18 NOTE — Anesthesia Postprocedure Evaluation (Signed)
Anesthesia Post Note  Patient: Allison Gardner  Procedure(s) Performed: COLONOSCOPY WITH PROPOFOL (N/A ) ESOPHAGOGASTRODUODENOSCOPY (EGD) WITH PROPOFOL (N/A ) HOT HEMOSTASIS (ARGON PLASMA COAGULATION/BICAP) (N/A ) HEMOSTASIS CLIP PLACEMENT     Patient location during evaluation: PACU Anesthesia Type: MAC Level of consciousness: awake and alert Pain management: pain level controlled Vital Signs Assessment: post-procedure vital signs reviewed and stable Respiratory status: spontaneous breathing, nonlabored ventilation and respiratory function stable Cardiovascular status: stable and blood pressure returned to baseline Anesthetic complications: no   No complications documented.  Last Vitals:  Vitals:   05/18/20 0300 05/18/20 0436  BP:  (!) 93/44  Pulse: 77 82  Resp:  16  Temp:  36.7 C  SpO2: 100% 99%    Last Pain:  Vitals:   05/18/20 0830  TempSrc:   PainSc: 0-No pain                 Audry Pili

## 2020-05-18 NOTE — Progress Notes (Addendum)
  Pt with BP readings 60-70s/30s lying in bed at start of OT session. Repeated on opposite arm and reclined HOB. Did not attempt any mobilizing this session. Pt also c/o headache. Notified nursing. Full OT note to follow.  Tyrone Schimke, OT Acute Rehabilitation Services Pager: [phone] Office: [phone]

## 2020-05-18 NOTE — Plan of Care (Signed)
  Problem: Education: Goal: Knowledge of General Education information will improve Description: Including pain rating scale, medication(s)/side effects and non-pharmacologic comfort measures Outcome: Progressing   Problem: Health Behavior/Discharge Planning: Goal: Ability to manage health-related needs will improve Outcome: Progressing   Problem: Clinical Measurements: Goal: Ability to maintain clinical measurements within normal limits will improve Outcome: Progressing   Problem: Clinical Measurements: Goal: Will remain free from infection Outcome: Progressing   Problem: Clinical Measurements: Goal: Diagnostic test results will improve Outcome: Progressing   Problem: Clinical Measurements: Goal: Cardiovascular complication will be avoided Outcome: Progressing   Problem: Nutrition: Goal: Adequate nutrition will be maintained Outcome: Progressing   Problem: Pain Managment: Goal: General experience of comfort will improve Outcome: Progressing   Problem: Education: Goal: Ability to identify signs and symptoms of gastrointestinal bleeding will improve Outcome: Progressing   Problem: Bowel/Gastric: Goal: Will show no signs and symptoms of gastrointestinal bleeding Outcome: Progressing   Problem: Fluid Volume: Goal: Will show no signs and symptoms of excessive bleeding Outcome: Progressing   Problem: Clinical Measurements: Goal: Complications related to the disease process, condition or treatment will be avoided or minimized Outcome: Progressing

## 2020-05-18 NOTE — Progress Notes (Signed)
   05/18/20 1100  Clinical Encounter Type  Visited With Patient  Visit Type Initial  Referral From Nurse  Spiritual Encounters  Spiritual Needs Literature  The chaplain took POA paperwork. The patient was a bit disoriented. The chaplain left a copy of the POA paperwork for the daughter to review and the chaplain will follow up as needed.

## 2020-05-18 NOTE — Evaluation (Signed)
Occupational Therapy Evaluation Patient Details Name: Allison Gardner MRN: [id number] DOB: June 14, 1922 Today's Date: 05/18/2020    History of Present Illness 87 year old female with past medical history of intracranial hemorrhage (2012), multiple ischemic strokes in the past, hypertension, peripheral vascular disease, systolic and diastolic congestive heart failure ( Echo 01/2020 EF 30-35%), cirrhosis (felt to be due to CHF), hyperlipidemia, gastroesophageal reflux disease, mild cognitive impairment, migraine headaches who presents ED 05/14/20 due to complaints of frequent dark bloody stools. Admitted for lower GI bleed, and fiven 1 unit PRBC s/p 1/5 colonoscopy where a single actively bleeding colonic andioectasia was clipped   Clinical Impression   Pt admitted with the above diagnoses and presents with below problem list. Pt will benefit from continued acute OT to address the below listed deficits and maximize independence with basic ADLs prior to d/c to venue below. At baseline, pt is mod I to supervision with basic ADLs. Limited, bed level OT evaluation as pt was found to have bp readings 60s-70s/30s resting in bed. Pt also c/o headache. Nursing notified. Lowered HOB and repeated bp reading with no significant change. Per chart review, including PT note from yesterday, pt is currently at least mod A with LB ADLs, min A UB ADLs, has only been able to tolerate pivotal steps (3') so far.     Follow Up Recommendations  Supervision/Assistance - 24 hour ; [place] OT   Equipment Recommendations  3 in 1 bedside commode    Recommendations for Other Services       Precautions / Restrictions Precautions Precautions: Fall Precaution Comments: history of falling Restrictions Weight Bearing Restrictions: No      Mobility Bed Mobility                    Transfers                      Balance                                           ADL either performed or  assessed with clinical judgement   ADL Overall ADL's : Needs assistance/impaired Eating/Feeding: Set up;Sitting   Grooming: Sitting;Minimal assistance   Upper Body Bathing: Minimal assistance;Sitting;Set up   Lower Body Bathing: Moderate assistance;Sit to/from stand   Upper Body Dressing : Minimal assistance;Sitting   Lower Body Dressing: Moderate assistance;Sit to/from stand                 General ADL Comments: Bed level eval d/t low bp and headache at start of session. Assist levels above per chart review and clinical judgement.     Vision         Perception     Praxis      Pertinent Vitals/Pain Pain Assessment: Faces Faces Pain Scale: Hurts even more Pain Location: headache Pain Descriptors / Indicators: Aching Pain Intervention(s): Other (comment);Monitored during session;Limited activity within patient's tolerance (Notified nursing)     Hand Dominance     Extremity/Trunk Assessment Upper Extremity Assessment Upper Extremity Assessment: Generalized weakness   Lower Extremity Assessment Lower Extremity Assessment: Defer to PT evaluation   Cervical / Trunk Assessment Cervical / Trunk Assessment: Kyphotic   Communication Communication Communication: No difficulties   Cognition Arousal/Alertness: Awake/alert (sleepy) Behavior During Therapy: WFL for tasks assessed/performed Overall Cognitive Status: Within Functional Limits for tasks assessed  General Comments: decreased safety awareness at baseline, does not follow family instructions   General Comments  3x BP readings 60s-70s/30s at start of session resting in supine. Pt also c/o headache. Notified nursing. Held mobility for now. Bed level OT eval.    Exercises     Shoulder Instructions      Home Living Family/patient expects to be discharged to:: Private residence Living Arrangements: Children Available Help at Discharge: Family;Available 24  hours/day;Other (Comment) (almost all the time) Type of Home: House Home Access: Stairs to enter [employer] of Steps: 2 Entrance Stairs-Rails: None Home Layout: Laundry or work area in Biomedical engineer of Steps: 12   Bathroom Shower/Tub: Teacher, early years/pre: Standard Bathroom Accessibility: Yes   Home Equipment: Clinical cytogeneticist - 2 wheels;Cane - single point          Prior Functioning/Environment Level of Independence: Needs assistance  Gait / Transfers Assistance Needed: ambulates with cane/RW household distanced ADL's / Homemaking Assistance Needed: bathes and dresses herself, fixes coffee family provides for the rest of her iADLs            OT Problem List: Decreased strength;Decreased activity tolerance;Impaired balance (sitting and/or standing);Decreased knowledge of use of DME or AE;Decreased knowledge of precautions;Decreased safety awareness;Cardiopulmonary status limiting activity;Pain      OT Treatment/Interventions: Self-care/ADL training;Energy conservation;DME and/or AE instruction;Therapeutic activities;Patient/family education;Balance training    OT Goals(Current goals can be found in the care plan section) Acute Rehab OT Goals Patient Stated Goal: 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 OT Goal Formulation: With patient Time For Goal Achievement: 06/01/20 Potential to Achieve Goals: Fair ADL Goals Pt Will Perform Grooming: with set-up;sitting Pt Will Perform Lower Body Dressing: sit to/from stand;with min guard assist Pt Will Transfer to Toilet: with min guard assist;ambulating Pt Will Perform Toileting - Clothing Manipulation and hygiene: with min guard assist;sit to/from stand Additional ADL Goal #1: Pt will complete bed mobility at supervision level to prepare for EOB/OOB ADLs.  OT Frequency: Min 2X/week   Barriers to D/C:    needs 24/7 supervision       Co-evaluation              AM-PAC OT "6  Clicks" Daily Activity     Outcome Measure Help from another person eating meals?: None Help from another person taking care of personal grooming?: A Little Help from another person toileting, which includes using toliet, bedpan, or urinal?: A Lot Help from another person bathing (including washing, rinsing, drying)?: A Lot Help from another person to put on and taking off regular upper body clothing?: A Little Help from another person to put on and taking off regular lower body clothing?: A Lot 6 Click Score: 16   End of Session Nurse Communication: Other (comment) (see general comments)  Activity Tolerance: Other (comment) (lower than her usual bp readings. Headache.) Patient left: in bed;with call bell/phone within reach;with bed alarm set  OT Visit Diagnosis: Unsteadiness on feet (R26.81);Other abnormalities of gait and mobility (R26.89);Muscle weakness (generalized) (M62.81);History of falling (Z91.81);Pain Pain - part of body:  (headache)                Time: 0630-1601 OT Time Calculation (min): 18 min Charges:  OT General Charges $OT Visit: 1 Visit OT Evaluation $OT Eval Moderate Complexity: [place], OT Acute Rehabilitation Services Pager: [phone] Office: [phone]   Hortencia Pilar 05/18/2020, 12:53 PM

## 2020-05-18 NOTE — Progress Notes (Addendum)
PROGRESS NOTE    Allison Gardner  DGL:[id number] DOB: September 02, 1922 DOA: 05/14/2020 PCP: Andres Shad, MD   Brief Narrative:  HPI on 05/15/2020 by Dr. Inda Merlin 87 year old female with past medical history of intracranial hemorrhage (2012), multiple ischemic strokes in the past, hypertension, peripheral vascular disease, systolic and diastolic congestive heart failure ( Echo 01/2020 EF 30-35%), cirrhosis (felt to be due to CHF), hyperlipidemia, gastroesophageal reflux disease, mild cognitive impairment, migraine headaches who presents to [facility] emergency department due to complaints of frequent dark bloody stools.  Patient is an extremely poor historian due to known history of mild cognitive impairment and therefore the majority the history has been obtained from the daughter who is at the bedside in addition to review of emergency department records from [facility] in [place].  Patient resides in [place] with one of her daughters.  The remaining 7 of her children live elsewhere with several of them living in [facility] [facility].  According to the daughter, approximately 8 days ago patient began to exhibit frequent bouts of dark bloody stools.  These black bloody stools have been occurring essentially daily since onset.  Symptoms continue to worsen in the days that followed and became associated with generalized weakness, fatigue and dyspnea on exertion.  Patient also experienced occasional episodes of chest discomfort although further details could not be obtained.  The symptoms continue to worsen until the patient eventually presented to the [place] emergency department on 12/30.  Upon arrival, patient was noted to be hypotensive with initial hemoglobin of 5.3.  Patient was managed with intravenous Protonix, intravenous octreotide infusion, and 3 units of packed red blood cell transfusion.  Patient clinically stabilized with systolic blood  pressures remaining essentially in the 90s throughout the emergency department stay.    Emergency department medical records state that attempts were made to transfer the patient to another healthcare facility with gastroenterology coverage.  Unfortunately, after waiting for nearly 4 days no availability could be found in the surrounding hospitals.  Emergency department records note that the patient and family then left the emergency department [place].  This account differs from what the daughter tells me.  She states that they were discharged from the emergency department and were told to find care elsewhere.  She states that they requested transfer to another facility but were told by emergency department providers that it was "illegal" for her mother to be transferred to another facility.   Upon arrival to [facility] emergency department, patient is continuing to experience bouts of dark bloody stools.  Patient is complaining of vague left lower quadrant pain.  Hemoglobin upon arrival to the emergency department is 9.7.  The hospitalist group is now been called to assess the patient for mission the hospital.  Interim history Patient admitted with GI bleed, gastroenterology consulted, status post colonoscopy today. Assessment & Plan   Acute blood loss anemia/GI bleed/ Symptomatic anemia -Patient reported approximately 1 day history of intermittent bouts of dark bloody stools -Was placed on IV Protonix every 12 hours -Patient was given 3 units of blood in outside facility as well as 1 unit of PRBCs on 05/16/2020  -Hemoglobin currently 9.5 -Gastroenterology consulted and appreciated status post Colonoscopy showed blood in the entire colon.  Single actively bleeding colonic angiectasia, treated with argon plasma coagulation and 1 clip placed.  Diverticulosis in the sigmoid colon.  Recommendations to avoid NSAIDs.  Would hold Xarelto for additional 2 days and discussed  concerning  long-term risk versus benefit of Xarelto with prescribing provider as appropriate.  If hemoglobin is stable patient could possibly discharge on 05/18/2020. -patient complaining of dizziness/lightheadedness as well headache. Also noted to have hypotension -Continue to monitor H/H -Unfortunately cannot give her an additional unit as her hemoglobin 9.5 -will place her on gentle IVF (given CHF) and monitor closely  Hyperlipidemia -Continue statin  Chronic combined systolic and diastolic heart failure -Currently appears to be euvolemic and compensated -Last EF was 30 to 54% with complications of congestive cirrhosis -Continue to monitor intake and output, daily weights  Chronic anticoagulation -Per daughter at bedside, patient was placed on this medication while hospitalized in September, suspected to be due to multiple CVAs.  When asked about atrial fibrillation or irregular heartbeat, both patient and daughter were unaware of such a diagnosis. -Question whether Xarelto is truly needed at this point but feel that patient should discuss this with her prescribing physician. -Patient is no longer on Plavix and it appears that aspirin has been discontinued as well  Thrombocytopenia -Presumably from recent diagnosis of cirrhosis felt to be secondary to advanced CHF -Stable  Chronic kidney disease, stage IIIb -Mild bump in creatinine today, currently 1.61.  Continue to monitor BMP closely  GERD -Continue PPI  Hyperkalemia -Resolved   DVT Prophylaxis  SCDs  Code Status: Full  Family Communication: Daughter at bedside  Disposition Plan:  Status is: Inpatient  Remains inpatient appropriate because:Hemodynamically unstable and monitor H/H   Dispo: The patient is from: Home              Anticipated d/c is to: Home              Anticipated d/c date is: 1 day              Patient currently is not medically stable to d/c.  Consultants Gastroenterology  Procedures   Colonoscopy  Antibiotics   Anti-infectives (From admission, onward)   None      Subjective:   Allison Gardner seen and examined today.  Hoping to go home today.  When working with OT patient became dizzy and hypotensive. Currently complains of headache. Denies chest pain, shortness of breath, N/V.   Objective:   Vitals:   05/18/20 1121 05/18/20 1125 05/18/20 1130 05/18/20 1135  BP: (!) 82/39     Pulse:  76 76 75  Resp:      Temp:      TempSrc:      SpO2:  100% 98% 100%  Weight:      Height:        Intake/Output Summary (Last 24 hours) at 05/18/2020 1141 Last data filed at 05/17/2020 2100 Gross per 24 hour  Intake 360 ml  Output 850 ml  Net -490 ml   Filed Weights   05/17/20 0637 05/17/20 0752 05/18/20 0600  Weight: 48 kg 48 kg 47.8 kg   Exam  General: Well developed, thin, chronically ill-appearing, NAD  HEENT: NCAT, mucous membranes moist.   Cardiovascular: S1 S2 auscultated, RRR.  Respiratory: Clear to auscultation bilaterally, no wheezing  Abdomen: Soft, nontender, nondistended, + bowel sounds  Extremities: warm dry without cyanosis clubbing or edema  Neuro: AAOx3, nonfocal  Psych: appropriate mood and affect   Data Reviewed: I have personally reviewed following labs and imaging studies  CBC: Recent Labs  Lab 05/15/20 1528 05/15/20 2130 05/16/20 0347 05/16/20 1219 05/17/20 0033 05/18/20 0809  WBC 3.8* 3.5* 3.3*  --  4.9 4.5  HGB 7.9* 7.6* 6.7*  8.5* 10.1* 9.5*  HCT 25.9* 25.8* 22.4* 26.2* 32.4* 31.5*  MCV 97.7 102.0* 99.1  --  92.8 94.3  PLT 150 141* 142*  --  180 683   Basic Metabolic Panel: Recent Labs  Lab 05/14/20 2107 05/15/20 0528 05/16/20 0347 05/17/20 0033 05/17/20 0410 05/18/20 0809  NA 144 141 141 138  --  141  K 5.6* 4.8 4.6 4.7  --  4.1  CL 115* 115* 116* 111  --  111  CO2 20* 20* 20* 19*  --  23  GLUCOSE 71 75 73 68*  --  83  BUN 27* 27* 24* 21  --  17  CREATININE 1.36* 1.36* 1.27* 1.39*  --  1.61*  CALCIUM 8.7*  8.2* 7.9* 8.2*  --  8.4*  MG  --   --   --   --  1.8  --    GFR: Estimated Creatinine Clearance: 15 mL/min (A) (by C-G formula based on SCr of 1.61 mg/dL (H)). Liver Function Tests: Recent Labs  Lab 05/14/20 2107 05/15/20 0528 05/17/20 0033  AST 29 20 24   ALT 10 9 8   ALKPHOS 116 108 108  BILITOT 1.4* 1.2 1.1  PROT 5.3* 4.6* 4.9*  ALBUMIN 2.8* 2.5* 2.6*   No results for input(s): LIPASE, AMYLASE in the last 168 hours. No results for input(s): AMMONIA in the last 168 hours. Coagulation Profile: Recent Labs  Lab 05/15/20 0528 05/17/20 0410  INR 1.3* 1.3*   Cardiac Enzymes: No results for input(s): CKTOTAL, CKMB, CKMBINDEX, TROPONINI in the last 168 hours. BNP (last 3 results) No results for input(s): PROBNP in the last 8760 hours. HbA1C: No results for input(s): HGBA1C in the last 72 hours. CBG: No results for input(s): GLUCAP in the last 168 hours. Lipid Profile: No results for input(s): CHOL, HDL, LDLCALC, TRIG, CHOLHDL, LDLDIRECT in the last 72 hours. Thyroid Function Tests: No results for input(s): TSH, T4TOTAL, FREET4, T3FREE, THYROIDAB in the last 72 hours. Anemia Panel: No results for input(s): VITAMINB12, FOLATE, FERRITIN, TIBC, IRON, RETICCTPCT in the last 72 hours. Urine analysis:    Component Value Date/Time   COLORURINE YELLOW 12/22/2008 0404   APPEARANCEUR CLEAR 12/22/2008 0404   LABSPEC 1.018 12/22/2008 0404   PHURINE 5.0 12/22/2008 0404   GLUCOSEU NEGATIVE 12/22/2008 0404   HGBUR NEGATIVE 12/22/2008 0404   BILIRUBINUR NEGATIVE 12/22/2008 0404   KETONESUR NEGATIVE 12/22/2008 0404   PROTEINUR NEGATIVE 12/22/2008 0404   UROBILINOGEN 0.2 12/22/2008 0404   NITRITE NEGATIVE 12/22/2008 0404   LEUKOCYTESUR NEGATIVE 12/22/2008 0404   Sepsis Labs: @LABRCNTIP (procalcitonin:4,lacticidven:4)  ) Recent Results (from the past 240 hour(s))  Resp Panel by RT-PCR (Flu A&B, Covid) Nasopharyngeal Swab     Status: None   Collection Time: 05/15/20 12:38 AM    Specimen: Nasopharyngeal Swab; Nasopharyngeal(NP) swabs in vial transport medium  Result Value Ref Range Status   SARS Coronavirus 2 by RT PCR NEGATIVE NEGATIVE Final    Comment: (NOTE) SARS-CoV-2 target nucleic acids are NOT DETECTED.  The SARS-CoV-2 RNA is generally detectable in upper respiratory specimens during the acute phase of infection. The lowest concentration of SARS-CoV-2 viral copies this assay can detect is 138 copies/mL. A negative result does not preclude SARS-Cov-2 infection and should not be used as the sole basis for treatment or other patient management decisions. A negative result may occur with  improper specimen collection/handling, submission of specimen other than nasopharyngeal swab, presence of viral mutation(s) within the areas targeted by this assay, and inadequate number of viral  copies(<138 copies/mL). A negative result must be combined with clinical observations, patient history, and epidemiological information. The expected result is Negative.  Fact Sheet for Patients:  EntrepreneurPulse.com.au  Fact Sheet for Healthcare Providers:  IncredibleEmployment.be  This test is no t yet approved or cleared by the [place] FDA and  has been authorized for detection and/or diagnosis of SARS-CoV-2 by FDA under an Emergency Use Authorization (EUA). This EUA will remain  in effect (meaning this test can be used) for the duration of the COVID-19 declaration under Section 564(b)(1) of the Act, 21 U.S.C.section 360bbb-3(b)(1), unless the authorization is terminated  or revoked sooner.       Influenza A by PCR NEGATIVE NEGATIVE Final   Influenza B by PCR NEGATIVE NEGATIVE Final    Comment: (NOTE) The Xpert Xpress SARS-CoV-2/FLU/RSV plus assay is intended as an aid in the diagnosis of influenza from Nasopharyngeal swab specimens and should not be used as a sole basis for treatment. Nasal washings and aspirates are  unacceptable for Xpert Xpress SARS-CoV-2/FLU/RSV testing.  Fact Sheet for Patients: EntrepreneurPulse.com.au  Fact Sheet for Healthcare Providers: IncredibleEmployment.be  This test is not yet approved or cleared by the [place] FDA and has been authorized for detection and/or diagnosis of SARS-CoV-2 by FDA under an Emergency Use Authorization (EUA). This EUA will remain in effect (meaning this test can be used) for the duration of the COVID-19 declaration under Section 564(b)(1) of the Act, 21 U.S.C. section 360bbb-3(b)(1), unless the authorization is terminated or revoked.  Performed at [facility], [place] [address], [place], [place] [address]   MRSA PCR Screening     Status: None   Collection Time: 05/16/20  4:29 AM   Specimen: Nasopharyngeal  Result Value Ref Range Status   MRSA by PCR NEGATIVE NEGATIVE Final    Comment:        The GeneXpert MRSA Assay (FDA approved for NASAL specimens only), is one component of a comprehensive MRSA colonization surveillance program. It is not intended to diagnose MRSA infection nor to guide or monitor treatment for MRSA infections. Performed at [facility], [place] [address] [place], [place] [address]       Radiology Studies: No results found.   Scheduled Meds: . sodium chloride   Intravenous Once  . furosemide  20 mg Oral Daily  . levothyroxine  25 mcg Oral Daily  . pantoprazole  40 mg Oral QAC breakfast  . polyethylene glycol  4,000 mL Oral Once  . rosuvastatin  5 mg Oral QHS   Continuous Infusions:   LOS: 3 days   Time Spent in minutes   45 minutes  Valentino Saavedra D.O. on 05/18/2020 at 11:41 AM  Between 7am to 7pm - Please see pager noted on [email or web address]  After 7pm go to [email or web address]  And look for the night coverage person covering for me after hours  [facility] Office  [phone]

## 2020-05-18 NOTE — Plan of Care (Signed)

## 2020-05-18 NOTE — Progress Notes (Signed)
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  Blood loss anemia  1 stool at 2100 last night, color/character not described.  Tolerating solid food.  Hypotensive currently in 70s/30s (baseline 80s-90s/30s-50s but pt says she does not feel dizzy or weak.    OBJECTIVE:         Vital signs in last 24 hours:    Temp:  [97.8 F (36.6 C)-98.7 F (37.1 C)] 98 F (36.7 C) (01/06 0436) Pulse Rate:  [77-89] 82 (01/06 0436) Resp:  [16-19] 16 (01/06 0436) BP: (91-100)/(44-52) 93/44 (01/06 0436) SpO2:  [99 %-100 %] 99 % (01/06 0436) Weight:  [47.8 kg] 47.8 kg (01/06 0600) Last BM Date: 05/17/20 Filed Weights   05/17/20 0637 05/17/20 0752 05/18/20 0600  Weight: 48 kg 48 kg 47.8 kg   General: frail, quiet, comfortable   Heart: RRR Chest: clear bil.   Abdomen: soft, NT, active BS, ND  Extremities: no CCE Neuro/Psych:  Alert, follows commands.  Not oriented to place, date.  Able to tell me what she had for breakfast.    Intake/Output from previous day: 01/05 0701 - 01/06 0700 In: 760 [P.O.:360; I.V.:400] Out: 850 [Urine:850]  Intake/Output this shift: No intake/output data recorded.  Lab Results: Recent Labs    05/16/20 0347 05/16/20 1219 05/17/20 0033 05/18/20 0809  WBC 3.3*  --  4.9 4.5  HGB 6.7* 8.5* 10.1* 9.5*  HCT 22.4* 26.2* 32.4* 31.5*  PLT 142*  --  180 179   BMET Recent Labs    05/16/20 0347 05/17/20 0033 05/18/20 0809  NA 141 138 141  K 4.6 4.7 4.1  CL 116* 111 111  CO2 20* 19* 23  GLUCOSE 73 68* 83  BUN 24* 21 17  CREATININE 1.27* 1.39* 1.61*  CALCIUM 7.9* 8.2* 8.4*   LFT Recent Labs    05/17/20 0033  PROT 4.9*  ALBUMIN 2.6*  AST 24  ALT 8  ALKPHOS 108  BILITOT 1.1   PT/INR Recent Labs    05/17/20 0410  LABPROT 15.6*  INR 1.3*   Hepatitis Panel Recent Labs    05/16/20 1219  HEPBSAG NON REACTIVE  HCVAB NON  REACTIVE    Studies/Results: No results found.   Scheduled Meds: . furosemide  20 mg Oral Daily  . levothyroxine  25 mcg Oral Daily  . pantoprazole  40 mg Oral QAC breakfast  . polyethylene glycol  4,000 mL Oral Once  . rosuvastatin  5 mg Oral QHS   Continuous Infusions: PRN Meds:.acetaminophen **OR** acetaminophen, ondansetron **OR** ondansetron (ZOFRAN) IV   ASSESMENT:   *  Painless hematochezia.   05/17/2020 EGD: Nonobstructing Schatzki's ring.  Nonbleeding, small gastric fundus diverticulum.  Normal examined duodenum.  No portal hypertensive gastropathy or varices.  No source for bleeding found. 05/17/2020 colonoscopy:   Blood throughout the colon.  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  Diverticulosis.  *    Blood loss anemia.  Hgb 6.7 >> 1 PRBC >> 9.5.  Received 3 PRBCs in [place] before leaving that hospital AMA.    *    Chronic Xarelto.  Indication, per family is CHF (EF 30%).  *   Cirrhosis of the liver seen on CT scan. HCV, Hep B surface Ab both negative.     PLAN   *   Soonest day to resume Xarelto will  be 1/8.  May want to rethink the risk-benefit ratio of Xarelto in this 87 year old patient w new dx of cirrhosis and AVMs.    *   No plans for ooutpt GI fup.  GI signing off.      Azucena Freed  05/18/2020, 10:59 AM Phone [phone]

## 2020-05-19 DIAGNOSIS — K5521 Angiodysplasia of colon with hemorrhage: Secondary | ICD-10-CM | POA: Diagnosis not present

## 2020-05-19 DIAGNOSIS — D62 Acute posthemorrhagic anemia: Secondary | ICD-10-CM | POA: Diagnosis not present

## 2020-05-19 LAB — BASIC METABOLIC PANEL
Anion gap: 6 (ref 5–15)
BUN: 12 mg/dL (ref 8–23)
CO2: 22 mmol/L (ref 22–32)
Calcium: 7.8 mg/dL — ABNORMAL LOW (ref 8.9–10.3)
Chloride: 114 mmol/L — ABNORMAL HIGH (ref 98–111)
Creatinine, Ser: 1.45 mg/dL — ABNORMAL HIGH (ref 0.44–1.00)
GFR, Estimated: 33 mL/min — ABNORMAL LOW (ref >60)
Glucose, Bld: 86 mg/dL (ref 70–99)
Potassium: 3.8 mmol/L (ref 3.5–5.1)
Sodium: 142 mmol/L (ref 135–145)

## 2020-05-19 LAB — TYPE AND SCREEN
ABO/RH(D): O POS
Antibody Screen: NEGATIVE
Unit division: 0

## 2020-05-19 LAB — HEMOGLOBIN AND HEMATOCRIT, BLOOD
HCT: 29.8 % — ABNORMAL LOW (ref 36.0–46.0)
Hemoglobin: 10 g/dL — ABNORMAL LOW (ref 12.0–15.0)

## 2020-05-19 LAB — BPAM RBC
Blood Product Expiration Date: 202202062359
ISSUE DATE / TIME: 202201061620
Unit Type and Rh: 5100

## 2020-05-19 MED ORDER — RIVAROXABAN 15 MG PO TABS: 15.0000 mg | ORAL_TABLET | Freq: Every day | ORAL | Status: DC

## 2020-05-19 MED ORDER — PANTOPRAZOLE SODIUM 40 MG PO TBEC: 40.0000 mg | DELAYED_RELEASE_TABLET | Freq: Every day | ORAL | 1 refills | Status: AC

## 2020-05-19 NOTE — TOC Progression Note (Signed)
Transition of Care [facility]) - Progression Note    Patient Details  Name: Allison Gardner MRN: [id number] Date of Birth: [birth year]  Transition of Care [facility]) CM/SW Contact  Zenon Mayo, RN Phone Number: 05/19/2020, 5:53 PM  Clinical Narrative:    NCM spoke with patient, she states to call her daughter Elnoria Howard,  [place] contacted Elnoria Howard, she states patient has had [facility] services in the past and would like to have them for [facility] services. The Weekend NCM of TOC team will follow.        Expected Discharge Plan and Services           Expected Discharge Date: 05/19/20                                     Social Determinants of Health (SDOH) Interventions    Readmission Risk Interventions No flowsheet data found.

## 2020-05-19 NOTE — Discharge Summary (Addendum)
Physician Discharge Summary  Allison Gardner PXT:[id number] DOB: [birth year] DOA: 05/14/2020  PCP: Andres Shad, MD  Admit date: 05/14/2020 Discharge date: 05/20/2020  Time spent: 45 minutes  Recommendations for Outpatient Follow-up:  Patient will be discharged to home with home health services.  Patient will need to follow up with primary care provider within one week of discharge, repeat CBC and discuss the need for anticoagulation.  Patient should continue medications as prescribed.  Patient should follow a soft diet.   Discharge Diagnoses:  Acute blood loss anemia/GI bleed/ Symptomatic anemia Hyperlipidemia Chronic combined systolic and diastolic heart failure Chronic anticoagulation Thrombocytopenia Chronic kidney disease, stage IIIb GERD Hyperkalemia  Discharge Condition: Stable  Diet recommendation: Soft  Filed Weights   05/17/20 0637 05/17/20 0752 05/18/20 0600  Weight: 48 kg 48 kg 47.8 kg    History of present illness:  on 05/15/2020 by Dr. Inda Merlin 87 year old female with past medical history of intracranial hemorrhage(2012),multiple ischemic strokes in the past, hypertension, peripheral vascular disease, systolic and diastolic congestive heart failure ( Echo 01/2020 EF 30-35%),cirrhosis(felt to be due to CHF),hyperlipidemia, gastroesophageal reflux disease, mild cognitive impairment, migraine headaches who presents to [facility] emergency department due to complaints of frequent dark bloody stools.  Patient is an extremely poor historian due to known history of mild cognitive impairment and therefore the majority the history has been obtained from the daughter who is at the bedside in addition to review of emergency department records [facility] in [place].  Patient resides in [place] with one of her daughters. The remaining 7 of her children live elsewhere with several of them living in [facility].  According to the daughter, approximately 8 days ago patient began to exhibit frequent bouts of dark bloody stools. These black bloody stools have been occurring essentially daily since onset. Symptoms continue to worsen in the days that followed and became associated withgeneralized weakness, fatigue and dyspnea on exertion. Patient also experienced occasional episodes of chest discomfort although further details could not be obtained.  The symptoms continue to worsen until the patient eventually presented to the [facility] emergency department on 12/30.Upon arrival, patient was noted to be hypotensive with initial hemoglobin of 5.3. Patient was managed with intravenous Protonix, intravenous octreotide infusion, and 3 units of packed red blood cell transfusion. Patient clinically stabilized with systolic blood pressures remaining essentially in the 90s throughout the emergency department stay.   Emergency department medical records state that attempts were made to transfer the patient to another healthcare facility with gastroenterology coverage. Unfortunately, after waiting for nearly 4 days no availability could be found in the surrounding hospitals. Emergency department records note that the patient and family then left the emergency department [place].  This account differs from what the daughter tells me.She states that they were discharged from the emergency department and were told to find care elsewhere. She states that they requested transfer to another facility but were told by emergency department providers that it was "illegal" for her mother to be transferred to another facility.  Upon arrival to [facility] emergency department, patient is continuing to experience bouts of dark bloody stools. Patient is complaining of vague left lower quadrant pain. Hemoglobin upon arrival to the emergency department is 9.7. The hospitalist group is  now been called to assess the patient for [facility] the hospital.  Hospital Course:  Acute blood loss anemia/GI bleed/ Symptomatic anemia -Patient reported approximately 1 day history of intermittent bouts of dark  bloody stools -Was placed on IV Protonix every 12 hours -Patient was given 3 units of blood in outside facility as well as 1 unit of PRBCs on 05/16/2020  -Gastroenterology consulted and appreciated status post Colonoscopy showed blood in the entire colon.  Single actively bleeding colonic angiectasia, treated with argon plasma coagulation and 1 clip placed.  Diverticulosis in the sigmoid colon.  Recommendations to avoid NSAIDs.  Would hold Xarelto for additional 2 days and discussed concerning long-term risk versus benefit of Xarelto with prescribing provider as appropriate.  If hemoglobin is stable patient could possibly discharge on 05/18/2020. -patient complaining of dizziness/lightheadedness as well headache. Also noted to have hypotension -Hemoglobin dropped to 8.6 on 05/19/2019 -Given with a drop in hemoglobin as well as continued symptoms, 1 unit PRBC transfused on 05/19/2019 -Hemoglobin currently stable at 10.5 -Repeat CBC in 1 week  Hyperlipidemia -Continue statin  Chronic combined systolic and diastolic heart failure -Currently appears to be euvolemic and compensated -Last EF was 30 to 41% with complications of congestive cirrhosis -Continue to monitor intake and output, daily weights -Given patient's soft blood pressure, would hold Coreg, Entresto and Lasix- need to be discussed with prescribing physician given her age and soft blood pressures  Chronic anticoagulation -Per daughter at bedside, patient was placed on this medication while hospitalized in September, suspected to be due to multiple CVAs.  When asked about atrial fibrillation or irregular heartbeat, both patient and daughter were unaware of such a diagnosis. -Question whether Xarelto is truly needed at this point but  feel that patient should discuss this with her prescribing physician. -Patient is no longer on Plavix and it appears that aspirin has been discontinued as well -Urged patient and family to speak to prescribing physician on the need for anticoagulation given her age, fragility and frequent falls  Thrombocytopenia -Presumably from recent diagnosis of cirrhosis felt to be secondary to advanced CHF -Stable  Chronic kidney disease, stage IIIb -Creatinine stable and at 1.45  GERD -Continue PPI  Hyperkalemia -Resolved  Procedures: Colonoscopy  Consultations: Gastroenterology  Discharge Exam: Vitals:   05/20/20 0300 05/20/20 0700  BP: 121/66 (!) 100/58  Pulse: (!) 107 77  Resp: 20 19  Temp: 98.6 F (37 C) 98.3 F (36.8 C)  SpO2: 99% 98%     General: Well developed, thin, chronically ill-appearing, NAD  HEENT: NCAT, mucous membranes moist.  Cardiovascular: S1 S2 auscultated, RRR.  Respiratory: Clear to auscultation bilaterally, no wheezing  Abdomen: Soft, nontender, nondistended, + bowel sounds  Extremities: warm dry without cyanosis clubbing or edema  Neuro: AAOx3, nonfocal  Psych: pleasant, appropriate mood and affect  Discharge Instructions Discharge Instructions    Diet - low sodium heart healthy   Complete by: As directed    Discharge instructions   Complete by: As directed    Patient will be discharged to home with home health services.  Patient will need to follow up with primary care provider within one week of discharge, repeat CBC and discuss the need for anticoagulation.  Patient should continue medications as prescribed.  Patient should follow a soft diet.   Discuss the need for Xarelto as well as lasix, Entresto, and coreg with your prescribing physician given that you are blood pressure has been very low.   Discharge wound care:   Complete by: As directed    Keep clean and dry   Increase activity slowly   Complete by: As directed       Allergies as of 05/20/2020  Reactions   Ampicillin    Demerol [meperidine] Other (See Comments)   Unknown reaction      Medication List    STOP taking these medications   carvedilol 3.125 MG tablet Commonly known as: COREG   Entresto 24-26 MG Generic drug: sacubitril-valsartan     TAKE these medications   acetaminophen 325 MG tablet Commonly known as: TYLENOL Take 650 mg by mouth every 6 (six) hours as needed (for pain).   acetaminophen-codeine 300-30 MG tablet Commonly known as: TYLENOL #3 Take 1 tablet by mouth every 4 (four) hours as needed for moderate pain.   Fish Oil 1000 MG Caps Take 1,000 mg by mouth every other day.   furosemide 20 MG tablet Commonly known as: LASIX Take 1 tablet (20 mg total) by mouth daily as needed. Take if weight gain of 2 lbs in one day or shortness of breath or swelling. What changed:   when to take this  reasons to take this  additional instructions   levothyroxine 25 MCG tablet Commonly known as: SYNTHROID Take 25 mcg by mouth daily.   pantoprazole 40 MG tablet Commonly known as: PROTONIX Take 1 tablet (40 mg total) by mouth daily before breakfast.   Rivaroxaban 15 MG Tabs tablet Commonly known as: XARELTO Take 1 tablet (15 mg total) by mouth daily. Discuss the need for this medication with your prescribing physician What changed: additional instructions   rosuvastatin 5 MG tablet Commonly known as: CRESTOR Take 5 mg by mouth at bedtime.            Discharge Care Instructions  (From admission, onward)         Start     Ordered   05/19/20 0000  Discharge wound care:       Comments: Keep clean and dry   05/19/20 1539         Allergies  Allergen Reactions  . Ampicillin   . Demerol [Meperidine] Other (See Comments)    Unknown reaction    Follow-up Information    Andres Shad, MD Follow up.   Specialty: Family Medicine Contact information: [address] [place]  [address] [phone]                The results of significant diagnostics from this hospitalization (including imaging, microbiology, ancillary and laboratory) are listed below for reference.    Significant Diagnostic Studies: No results found.  Microbiology: Recent Results (from the past 240 hour(s))  Resp Panel by RT-PCR (Flu A&B, Covid) Nasopharyngeal Swab     Status: None   Collection Time: 05/15/20 12:38 AM   Specimen: Nasopharyngeal Swab; Nasopharyngeal(NP) swabs in vial transport medium  Result Value Ref Range Status   SARS Coronavirus 2 by RT PCR NEGATIVE NEGATIVE Final    Comment: (NOTE) SARS-CoV-2 target nucleic acids are NOT DETECTED.  The SARS-CoV-2 RNA is generally detectable in upper respiratory specimens during the acute phase of infection. The lowest concentration of SARS-CoV-2 viral copies this assay can detect is 138 copies/mL. A negative result does not preclude SARS-Cov-2 infection and should not be used as the sole basis for treatment or other patient management decisions. A negative result may occur with  improper specimen collection/handling, submission of specimen other than nasopharyngeal swab, presence of viral mutation(s) within the areas targeted by this assay, and inadequate number of viral copies(<138 copies/mL). A negative result must be combined with clinical observations, patient history, and epidemiological information. The expected result is Negative.  Fact Sheet for Patients:  EntrepreneurPulse.com.au  Fact Sheet for Healthcare Providers:  IncredibleEmployment.be  This test is no t yet approved or cleared by the [place] FDA and  has been authorized for detection and/or diagnosis of SARS-CoV-2 by FDA under an Emergency Use Authorization (EUA). This EUA will remain  in effect (meaning this test can be used) for the duration of the COVID-19 declaration under Section 564(b)(1) of the Act,  21 U.S.C.section 360bbb-3(b)(1), unless the authorization is terminated  or revoked sooner.       Influenza A by PCR NEGATIVE NEGATIVE Final   Influenza B by PCR NEGATIVE NEGATIVE Final    Comment: (NOTE) The Xpert Xpress SARS-CoV-2/FLU/RSV plus assay is intended as an aid in the diagnosis of influenza from Nasopharyngeal swab specimens and should not be used as a sole basis for treatment. Nasal washings and aspirates are unacceptable for Xpert Xpress SARS-CoV-2/FLU/RSV testing.  Fact Sheet for Patients: EntrepreneurPulse.com.au  Fact Sheet for Healthcare Providers: IncredibleEmployment.be  This test is not yet approved or cleared by the [place] FDA and has been authorized for detection and/or diagnosis of SARS-CoV-2 by FDA under an Emergency Use Authorization (EUA). This EUA will remain in effect (meaning this test can be used) for the duration of the COVID-19 declaration under Section 564(b)(1) of the Act, 21 U.S.C. section 360bbb-3(b)(1), unless the authorization is terminated or revoked.  Performed at [facility], [place] [address] [place], [place] [address]   MRSA PCR Screening     Status: None   Collection Time: 05/16/20  4:29 AM   Specimen: Nasopharyngeal  Result Value Ref Range Status   MRSA by PCR NEGATIVE NEGATIVE Final    Comment:        The GeneXpert MRSA Assay (FDA approved for NASAL specimens only), is one component of a comprehensive MRSA colonization surveillance program. It is not intended to diagnose MRSA infection nor to guide or monitor treatment for MRSA infections. Performed at [facility], [place] [address] [place], [place] [address]      Labs: Basic Metabolic Panel: Recent Labs  Lab 05/15/20 0528 05/16/20 0347 05/17/20 0033 05/17/20 0410 05/18/20 0809 05/19/20 0043  NA 141 141 138  --  141 142  K 4.8 4.6 4.7  --  4.1 3.8  CL 115* 116* 111  --  111 114*  CO2 20* 20* 19*  --  23 22   GLUCOSE 75 73 68*  --  83 86  BUN 27* 24* 21  --  17 12  CREATININE 1.36* 1.27* 1.39*  --  1.61* 1.45*  CALCIUM 8.2* 7.9* 8.2*  --  8.4* 7.8*  MG  --   --   --  1.8  --   --    Liver Function Tests: Recent Labs  Lab 05/14/20 2107 05/15/20 0528 05/17/20 0033  AST 29 20 24   ALT 10 9 8   ALKPHOS 116 108 108  BILITOT 1.4* 1.2 1.1  PROT 5.3* 4.6* 4.9*  ALBUMIN 2.8* 2.5* 2.6*   No results for input(s): LIPASE, AMYLASE in the last 168 hours. No results for input(s): AMMONIA in the last 168 hours. CBC: Recent Labs  Lab 05/15/20 1528 05/15/20 2130 05/16/20 0347 05/16/20 1219 05/17/20 0033 05/18/20 0809 05/18/20 1308 05/18/20 2125 05/19/20 0043 05/20/20 0718  WBC 3.8* 3.5* 3.3*  --  4.9 4.5  --   --   --   --   HGB 7.9* 7.6* 6.7*   < > 10.1* 9.5* 8.6* 10.5* 10.0* 10.5*  HCT  25.9* 25.8* 22.4*   < > 32.4* 31.5* 27.8* 31.5* 29.8* 33.3*  MCV 97.7 102.0* 99.1  --  92.8 94.3  --   --   --   --   PLT 150 141* 142*  --  180 179  --   --   --   --    < > = values in this interval not displayed.   Cardiac Enzymes: No results for input(s): CKTOTAL, CKMB, CKMBINDEX, TROPONINI in the last 168 hours. BNP: BNP (last 3 results) No results for input(s): BNP in the last 8760 hours.  ProBNP (last 3 results) No results for input(s): PROBNP in the last 8760 hours.  CBG: No results for input(s): GLUCAP in the last 168 hours.     Signed:  Cristal Ford  [facility] 05/20/2020, 11:21 AM

## 2020-05-19 NOTE — Plan of Care (Signed)
  Problem: Health Behavior/Discharge Planning: Goal: Ability to manage health-related needs will improve Outcome: Progressing   Problem: Clinical Measurements: Goal: Ability to maintain clinical measurements within normal limits will improve Outcome: Progressing   Problem: Clinical Measurements: Goal: Diagnostic test results will improve Outcome: Progressing   Problem: Activity: Goal: Risk for activity intolerance will decrease Outcome: Progressing   Problem: Nutrition: Goal: Adequate nutrition will be maintained Outcome: Progressing   Problem: Pain Managment: Goal: General experience of comfort will improve Outcome: Progressing   Problem: Skin Integrity: Goal: Risk for impaired skin integrity will decrease Outcome: Progressing   Problem: Education: Goal: Ability to identify signs and symptoms of gastrointestinal bleeding will improve Outcome: Progressing   Problem: Bowel/Gastric: Goal: Will show no signs and symptoms of gastrointestinal bleeding Outcome: Progressing   Problem: Fluid Volume: Goal: Will show no signs and symptoms of excessive bleeding Outcome: Progressing   Problem: Clinical Measurements: Goal: Complications related to the disease process, condition or treatment will be avoided or minimized Outcome: Progressing

## 2020-05-19 NOTE — Progress Notes (Addendum)
Physical Therapy Treatment Patient Details Name: Allison Gardner MRN: [id number] DOB: 12-27-1922 Today's Date: 05/19/2020    History of Present Illness 87 year old female with past medical history of intracranial hemorrhage (2012), multiple ischemic strokes in the past, hypertension, peripheral vascular disease, systolic and diastolic congestive heart failure ( Echo 01/2020 EF 30-35%), cirrhosis (felt to be due to CHF), hyperlipidemia, gastroesophageal reflux disease, mild cognitive impairment, migraine headaches who presents ED 05/14/20 due to complaints of frequent dark bloody stools. 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 Pt with complaints of heel pain as blankets pulled back heels are found to be flat on the bed surface, slightly soft and painful on palpation. RN notified and pt and daughter educated on need to float heels especially with long periods in the bed. Pt BP continues to be low with slight dizziness with positional change. See values below. Pt also with decreased strength and endurance. Pt is supervision for bed mobility, min guard for transfers and min A for ambulation with RW. PT recommends to pt and her daughter that RW use would be safest when she discharges home. PT will continue to follow acutely.  Orthostatic BPs  Supine 102/56  Sitting 91/50  After ambulation  104/52  At rest in sitting  88/52      Follow Up Recommendations  Home health PT;Supervision/Assistance - 24 hour     Equipment Recommendations  None recommended by PT (has needed equipment)    Recommendations for Other Services OT consult     Precautions / Restrictions Precautions Precautions: Fall Precaution Comments: history of falling Restrictions Weight Bearing  Restrictions: No    Mobility  Bed Mobility Overal bed mobility: Needs Assistance Bed Mobility: Supine to Sit     Supine to sit: Supervision     General bed mobility comments: supervision for safety, increased time and effort to come to EoB  Transfers Overall transfer level: Needs assistance   Transfers: Sit to/from Stand Sit to Stand: Min guard         General transfer comment: min guard for safety, vc for hand placement for power up  Ambulation/Gait Ambulation/Gait assistance: Min assist Gait Distance (Feet): 20 Feet Assistive device: Rolling walker (2 wheeled) Gait Pattern/deviations: Step-through pattern;Decreased step length - right;Decreased step length - left;Trunk flexed Gait velocity: slowed Gait velocity interpretation: <1.31 ft/sec, indicative of household ambulator General Gait Details: minA for steadying for RW, ambulated from bed to door and back, pt with drop in BP with ambulation         Balance Overall balance assessment: Needs assistance Sitting-balance support: Feet supported;No upper extremity supported Sitting balance-Leahy Scale: Fair     Standing balance support: Single extremity supported;Bilateral upper extremity supported;During functional activity Standing balance-Leahy Scale: Poor Standing balance comment: requires at least single UE support                            Cognition Arousal/Alertness: Awake/alert Behavior During Therapy: WFL for tasks assessed/performed Overall Cognitive Status: Within Functional Limits for tasks assessed  General Comments: decreased safety awareness at baseline, does not follow family instructions      Exercises General Exercises - Lower Extremity Ankle Circles/Pumps: AROM;Both;10 reps;Seated Long Arc Quad: AROM;Both;10 reps;Seated Hip Flexion/Marching: AROM;Both;10 reps;Seated    General Comments General comments (skin integrity, edema,  etc.): Daughter Hassan Rowan in room during session      Pertinent Vitals/Pain Pain Assessment: Faces Faces Pain Scale: Hurts a little bit Pain Location: heels, stomach after eating Pain Descriptors / Indicators: Aching Pain Intervention(s): Limited activity within patient's tolerance;Monitored during session;Repositioned;Other (comment) (RN notified that heels need to be floated)    Home Living                      Prior Function            PT Goals (current goals can now be found in the care plan section) Acute Rehab PT Goals Patient Stated Goal: 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 PT Goal Formulation: With patient/family Time For Goal Achievement: 05/31/20 Potential to Achieve Goals: Fair Progress towards PT goals: Progressing toward goals    Frequency    Min 3X/week      PT Plan Current plan remains appropriate    Co-evaluation              AM-PAC PT "6 Clicks" Mobility   Outcome Measure  Help needed turning from your back to your side while in a flat bed without using bedrails?: None Help needed moving from lying on your back to sitting on the side of a flat bed without using bedrails?: None Help needed moving to and from a bed to a chair (including a wheelchair)?: A Little Help needed standing up from a chair using your arms (e.g., wheelchair or bedside chair)?: A Little Help needed to walk in hospital room?: A Little Help needed climbing 3-5 steps with a railing? : A Lot 6 Click Score: 19    End of Session Equipment Utilized During Treatment: Gait belt Activity Tolerance: Patient tolerated treatment well (continues to have low BP with slight dizziness) Patient left: in chair;with call bell/phone within reach;with family/visitor present Nurse Communication: Mobility status PT Visit Diagnosis: Unsteadiness on feet (R26.81);Other abnormalities of gait and mobility (R26.89);Muscle weakness (generalized) (M62.81);Difficulty in walking, not elsewhere classified  (R26.2);History of falling (Z91.81);Repeated falls (R29.6);Dizziness and giddiness (R42)     Time: 6754-4920 PT Time Calculation (min) (ACUTE ONLY): 24 min  Charges:  $Gait Training: 8-22 mins $Therapeutic Exercise: 8-22 mins                     Talibah Colasurdo B. Migdalia Dk PT, DPT Acute Rehabilitation Services Pager [phone] Office [phone]    [place] 05/19/2020, 2:00 PM

## 2020-05-20 DIAGNOSIS — D62 Acute posthemorrhagic anemia: Secondary | ICD-10-CM | POA: Diagnosis not present

## 2020-05-20 DIAGNOSIS — K5521 Angiodysplasia of colon with hemorrhage: Secondary | ICD-10-CM | POA: Diagnosis not present

## 2020-05-20 DIAGNOSIS — Z7901 Long term (current) use of anticoagulants: Secondary | ICD-10-CM | POA: Diagnosis not present

## 2020-05-20 DIAGNOSIS — K922 Gastrointestinal hemorrhage, unspecified: Secondary | ICD-10-CM | POA: Diagnosis not present

## 2020-05-20 LAB — HEMOGLOBIN AND HEMATOCRIT, BLOOD
HCT: 33.3 % — ABNORMAL LOW (ref 36.0–46.0)
Hemoglobin: 10.5 g/dL — ABNORMAL LOW (ref 12.0–15.0)

## 2020-05-20 MED ORDER — FUROSEMIDE 20 MG PO TABS: 20.0000 mg | ORAL_TABLET | Freq: Every day | ORAL | Status: DC | PRN

## 2020-05-20 NOTE — Discharge Instructions (Signed)
Anemia  Anemia is a condition in which you do not have enough red blood cells or hemoglobin. Hemoglobin is a substance in red blood cells that carries oxygen. When you do not have enough red blood cells or hemoglobin (are anemic), your body cannot get enough oxygen and your organs may not work properly. As a result, you may feel very tired or have other problems. What are the causes? Common causes of anemia include:  Excessive bleeding. Anemia can be caused by excessive bleeding inside or outside the body, including bleeding from the intestine or from periods in women.  Poor nutrition.  Long-lasting (chronic) kidney, thyroid, and liver disease.  Bone marrow disorders.  Cancer and treatments for cancer.  HIV (human immunodeficiency virus) and AIDS (acquired immunodeficiency syndrome).  Treatments for HIV and AIDS.  Spleen problems.  Blood disorders.  Infections, medicines, and autoimmune disorders that destroy red blood cells. What are the signs or symptoms? Symptoms of this condition include:  Minor weakness.  Dizziness.  Headache.  Feeling heartbeats that are irregular or faster than normal (palpitations).  Shortness of breath, especially with exercise.  Paleness.  Cold sensitivity.  Indigestion.  Nausea.  Difficulty sleeping.  Difficulty concentrating. Symptoms may occur suddenly or develop slowly. If your anemia is mild, you may not have symptoms. How is this diagnosed? This condition is diagnosed based on:  Blood tests.  Your medical history.  A physical exam.  Bone marrow biopsy. Your health care provider may also check your stool (feces) for blood and may do additional testing to look for the cause of your bleeding. You may also have other tests, including:  Imaging tests, such as a CT scan or MRI.  Endoscopy.  Colonoscopy. How is this treated? Treatment for this condition depends on the cause. If you continue to lose a lot of blood, you may  need to be treated at a hospital. Treatment may include:  Taking supplements of iron, vitamin M08, or folic acid.  Taking a hormone medicine (erythropoietin) that can help to stimulate red blood cell growth.  Having a blood transfusion. This may be needed if you lose a lot of blood.  Making changes to your diet.  Having surgery to remove your spleen. Follow these instructions at home:  Take over-the-counter and prescription medicines only as told by your health care provider.  Take supplements only as told by your health care provider.  Follow any diet instructions that you were given.  Keep all follow-up visits as told by your health care provider. This is important. Contact a health care provider if:  You develop new bleeding anywhere in the body. Get help right away if:  You are very weak.  You are short of breath.  You have pain in your abdomen or chest.  You are dizzy or feel faint.  You have trouble concentrating.  You have bloody or black, tarry stools.  You vomit repeatedly or you vomit up blood. Summary  Anemia is a condition in which you do not have enough red blood cells or enough of a substance in your red blood cells that carries oxygen (hemoglobin).  Symptoms may occur suddenly or develop slowly.  If your anemia is mild, you may not have symptoms.  This condition is diagnosed with blood tests as well as a medical history and physical exam. Other tests may be needed.  Treatment for this condition depends on the cause of the anemia. This information is not intended to replace advice given to you by  your health care provider. Make sure you discuss any questions you have with your health care provider. Document Revised: 04/11/2017 Document Reviewed: 05/31/2016 Elsevier Patient Education  [place].   Gastrointestinal Bleeding Gastrointestinal (GI) bleeding is bleeding somewhere along the digestive tract, between the mouth and the anus. This  tract includes the mouth, esophagus, stomach, small intestine, large intestine, and anus. The large intestine is often called the colon. GI bleeding can be caused by various problems. The severity of these problems can range from mild to serious or even life-threatening. If you have GI bleeding, you may find blood in your stools (feces), you may have black stools, or you may vomit blood. If there is a lot of bleeding, you may need to stay in the hospital. What are the causes? This condition may be caused by:  Inflammation, irritation, or swelling of the esophagus (esophagitis). The esophagus is part of the body that moves food from your mouth to your stomach.  Swollen veins in the rectum (hemorrhoids).  Areas of painful tearing in the anus that are often caused by passing hard stool (anal fissures).  Pouches that form on the colon over time, with age, and may bleed a lot (diverticulosis).  Inflammation (diverticulitis) in areas with diverticulosis. This can cause pain, fever, and bloody stools, although bleeding may be mild.  Growths (polyps) or cancer. Colon cancer often starts out as precancerous polyps.  Gastritis and ulcers. With these, bleeding may come from the upper GI tract, near the stomach. What increases the risk? You are more likely to develop this condition if you:  Have an infection in your stomach from a type of bacteria called Helicobacter pylori.  Take certain medicines, such as: ? NSAIDs. ? Aspirin. ? Selective serotonin reuptake inhibitors (SSRIs). ? Steroids. ? Antiplatelet or anticoagulant medicines.  Smoke.  Drink alcohol. What are the signs or symptoms? Common symptoms of this condition include:  Bright red blood in your vomit, or vomit that looks like coffee grounds.  Bloody, black, or tarry stools. ? Bleeding from the lower GI tract will usually cause red or maroon blood in the stools. ? Bleeding from the upper GI tract may cause black, tarry stools  that are often stronger smelling than usual. ? In certain cases, if the bleeding is fast enough, the stools may be red.  Pain or cramping in the abdomen. How is this diagnosed? This condition may be diagnosed based on:  Your medical history and a physical exam.  Various tests, such as: ? Blood tests. ? Stool tests. ? X-rays and other imaging tests. ? Esophagogastroduodenoscopy (EGD). In this test, a flexible, lighted tube is used to look at your esophagus, stomach, and small intestine. ? Colonoscopy. In this test, a flexible, lighted tube is used to look at your colon. How is this treated? Treatment for this condition depends on the cause of the bleeding. For example:  For bleeding from the esophagus, stomach, small intestine, or colon, the health care provider doing your EGD or colonoscopy may be able to stop the bleeding as part of the procedure.  Inflammation or infection of the colon can be treated with medicines.  Certain rectal problems can be treated with creams, suppositories, or warm baths.  Medicines may be given to reduce acid in your stomach.  Surgery is sometimes needed.  Blood transfusions are sometimes needed if a lot of blood has been lost. If bleeding is mild, you may be allowed to go home. If there is a lot  of bleeding, you will need to stay in the hospital for observation. Follow these instructions at home:   Take over-the-counter and prescription medicines only as told by your health care provider.  Eat foods that are high in fiber, such as beans, whole grains, and fresh fruits and vegetables. This will help to keep your stools soft. Eating 1-3 prunes each day works well for many people.  Drink enough fluid to keep your urine pale yellow.  Keep all follow-up visits as told by your health care provider. This is important. Contact a health care provider if:  Your symptoms do not improve. Get help right away if:  Your bleeding does not stop.  You feel  light-headed or you faint.  You feel weak.  You have severe cramps in your back or abdomen.  You pass large blood clots in your stool.  Your symptoms are getting worse.  You have chest pain or fast heartbeats. Summary  Gastrointestinal (GI) bleeding is bleeding somewhere along the digestive tract, between the mouth and anus. GI bleeding can be caused by various problems. The severity of these problems can range from mild to serious or even life-threatening.  Treatment for this condition depends on the cause of the bleeding.  Take over-the-counter and prescription medicines only as told by your health care provider.  Keep all follow-up visits as told by your health care provider. This is important.  Get help right away if your bleeding increases, your symptoms are getting worse, or you have new symptoms. This information is not intended to replace advice given to you by your health care provider. Make sure you discuss any questions you have with your health care provider. Document Revised: 12/10/2017 Document Reviewed: 12/10/2017 Elsevier Patient Education  [place].

## 2020-05-20 NOTE — Progress Notes (Signed)
PROGRESS NOTE    Allison Gardner  ACZ:[id number] DOB: [birth year] DOA: 05/14/2020 PCP: Andres Shad, MD   Brief Narrative:  HPI on 05/15/2020 by Dr. Inda Merlin Allison Gardner with past medical history of intracranial hemorrhage (2012), multiple ischemic strokes in the past, hypertension, peripheral vascular disease, systolic and diastolic congestive heart failure ( Echo 01/2020 EF 30-35%), cirrhosis (felt to be due to CHF), hyperlipidemia, gastroesophageal reflux disease, mild cognitive impairment, migraine headaches who presents to [facility] emergency department due to complaints of frequent dark bloody stools.  Patient is an extremely poor historian due to known history of mild cognitive impairment and therefore the majority the history has been obtained from the daughter who is at the bedside in addition to review of emergency department records from [facility] in [place].  Patient resides in [place] with one of her daughters.  The remaining 7 of her children live elsewhere with several of them living in [facility].  According to the daughter, approximately 8 days ago patient began to exhibit frequent bouts of dark bloody stools.  These black bloody stools have been occurring essentially daily since onset.  Symptoms continue to worsen in the days that followed and became associated with generalized weakness, fatigue and dyspnea on exertion.  Patient also experienced occasional episodes of chest discomfort although further details could not be obtained.  The symptoms continue to worsen until the patient eventually presented to the [facility] emergency department on 12/30.  Upon arrival, patient was noted to be hypotensive with initial hemoglobin of 5.3.  Patient was managed with intravenous Protonix, intravenous octreotide infusion, and 3 units of packed red blood cell transfusion.  Patient clinically stabilized with systolic blood  pressures remaining essentially in the 90s throughout the emergency department stay.    Emergency department medical records state that attempts were made to transfer the patient to another healthcare facility with gastroenterology coverage.  Unfortunately, after waiting for nearly 4 days no availability could be found in the surrounding hospitals.  Emergency department records note that the patient and family then left the emergency department [place].  This account differs from what the daughter tells me.  She states that they were discharged from the emergency department and were told to find care elsewhere.  She states that they requested transfer to another facility but were told by emergency department providers that it was "illegal" for her mother to be transferred to another facility.   Upon arrival to [facility] emergency department, patient is continuing to experience bouts of dark bloody stools.  Patient is complaining of vague left lower quadrant pain.  Hemoglobin upon arrival to the emergency department is 9.7.  The hospitalist group is now been called to assess the patient for mission the hospital.  Interim history Patient admitted with GI bleed, gastroenterology consulted, status post colonoscopy today. Assessment & Plan   Acute blood loss anemia/GI bleed/ Symptomatic anemia -Patient reported approximately 1 day history of intermittent bouts of dark bloody stools -Was placed on IV Protonix every 12 hours -Patient was given 3 units of blood in outside facility as well as 1 unit of PRBCs on 05/16/2020  -Hemoglobin currently 9.5 -Gastroenterology consulted and appreciated status post Colonoscopy showed blood in the entire colon.  Single actively bleeding colonic angiectasia, treated with argon plasma coagulation and 1 clip placed.  Diverticulosis in the sigmoid colon.  Recommendations to avoid NSAIDs.  Would hold Xarelto for additional 2 days and discussed  concerning  long-term risk versus benefit of Xarelto with prescribing provider as appropriate.  If hemoglobin is stable patient could possibly discharge on 05/18/2020. -Given drop in hemoglobin as well as continued symptoms, 1 unit PRBC was ordered for transfusion -Hemoglobin today 10  -Continue to monitor H&H  Hyperlipidemia -Continue statin  Chronic combined systolic and diastolic heart failure -Currently appears to be euvolemic and compensated -Last EF was 30 to 47% with complications of congestive cirrhosis -Continue to monitor intake and output, daily weights  Chronic anticoagulation -Per daughter at bedside, patient was placed on this medication while hospitalized in September, suspected to be due to multiple CVAs.  When asked about atrial fibrillation or irregular heartbeat, both patient and daughter were unaware of such a diagnosis. -Question whether Xarelto is truly needed at this point but feel that patient should discuss this with her prescribing physician. -Patient is no longer on Plavix and it appears that aspirin has been discontinued as well  Thrombocytopenia -Presumably from recent diagnosis of cirrhosis felt to be secondary to advanced CHF -Stable  Chronic kidney disease, stage IIIb -Mild bump in creatinine today, currently 1.45.  Continue to monitor BMP closely  GERD -Continue PPI  Hyperkalemia -Resolved   DVT Prophylaxis  SCDs  Code Status: Full  Family Communication: Daughter at bedside  Disposition Plan:  Status is: Inpatient  Remains inpatient appropriate because:Hemodynamically unstable and monitor H/H   Dispo: The patient is from: Home              Anticipated d/c is to: Home              Anticipated d/c date is: 1 day              Patient currently is not medically stable to d/c.  Consultants Gastroenterology  Procedures  Colonoscopy  Antibiotics   Anti-infectives (From admission, onward)   None      Subjective:   [name]  seen and examined today.  Hoping to go home.  Feels slightly dizzy.  Denies headache at this time.  Denies chest pain or shortness of breath, abdominal pain, nausea or vomiting.  Objective:   Vitals:   05/19/20 2000 05/20/20 0000 05/20/20 0300 05/20/20 0700  BP: 112/60 (!) 101/56 121/66 (!) 100/58  Pulse: 95 82 (!) 107 77  Resp: 19 18 20 19   Temp: 98.9 F (37.2 C) 98.7 F (37.1 C) 98.6 F (37 C) 98.3 F (36.8 C)  TempSrc: Oral Oral Oral Oral  SpO2: 99% 99% 99% 98%  Weight:      Height:        Intake/Output Summary (Last 24 hours) at 05/20/2020 1115 Last data filed at 05/20/2020 0800 Gross per 24 hour  Intake 300 ml  Output 550 ml  Net -250 ml   Filed Weights   05/17/20 0637 05/17/20 0752 05/18/20 0600  Weight: 48 kg 48 kg 47.8 kg   Exam  General: Well developed, thin, chronically ill-appearing, NAD  HEENT: NCAT, mucous membranes moist.   Cardiovascular: S1 S2 auscultated, RRR.  Respiratory: Clear to auscultation bilaterally, no wheezing  Abdomen: Soft, nontender, nondistended, + bowel sounds  Extremities: warm dry without cyanosis clubbing or edema  Neuro: AAOx3, nonfocal  Psych: appropriate mood and affect   Data Reviewed: I have personally reviewed following labs and imaging studies  CBC: Recent Labs  Lab 05/15/20 1528 05/15/20 2130 05/16/20 0347 05/16/20 1219 05/17/20 0033 05/18/20 0809 05/18/20 1308 05/18/20 2125 05/19/20 0043 05/20/20 0718  WBC 3.8* 3.5* 3.3*  --  4.9 4.5  --   --   --   --   HGB 7.9* 7.6* 6.7*   < > 10.1* 9.5* 8.6* 10.5* 10.0* 10.5*  HCT 25.9* 25.8* 22.4*   < > 32.4* 31.5* 27.8* 31.5* 29.8* 33.3*  MCV 97.7 102.0* 99.1  --  92.8 94.3  --   --   --   --   PLT 150 141* 142*  --  180 179  --   --   --   --    < > = values in this interval not displayed.   Basic Metabolic Panel: Recent Labs  Lab 05/15/20 0528 05/16/20 0347 05/17/20 0033 05/17/20 0410 05/18/20 0809 05/19/20 0043  NA 141 141 138  --  141 142  K 4.8 4.6 4.7   --  4.1 3.8  CL 115* 116* 111  --  111 114*  CO2 20* 20* 19*  --  23 22  GLUCOSE 75 73 68*  --  83 86  BUN 27* 24* 21  --  17 12  CREATININE 1.36* 1.27* 1.39*  --  1.61* 1.45*  CALCIUM 8.2* 7.9* 8.2*  --  8.4* 7.8*  MG  --   --   --  1.8  --   --    GFR: Estimated Creatinine Clearance: 16.7 mL/min (A) (by C-G formula based on SCr of 1.45 mg/dL (H)). Liver Function Tests: Recent Labs  Lab 05/14/20 2107 05/15/20 0528 05/17/20 0033  AST 29 20 24   ALT 10 9 8   ALKPHOS 116 108 108  BILITOT 1.4* 1.2 1.1  PROT 5.3* 4.6* 4.9*  ALBUMIN 2.8* 2.5* 2.6*   No results for input(s): LIPASE, AMYLASE in the last 168 hours. No results for input(s): AMMONIA in the last 168 hours. Coagulation Profile: Recent Labs  Lab 05/15/20 0528 05/17/20 0410  INR 1.3* 1.3*   Cardiac Enzymes: No results for input(s): CKTOTAL, CKMB, CKMBINDEX, TROPONINI in the last 168 hours. BNP (last 3 results) No results for input(s): PROBNP in the last 8760 hours. HbA1C: No results for input(s): HGBA1C in the last 72 hours. CBG: No results for input(s): GLUCAP in the last 168 hours. Lipid Profile: No results for input(s): CHOL, HDL, LDLCALC, TRIG, CHOLHDL, LDLDIRECT in the last 72 hours. Thyroid Function Tests: No results for input(s): TSH, T4TOTAL, FREET4, T3FREE, THYROIDAB in the last 72 hours. Anemia Panel: No results for input(s): VITAMINB12, FOLATE, FERRITIN, TIBC, IRON, RETICCTPCT in the last 72 hours. Urine analysis:    Component Value Date/Time   COLORURINE YELLOW 12/22/2008 0404   APPEARANCEUR CLEAR 12/22/2008 0404   LABSPEC 1.018 12/22/2008 0404   PHURINE 5.0 12/22/2008 0404   GLUCOSEU NEGATIVE 12/22/2008 0404   HGBUR NEGATIVE 12/22/2008 0404   BILIRUBINUR NEGATIVE 12/22/2008 0404   KETONESUR NEGATIVE 12/22/2008 0404   PROTEINUR NEGATIVE 12/22/2008 0404   UROBILINOGEN 0.2 12/22/2008 0404   NITRITE NEGATIVE 12/22/2008 0404   LEUKOCYTESUR NEGATIVE 12/22/2008 0404   Sepsis  Labs: @LABRCNTIP (procalcitonin:4,lacticidven:4)  ) Recent Results (from the past 240 hour(s))  Resp Panel by RT-PCR (Flu A&B, Covid) Nasopharyngeal Swab     Status: None   Collection Time: 05/15/20 12:38 AM   Specimen: Nasopharyngeal Swab; Nasopharyngeal(NP) swabs in vial transport medium  Result Value Ref Range Status   SARS Coronavirus 2 by RT PCR NEGATIVE NEGATIVE Final    Comment: (NOTE) SARS-CoV-2 target nucleic acids are NOT DETECTED.  The SARS-CoV-2 RNA is generally detectable in upper respiratory specimens during the acute phase of infection. The lowest concentration  of SARS-CoV-2 viral copies this assay can detect is 138 copies/mL. A negative result does not preclude SARS-Cov-2 infection and should not be used as the sole basis for treatment or other patient management decisions. A negative result may occur with  improper specimen collection/handling, submission of specimen other than nasopharyngeal swab, presence of viral mutation(s) within the areas targeted by this assay, and inadequate number of viral copies(<138 copies/mL). A negative result must be combined with clinical observations, patient history, and epidemiological information. The expected result is Negative.  Fact Sheet for Patients:  EntrepreneurPulse.com.au  Fact Sheet for Healthcare Providers:  IncredibleEmployment.be  This test is no t yet approved or cleared by the [place] FDA and  has been authorized for detection and/or diagnosis of SARS-CoV-2 by FDA under an Emergency Use Authorization (EUA). This EUA will remain  in effect (meaning this test can be used) for the duration of the COVID-19 declaration under Section 564(b)(1) of the Act, 21 U.S.C.section 360bbb-3(b)(1), unless the authorization is terminated  or revoked sooner.       Influenza A by PCR NEGATIVE NEGATIVE Final   Influenza B by PCR NEGATIVE NEGATIVE Final    Comment: (NOTE) The Xpert  Xpress SARS-CoV-2/FLU/RSV plus assay is intended as an aid in the diagnosis of influenza from Nasopharyngeal swab specimens and should not be used as a sole basis for treatment. Nasal washings and aspirates are unacceptable for Xpert Xpress SARS-CoV-2/FLU/RSV testing.  Fact Sheet for Patients: EntrepreneurPulse.com.au  Fact Sheet for Healthcare Providers: IncredibleEmployment.be  This test is not yet approved or cleared by the [place] FDA and has been authorized for detection and/or diagnosis of SARS-CoV-2 by FDA under an Emergency Use Authorization (EUA). This EUA will remain in effect (meaning this test can be used) for the duration of the COVID-19 declaration under Section 564(b)(1) of the Act, 21 U.S.C. section 360bbb-3(b)(1), unless the authorization is terminated or revoked.  Performed at [facility], [place] [address], [place], [place] [address]   MRSA PCR Screening     Status: None   Collection Time: 05/16/20  4:29 AM   Specimen: Nasopharyngeal  Result Value Ref Range Status   MRSA by PCR NEGATIVE NEGATIVE Final    Comment:        The GeneXpert MRSA Assay (FDA approved for NASAL specimens only), is one component of a comprehensive MRSA colonization surveillance program. It is not intended to diagnose MRSA infection nor to guide or monitor treatment for MRSA infections. Performed at [facility], [place] [address] [place], [place] [address]       Radiology Studies: No results found.   Scheduled Meds: . furosemide  20 mg Oral Daily  . levothyroxine  25 mcg Oral Daily  . pantoprazole  40 mg Oral QAC breakfast  . rosuvastatin  5 mg Oral QHS   Continuous Infusions:   LOS: 5 days   Time Spent in minutes   45 minutes  Mekhi Lascola D.O. on 05/20/2020 at 11:15 AM  Between 7am to 7pm - Please see pager noted on [email or web address]  After 7pm go to [email or web address]  And look for the night coverage person covering for  me after hours  [facility] Office  [phone]

## 2020-05-20 NOTE — Progress Notes (Signed)
Occupational Therapy Treatment Patient Details Name: Allison Gardner MRN: [id number] DOB: 1922/05/18 Today's Date: 05/20/2020    History of present illness 87 year old female with past medical history of intracranial hemorrhage (2012), multiple ischemic strokes in the past, hypertension, peripheral vascular disease, systolic and diastolic congestive heart failure ( Echo 01/2020 EF 30-35%), cirrhosis (felt to be due to CHF), hyperlipidemia, gastroesophageal reflux disease, mild cognitive impairment, migraine headaches who presents ED 05/14/20 due to complaints of frequent dark bloody stools. Admitted for lower GI bleed, and fiven 1 unit PRBC s/p 1/5 colonoscopy where a single actively bleeding colonic andioectasia was clipped   OT comments  Patient feeling much better, hoping to go home today.  Her daughter lives next door and is generally with her throughout the day until 10pm.  The patient is up and moving well, she was able to participate with all functional tasks, needing only setup and occasional Min Guard.  HH has been recommended if she is agreeable.  OT will continue to follow in the acute setting.     Follow Up Recommendations  Supervision/Assistance - 24 hour;Home health OT    Equipment Recommendations  3 in 1 bedside commode    Recommendations for Other Services      Precautions / Restrictions Precautions Precautions: Fall Restrictions Weight Bearing Restrictions: No       Mobility Bed Mobility Overal bed mobility: Needs Assistance Bed Mobility: Supine to Sit     Supine to sit: Supervision        Transfers Overall transfer level: Needs assistance   Transfers: Sit to/from Stand Sit to Stand: Supervision              Balance   Sitting-balance support: Feet supported;No upper extremity supported Sitting balance-Leahy Scale: Good     Standing balance support: Bilateral upper extremity supported Standing balance-Leahy Scale: Poor Standing balance comment:  RW this am                           ADL either performed or assessed with clinical judgement   ADL       Grooming: Wash/dry hands;Wash/dry face;Oral care;Sitting;Standing;Supervision/safety   Upper Body Bathing: Sitting;Supervision/ safety   Lower Body Bathing: Sit to/from stand;Min guard   Upper Body Dressing : Supervision/safety;Sitting   Lower Body Dressing: Min guard;Sit to/from stand                       Vision Baseline Vision/History: Wears glasses Wears Glasses: At all times Patient Visual Report: No change from baseline     Perception     Praxis      Cognition Arousal/Alertness: Awake/alert Behavior During Therapy: WFL for tasks assessed/performed Overall Cognitive Status: Within Functional Limits for tasks assessed                                                      General Comments  VSS on RA    Pertinent Vitals/ Pain       Pain Assessment: No/denies pain  Home Living                                          Prior Functioning/Environment  Frequency  Min 2X/week        Progress Toward Goals  OT Goals(current goals can now be found in the care plan section)  Progress towards OT goals: Progressing toward goals  Acute Rehab OT Goals Patient Stated Goal: get back home OT Goal Formulation: With patient Time For Goal Achievement: 06/01/20 Potential to Achieve Goals: Good  Plan      Co-evaluation                 AM-PAC OT "6 Clicks" Daily Activity     Outcome Measure   Help from another person eating meals?: None Help from another person taking care of personal grooming?: A Little Help from another person toileting, which includes using toliet, bedpan, or urinal?: A Little Help from another person bathing (including washing, rinsing, drying)?: A Little Help from another person to put on and taking off regular upper body clothing?: A Little Help from  another person to put on and taking off regular lower body clothing?: A Little 6 Click Score: 19    End of Session Equipment Utilized During Treatment: Rolling walker  OT Visit Diagnosis: Unsteadiness on feet (R26.81);Other abnormalities of gait and mobility (R26.89);Muscle weakness (generalized) (M62.81);History of falling (Z91.81);Pain   Activity Tolerance     Patient Left in chair;with call bell/phone within reach   Nurse Communication          Time: [phone] OT Time Calculation (min): 21 min  Charges: OT General Charges $OT Visit: 1 Visit OT Treatments $Self Care/Home Management : 8-22 mins  05/20/2020  Rich, OTR/L  Acute Rehabilitation Services  Office:  [place] 05/20/2020, 9:30 AM

## 2020-05-20 NOTE — TOC Progression Note (Addendum)
Transition of Care [facility]) - Progression Note    Patient Details  Name: TAMME MOZINGO MRN: [id number] Date of Birth: [birth year]  Transition of Care [facility]) CM/SW [place], RN Phone Number: 05/20/2020, 9:12 AM  Clinical Narrative:    TOC following up to set up home health. CM has called [facility]. Message left with answering service. Awaiting return call. CM spoke with daughter to verify [facility] agency. Daughter states that she will call CM back because she needs to look at some notes. Will await return call.   1056 no previous response. Second attempt to call [facility]. Message has been left with the answering service. Will await return call.  1110 CM received return call from [place] Chrissie Noa) Per Chrissie Noa there is no one available to complete referral on the weekends. Order and demographics should be faxed to the office at [phone]. Info faxed for referral and the office will contact patient and family on Monday. TOC will sign off.      Expected Discharge Plan and Services           Expected Discharge Date: 05/19/20                                     Social Determinants of Health (SDOH) Interventions    Readmission Risk Interventions No flowsheet data found.

## 2020-05-20 NOTE — Progress Notes (Signed)
Left voice message to Pt's daughter regarding d/c home today. She didn't call back, left voice message to pt's son. Called pt's daughter again and finally connected to her. She is on her way now. HS [employer]

## 2020-05-20 NOTE — Progress Notes (Signed)
Removed PIV access x 1 and patient's daughter received discharge instructions and understood it well. She took patient's all belongings. HS [employer]

## 2020-07-05 ENCOUNTER — Emergency Department (HOSPITAL_COMMUNITY): Payer: Medicare HMO

## 2020-07-05 ENCOUNTER — Observation Stay (HOSPITAL_COMMUNITY): Payer: Medicare HMO

## 2020-07-05 ENCOUNTER — Encounter (HOSPITAL_COMMUNITY): Payer: Self-pay | Admitting: Emergency Medicine

## 2020-07-05 ENCOUNTER — Inpatient Hospital Stay (HOSPITAL_COMMUNITY)
Admission: EM | Admit: 2020-07-05 | Discharge: 2020-07-13 | DRG: 291 | Disposition: A | Discharge status: Home Health | Payer: Medicare HMO | Attending: Family Medicine | Admitting: Family Medicine

## 2020-07-05 DIAGNOSIS — Z6820 Body mass index (BMI) 20.0-20.9, adult: Secondary | ICD-10-CM

## 2020-07-05 DIAGNOSIS — I130 Hypertensive heart and chronic kidney disease with heart failure and stage 1 through stage 4 chronic kidney disease, or unspecified chronic kidney disease: Secondary | ICD-10-CM | POA: Diagnosis not present

## 2020-07-05 DIAGNOSIS — M21372 Foot drop, left foot: Secondary | ICD-10-CM | POA: Diagnosis present

## 2020-07-05 DIAGNOSIS — R419 Unspecified symptoms and signs involving cognitive functions and awareness: Secondary | ICD-10-CM

## 2020-07-05 DIAGNOSIS — N1832 Chronic kidney disease, stage 3b: Secondary | ICD-10-CM | POA: Diagnosis present

## 2020-07-05 DIAGNOSIS — R519 Headache, unspecified: Secondary | ICD-10-CM | POA: Diagnosis present

## 2020-07-05 DIAGNOSIS — M5030 Other cervical disc degeneration, unspecified cervical region: Secondary | ICD-10-CM

## 2020-07-05 DIAGNOSIS — M4722 Other spondylosis with radiculopathy, cervical region: Secondary | ICD-10-CM | POA: Diagnosis present

## 2020-07-05 DIAGNOSIS — F0390 Unspecified dementia without behavioral disturbance: Secondary | ICD-10-CM | POA: Diagnosis present

## 2020-07-05 DIAGNOSIS — M47812 Spondylosis without myelopathy or radiculopathy, cervical region: Secondary | ICD-10-CM | POA: Diagnosis present

## 2020-07-05 DIAGNOSIS — K5520 Angiodysplasia of colon without hemorrhage: Secondary | ICD-10-CM | POA: Diagnosis present

## 2020-07-05 DIAGNOSIS — M25559 Pain in unspecified hip: Secondary | ICD-10-CM

## 2020-07-05 DIAGNOSIS — I700 Atherosclerosis of aorta: Secondary | ICD-10-CM | POA: Diagnosis present

## 2020-07-05 DIAGNOSIS — G3184 Mild cognitive impairment, so stated: Secondary | ICD-10-CM | POA: Diagnosis present

## 2020-07-05 DIAGNOSIS — K219 Gastro-esophageal reflux disease without esophagitis: Secondary | ICD-10-CM | POA: Diagnosis present

## 2020-07-05 DIAGNOSIS — R6889 Other general symptoms and signs: Secondary | ICD-10-CM

## 2020-07-05 DIAGNOSIS — I5023 Acute on chronic systolic (congestive) heart failure: Secondary | ICD-10-CM | POA: Diagnosis not present

## 2020-07-05 DIAGNOSIS — K7460 Unspecified cirrhosis of liver: Secondary | ICD-10-CM | POA: Diagnosis present

## 2020-07-05 DIAGNOSIS — E782 Mixed hyperlipidemia: Secondary | ICD-10-CM | POA: Diagnosis present

## 2020-07-05 DIAGNOSIS — Z9189 Other specified personal risk factors, not elsewhere classified: Secondary | ICD-10-CM

## 2020-07-05 DIAGNOSIS — N184 Chronic kidney disease, stage 4 (severe): Secondary | ICD-10-CM | POA: Diagnosis present

## 2020-07-05 DIAGNOSIS — R413 Other amnesia: Secondary | ICD-10-CM | POA: Diagnosis present

## 2020-07-05 DIAGNOSIS — M160 Bilateral primary osteoarthritis of hip: Secondary | ICD-10-CM | POA: Diagnosis present

## 2020-07-05 DIAGNOSIS — Z888 Allergy status to other drugs, medicaments and biological substances status: Secondary | ICD-10-CM

## 2020-07-05 DIAGNOSIS — Z79899 Other long term (current) drug therapy: Secondary | ICD-10-CM

## 2020-07-05 DIAGNOSIS — T501X5A Adverse effect of loop [high-ceiling] diuretics, initial encounter: Secondary | ICD-10-CM | POA: Diagnosis present

## 2020-07-05 DIAGNOSIS — Z7901 Long term (current) use of anticoagulants: Secondary | ICD-10-CM

## 2020-07-05 DIAGNOSIS — M542 Cervicalgia: Secondary | ICD-10-CM | POA: Diagnosis present

## 2020-07-05 DIAGNOSIS — I509 Heart failure, unspecified: Secondary | ICD-10-CM | POA: Diagnosis not present

## 2020-07-05 DIAGNOSIS — R000 Tachycardia, unspecified: Secondary | ICD-10-CM | POA: Diagnosis present

## 2020-07-05 DIAGNOSIS — K761 Chronic passive congestion of liver: Secondary | ICD-10-CM | POA: Diagnosis present

## 2020-07-05 DIAGNOSIS — R2681 Unsteadiness on feet: Secondary | ICD-10-CM | POA: Diagnosis present

## 2020-07-05 DIAGNOSIS — I447 Left bundle-branch block, unspecified: Secondary | ICD-10-CM | POA: Diagnosis present

## 2020-07-05 DIAGNOSIS — D696 Thrombocytopenia, unspecified: Secondary | ICD-10-CM | POA: Diagnosis present

## 2020-07-05 DIAGNOSIS — E8809 Other disorders of plasma-protein metabolism, not elsewhere classified: Secondary | ICD-10-CM | POA: Diagnosis present

## 2020-07-05 DIAGNOSIS — Z7409 Other reduced mobility: Secondary | ICD-10-CM | POA: Diagnosis present

## 2020-07-05 DIAGNOSIS — M6281 Muscle weakness (generalized): Secondary | ICD-10-CM

## 2020-07-05 DIAGNOSIS — E039 Hypothyroidism, unspecified: Secondary | ICD-10-CM | POA: Diagnosis present

## 2020-07-05 DIAGNOSIS — I420 Dilated cardiomyopathy: Secondary | ICD-10-CM | POA: Diagnosis present

## 2020-07-05 DIAGNOSIS — I5022 Chronic systolic (congestive) heart failure: Secondary | ICD-10-CM | POA: Diagnosis present

## 2020-07-05 DIAGNOSIS — Z20822 Contact with and (suspected) exposure to covid-19: Secondary | ICD-10-CM | POA: Diagnosis present

## 2020-07-05 DIAGNOSIS — M5011 Cervical disc disorder with radiculopathy,  high cervical region: Secondary | ICD-10-CM | POA: Diagnosis present

## 2020-07-05 DIAGNOSIS — Z8673 Personal history of transient ischemic attack (TIA), and cerebral infarction without residual deficits: Secondary | ICD-10-CM

## 2020-07-05 DIAGNOSIS — I5043 Acute on chronic combined systolic (congestive) and diastolic (congestive) heart failure: Secondary | ICD-10-CM | POA: Diagnosis present

## 2020-07-05 DIAGNOSIS — Z79890 Hormone replacement therapy (postmenopausal): Secondary | ICD-10-CM

## 2020-07-05 DIAGNOSIS — R54 Age-related physical debility: Secondary | ICD-10-CM

## 2020-07-05 DIAGNOSIS — Z880 Allergy status to penicillin: Secondary | ICD-10-CM

## 2020-07-05 DIAGNOSIS — N179 Acute kidney failure, unspecified: Secondary | ICD-10-CM | POA: Diagnosis present

## 2020-07-05 DIAGNOSIS — E440 Moderate protein-calorie malnutrition: Secondary | ICD-10-CM | POA: Insufficient documentation

## 2020-07-05 DIAGNOSIS — R52 Pain, unspecified: Secondary | ICD-10-CM

## 2020-07-05 HISTORY — DX: Abnormal findings on diagnostic imaging of other parts of digestive tract: R93.3

## 2020-07-05 HISTORY — DX: Heart failure, unspecified: I50.9

## 2020-07-05 HISTORY — DX: Angiodysplasia of colon with hemorrhage: K55.21

## 2020-07-05 LAB — CBC
HCT: 32.9 % — ABNORMAL LOW (ref 36.0–46.0)
Hemoglobin: 10.5 g/dL — ABNORMAL LOW (ref 12.0–15.0)
MCH: 30.5 pg (ref 26.0–34.0)
MCHC: 31.9 g/dL (ref 30.0–36.0)
MCV: 95.6 fL (ref 80.0–100.0)
Platelets: 173 10*3/uL (ref 150–400)
RBC: 3.44 MIL/uL — ABNORMAL LOW (ref 3.87–5.11)
RDW: 18.3 % — ABNORMAL HIGH (ref 11.5–15.5)
WBC: 4.7 10*3/uL (ref 4.0–10.5)
nRBC: 0 % (ref 0.0–0.2)

## 2020-07-05 LAB — SARS CORONAVIRUS 2 (TAT 6-24 HRS): SARS Coronavirus 2: NEGATIVE

## 2020-07-05 LAB — MAGNESIUM: Magnesium: 2.2 mg/dL (ref 1.7–2.4)

## 2020-07-05 LAB — TROPONIN I (HIGH SENSITIVITY)
Troponin I (High Sensitivity): 111 ng/L (ref <18)
Troponin I (High Sensitivity): 122 ng/L (ref <18)

## 2020-07-05 LAB — BASIC METABOLIC PANEL
Anion gap: 10 (ref 5–15)
BUN: 29 mg/dL — ABNORMAL HIGH (ref 8–23)
CO2: 25 mmol/L (ref 22–32)
Calcium: 9.1 mg/dL (ref 8.9–10.3)
Chloride: 112 mmol/L — ABNORMAL HIGH (ref 98–111)
Creatinine, Ser: 2.03 mg/dL — ABNORMAL HIGH (ref 0.44–1.00)
GFR, Estimated: 22 mL/min — ABNORMAL LOW (ref >60)
Glucose, Bld: 88 mg/dL (ref 70–99)
Potassium: 4.5 mmol/L (ref 3.5–5.1)
Sodium: 147 mmol/L — ABNORMAL HIGH (ref 135–145)

## 2020-07-05 LAB — TSH: TSH: 2.997 u[IU]/mL (ref 0.350–4.500)

## 2020-07-05 LAB — BRAIN NATRIURETIC PEPTIDE: B Natriuretic Peptide: >4500 pg/mL — ABNORMAL HIGH (ref 0.0–100.0)

## 2020-07-05 MED ORDER — FUROSEMIDE 10 MG/ML IJ SOLN
20.0000 mg | Freq: Two times a day (BID) | INTRAMUSCULAR | Status: DC
  Administered 2020-07-06: 20 mg via INTRAVENOUS
  Filled 2020-07-05 (×2): qty 2

## 2020-07-05 MED ORDER — LEVOTHYROXINE SODIUM 25 MCG PO TABS
25.0000 ug | ORAL_TABLET | Freq: Every day | ORAL | Status: DC
  Administered 2020-07-06 – 2020-07-13 (×8): 25 ug via ORAL
  Filled 2020-07-05 (×8): qty 1

## 2020-07-05 MED ORDER — ONDANSETRON HCL 4 MG/2ML IJ SOLN: 4.0000 mg | Freq: Four times a day (QID) | INTRAMUSCULAR | Status: DC | PRN

## 2020-07-05 MED ORDER — MORPHINE SULFATE (PF) 2 MG/ML IV SOLN
2.0000 mg | Freq: Once | INTRAVENOUS | Status: AC
  Administered 2020-07-05: 2 mg via INTRAVENOUS
  Filled 2020-07-05: qty 1

## 2020-07-05 MED ORDER — FUROSEMIDE 10 MG/ML IJ SOLN
20.0000 mg | Freq: Once | INTRAMUSCULAR | Status: AC
  Administered 2020-07-05: 20 mg via INTRAVENOUS
  Filled 2020-07-05: qty 2

## 2020-07-05 MED ORDER — ROSUVASTATIN CALCIUM 5 MG PO TABS
5.0000 mg | ORAL_TABLET | Freq: Every day | ORAL | Status: DC
  Administered 2020-07-05 – 2020-07-12 (×8): 5 mg via ORAL
  Filled 2020-07-05 (×8): qty 1

## 2020-07-05 MED ORDER — SODIUM CHLORIDE 0.9 % IV SOLN: 250.0000 mL | INTRAVENOUS | Status: DC | PRN

## 2020-07-05 MED ORDER — LIDOCAINE 5 % EX PTCH
1.0000 | MEDICATED_PATCH | CUTANEOUS | Status: DC
  Administered 2020-07-05 – 2020-07-12 (×8): 1 via TRANSDERMAL
  Filled 2020-07-05 (×8): qty 1

## 2020-07-05 MED ORDER — CLOPIDOGREL BISULFATE 75 MG PO TABS
75.0000 mg | ORAL_TABLET | Freq: Every day | ORAL | Status: DC
  Administered 2020-07-05 – 2020-07-13 (×9): 75 mg via ORAL
  Filled 2020-07-05 (×9): qty 1

## 2020-07-05 MED ORDER — SODIUM CHLORIDE 0.9% FLUSH
3.0000 mL | Freq: Two times a day (BID) | INTRAVENOUS | Status: DC
  Administered 2020-07-06 – 2020-07-12 (×13): 3 mL via INTRAVENOUS

## 2020-07-05 MED ORDER — SODIUM CHLORIDE 0.9% FLUSH: 3.0000 mL | INTRAVENOUS | Status: DC | PRN

## 2020-07-05 MED ORDER — HEPARIN SODIUM (PORCINE) 5000 UNIT/ML IJ SOLN
5000.0000 [IU] | Freq: Three times a day (TID) | INTRAMUSCULAR | Status: DC
  Administered 2020-07-05 – 2020-07-13 (×22): 5000 [IU] via SUBCUTANEOUS
  Filled 2020-07-05 (×22): qty 1

## 2020-07-05 MED ORDER — RAMELTEON 8 MG PO TABS
8.0000 mg | ORAL_TABLET | Freq: Every day | ORAL | Status: DC
  Administered 2020-07-05 – 2020-07-12 (×8): 8 mg via ORAL
  Filled 2020-07-05 (×9): qty 1

## 2020-07-05 MED ORDER — DICLOFENAC SODIUM 1 % EX GEL
2.0000 g | Freq: Four times a day (QID) | CUTANEOUS | Status: DC | PRN
  Administered 2020-07-07: 13:00:00 2 g via TOPICAL
  Filled 2020-07-05: qty 100

## 2020-07-05 MED ORDER — TRAMADOL HCL 50 MG PO TABS: 25.0000 mg | ORAL_TABLET | Freq: Four times a day (QID) | ORAL | Status: DC | PRN

## 2020-07-05 MED ORDER — ACETAMINOPHEN 325 MG PO TABS
650.0000 mg | ORAL_TABLET | ORAL | Status: DC | PRN
  Administered 2020-07-06: 650 mg via ORAL
  Filled 2020-07-05: qty 2

## 2020-07-05 MED ORDER — PANTOPRAZOLE SODIUM 40 MG PO TBEC
40.0000 mg | DELAYED_RELEASE_TABLET | Freq: Every day | ORAL | Status: DC
  Administered 2020-07-06 – 2020-07-13 (×8): 40 mg via ORAL
  Filled 2020-07-05 (×8): qty 1

## 2020-07-05 MED ORDER — CARVEDILOL 3.125 MG PO TABS
3.1250 mg | ORAL_TABLET | Freq: Two times a day (BID) | ORAL | Status: DC
  Administered 2020-07-05 – 2020-07-06 (×2): 3.125 mg via ORAL
  Filled 2020-07-05 (×3): qty 1

## 2020-07-05 MED ORDER — FUROSEMIDE 10 MG/ML IJ SOLN: 20.0000 mg | Freq: Four times a day (QID) | INTRAMUSCULAR | Status: DC

## 2020-07-05 NOTE — Progress Notes (Signed)
CALL PAGER [phone] for any questions or notifications regarding this patient  FMTS Attending Note: Dorcas Mcmurray MD I have seen and examined the patient, reviewed her chart and discussed with the admitting team. Full H& P to folow. Briefly; 1. Shortness of breath at rest due to fluid overload as evidenced by lower extremity edema and bilateral pleural effusions as a consequence of heart failure. Specifics of heart failure are unknown but this is most likley acute on chronic systolic failure given her physical exam. We will order ECHO for tomorrow. Start diuresis with IV lasix, 2. AKI unknown etiology: could be hepatorenal or secondary to recent increase in diuretic (changefrom QOD to QD). Will follow closely at this time as we plan further diuresis. 3. MSK: she actually came to ED initially with complaint of neck pain and had CT neck with no acute findings, but some HNP that is likely chronic. On my exam she did not even mention her neck pain. She did mention left hip pain and had some ttp over anterior left hip. No bruising. No real pain with log roll. Reports no falls, although her history reporting is a bit spotty, so will get hip X ray to rule out anything acute.

## 2020-07-05 NOTE — ED Triage Notes (Signed)
Patient complains of left ear pain with radiation to jaw. Denies other complaints at this time.

## 2020-07-05 NOTE — ED Provider Notes (Signed)
I provided a substantive portion of the care of this patient.  I personally performed the entirety of the medical decision making for this encounter.  EKG Interpretation  Date/Time:  Wednesday July 05 2020 12:55:55 EST Ventricular Rate:  102 PR Interval:  144 QRS Duration: 154 QT Interval:  392 QTC Calculation: 510 R Axis:   55 Text Interpretation: Sinus tachycardia Left bundle branch block Abnormal ECG No significant change since last tracing Confirmed by Lacretia Leigh ([id number]) on 07/05/2020 1:35:43 PM  87 year old female presents with several days of atraumatic pain to her posterior neck on the left side which radiates to her ear and down to her left anterior chest as well as left arm and left leg.  Does have a history of CVA before in the past with resultant chronic left-sided weakness..  Did have some shortness of breath last night.  Chest x-ray shows failure.  Neurological exam is stable.  Will order imaging of patient cervical spine and likely admit for CHF exacerbation   Lacretia Leigh, MD 07/05/20 1357

## 2020-07-05 NOTE — ED Provider Notes (Signed)
[facility] Provider Note   CSN: [id number] Arrival date & time: 07/05/20  1241     History Chief Complaint  Patient presents with  . Jaw Pain    Allison Gardner is a 87 y.o. female.  HPI   Pt is a 87 y/o F with a h/o CHF, dilated cardiomyopathy, kidney stones, memory loss, CVA, who presents to the ED today for eval of neck pain. Pt has had pain to the left side of the neck for the last several days. The pain radiates down her left chest. Pain is constant in nature and is severe. Daughter at bedside has given tylenol but sxs have not been completely resolved. Daughter reports residual left sided weakness from prior stroke.   Daughter and patient further report to that pt has had increased shortness of breath recently. She has also had persistent BLE swelling   Past Medical History:  Diagnosis Date  . CHF (congestive heart failure) (HCC)    EF: 30-35% in 9/21  . Dilated cardiomyopathy ([place]) 05/15/2020  . Headache(784.0)   . Kidney stones   . Memory loss   . Stroke [facility])     Patient Active Problem List   Diagnosis Date Noted  . Angiodysplasia of colon with hemorrhage   . Cirrhosis of liver without ascites ([place])   . Pressure injury of skin 05/16/2020  . Acute blood loss anemia 05/15/2020  . Peripheral arterial occlusive disease ([place]) 05/15/2020  . Chronic anticoagulation 05/15/2020  . Mixed hyperlipidemia 05/15/2020  . Dilated cardiomyopathy ([place]) 05/15/2020  . Chronic combined systolic and diastolic congestive heart failure ([place]) 05/15/2020  . GERD (gastroesophageal reflux disease) 05/15/2020  . Essential hypertension 05/15/2020  . Acute GI bleeding 05/15/2020  . Hypothyroidism 05/15/2020  . Thrombocytopenia ([place]) 05/15/2020  . Chronic kidney disease, stage 3b ([place]) 05/15/2020  . Hyperkalemia 05/15/2020  . GI bleed 05/15/2020  . Abnormal CT scan, colon   . Foot drop, left 09/28/2015  . Memory loss 09/01/2013  . Mild  cognitive impairment 09/01/2013  . Fracture of pubic ramus ([place]) 04/15/2013  . Late effects of cerebrovascular disease 09/03/2012  . Headache(784.0) 09/03/2012  . Dizziness and giddiness 09/03/2012    Past Surgical History:  Procedure Laterality Date  . ABDOMINAL HYSTERECTOMY    . APPENDECTOMY    . COLONOSCOPY WITH PROPOFOL N/A 05/17/2020   Procedure: COLONOSCOPY WITH PROPOFOL;  Surgeon: Jerene Bears, MD;  Location: [place];  Service: Gastroenterology;  Laterality: N/A;  . ESOPHAGOGASTRODUODENOSCOPY (EGD) WITH PROPOFOL N/A 05/17/2020   Procedure: ESOPHAGOGASTRODUODENOSCOPY (EGD) WITH PROPOFOL;  Surgeon: Jerene Bears, MD;  Location: [facility] ENDOSCOPY;  Service: Gastroenterology;  Laterality: N/A;  . HEMOSTASIS CLIP PLACEMENT  05/17/2020   Procedure: HEMOSTASIS CLIP PLACEMENT;  Surgeon: Jerene Bears, MD;  Location: [facility] ENDOSCOPY;  Service: Gastroenterology;;  . HOT HEMOSTASIS N/A 05/17/2020   Procedure: HOT HEMOSTASIS (ARGON PLASMA COAGULATION/BICAP);  Surgeon: Jerene Bears, MD;  Location: [facility] ENDOSCOPY;  Service: Gastroenterology;  Laterality: N/A;     OB History   No obstetric history on file.     No family history on file.  Social History   Tobacco Use  . Smoking status: Never Smoker  . Smokeless tobacco: Never Used  Substance Use Topics  . Alcohol use: No  . Drug use: No    Home Medications Prior to Admission medications   Medication Sig Start Date End Date Taking? Authorizing Provider  acetaminophen (TYLENOL) 325 MG tablet Take 650 mg by mouth every  6 (six) hours as needed (for pain).    [provider]  acetaminophen-codeine (TYLENOL #3) 300-30 MG tablet Take 1 tablet by mouth every 4 (four) hours as needed for moderate pain.    [provider]  furosemide (LASIX) 20 MG tablet Take 1 tablet (20 mg total) by mouth daily as needed. Take if weight gain of 2 lbs in one day or shortness of breath or swelling. 05/20/20   Cristal Ford, DO  levothyroxine  (SYNTHROID) 25 MCG tablet Take 25 mcg by mouth daily. 03/06/20   [provider]  Omega-3 Fatty Acids (FISH OIL) 1000 MG CAPS Take 1,000 mg by mouth every other day.    [provider]  pantoprazole (PROTONIX) 40 MG tablet Take 1 tablet (40 mg total) by mouth daily before breakfast. 05/20/20   Cristal Ford, DO  Rivaroxaban (XARELTO) 15 MG TABS tablet Take 1 tablet (15 mg total) by mouth daily. Discuss the need for this medication with your prescribing physician 05/19/20   Cristal Ford, DO  rosuvastatin (CRESTOR) 5 MG tablet Take 5 mg by mouth at bedtime. 08/31/15   [provider]    Allergies    Ampicillin and Demerol [meperidine]  Review of Systems   Review of Systems  Constitutional: Negative for fever.  HENT: Positive for ear pain. Negative for sore throat.   Eyes: Negative for pain and visual disturbance.  Respiratory: Positive for shortness of breath. Negative for cough.   Cardiovascular: Positive for chest pain and leg swelling.  Gastrointestinal: Negative for abdominal pain, constipation, diarrhea, nausea and vomiting.  Genitourinary: Negative for dysuria and hematuria.  Musculoskeletal: Positive for neck pain. Negative for back pain.  Skin: Negative for color change and rash.  Neurological: Negative for seizures and syncope.  All other systems reviewed and are negative.   Physical Exam Updated Vital Signs BP 122/69   Pulse 90   Temp 98.2 F (36.8 C)   Resp 17   SpO2 97%   Physical Exam Vitals and nursing note reviewed.  Constitutional:      General: She is not in acute distress.    Appearance: She is well-developed and well-nourished.  HENT:     Head: Normocephalic and atraumatic.  Eyes:     Conjunctiva/sclera: Conjunctivae normal.  Cardiovascular:     Rate and Rhythm: Regular rhythm. Tachycardia present.     Pulses:          Radial pulses are 2+ on the right side and 2+ on the left side.       Dorsalis pedis pulses are detected w/  Doppler on the right side and detected w/ Doppler on the left side.     Heart sounds: No murmur heard.   Pulmonary:     Effort: Pulmonary effort is normal. No respiratory distress.     Breath sounds: Rales present. No wheezing or rhonchi.  Abdominal:     General: Bowel sounds are normal.     Palpations: Abdomen is soft.     Tenderness: There is no abdominal tenderness. There is no guarding or rebound.  Musculoskeletal:     Cervical back: Neck supple.     Right lower leg: Edema present.     Left lower leg: Edema present.     Comments: TTP to the cervical spine and the left cervical paraspinous muscles. ttp to the left chest wall  Skin:    General: Skin is warm and dry.  Neurological:     Mental Status: She is alert.  Comments: Mental Status:  Alert, thought content appropriate, able to give a coherent history. Speech fluent without evidence of aphasia. Able to follow 2 step commands without difficulty.  Cranial Nerves: II-XII intact Motor:  Normal tone. 5/5 strength of BUE, BLE with decreased strength (left weaker than right which is chronic) Sensory: light touch normal in all extremities.   Psychiatric:        Mood and Affect: Mood and affect normal.     ED Results / Procedures / Treatments   Labs (all labs ordered are listed, but only abnormal results are displayed) Labs Reviewed  BASIC METABOLIC PANEL - Abnormal; Notable for the following components:      Result Value   Sodium 147 (*)    Chloride 112 (*)    BUN 29 (*)    Creatinine, Ser 2.03 (*)    GFR, Estimated 22 (*)    All other components within normal limits  CBC - Abnormal; Notable for the following components:   RBC 3.44 (*)    Hemoglobin 10.5 (*)    HCT 32.9 (*)    RDW 18.3 (*)    All other components within normal limits  TROPONIN I (HIGH SENSITIVITY) - Abnormal; Notable for the following components:   Troponin I (High Sensitivity) 111 (*)    All other components within normal limits  BRAIN  NATRIURETIC PEPTIDE  TROPONIN I (HIGH SENSITIVITY)    EKG EKG Interpretation  Date/Time:  Wednesday July 05 2020 12:55:55 EST Ventricular Rate:  102 PR Interval:  144 QRS Duration: 154 QT Interval:  392 QTC Calculation: 510 R Axis:   55 Text Interpretation: Sinus tachycardia Left bundle branch block Abnormal ECG No significant change since last tracing Confirmed by Lacretia Leigh ([id number]) on 07/05/2020 1:14:18 PM   Radiology DG Chest 2 View  Result Date: 07/05/2020 CLINICAL DATA:  Chest pain.  History of CHF. EXAM: CHEST - 2 VIEW COMPARISON:  No prior. FINDINGS: Cardiomegaly with diffuse bilateral interstitial prominence and bilateral pleural effusions. Findings consistent with CHF. Pneumonitis cannot be excluded. Degenerative changes scoliosis thoracic spine. IMPRESSION: Cardiomegaly with diffuse bilateral interstitial prominence and bilateral pleural effusions consistent with CHF. Electronically Signed   By: Marcello Moores  Register   On: 07/05/2020 13:34   CT Cervical Spine Wo Contrast  Result Date: 07/05/2020 CLINICAL DATA:  Cervical radiculopathy. EXAM: CT CERVICAL SPINE WITHOUT CONTRAST TECHNIQUE: Multidetector CT imaging of the cervical spine was performed without intravenous contrast. Multiplanar CT image reconstructions were also generated. COMPARISON:  None. FINDINGS: Alignment: Straightening of normal cervical lordosis. Skull base and vertebrae: The bones appear osteopenic. No acute fracture. The vertebral body heights are well maintained. Soft tissues and spinal canal: No prevertebral fluid or swelling. No visible canal hematoma. Disc levels: Multilevel disc space narrowing and endplate spurring is identified throughout the cervical spine. This is most advanced at C4-5, C5-6 and C6-7. Broad-based disc bulge/herniation noted at C2-3, image 28/10. Central, posterior disc herniation is noted at C3-4, image 35/8 Upper chest: Posterior layering pleural effusions are noted overlying both  lungs. Aortic atherosclerosis. Other: None IMPRESSION: 1. No acute findings. 2. Advanced cervical spondylosis. 3. Central, posterior disc herniation is noted at C3-4. 4. Aortic atherosclerosis. Aortic Atherosclerosis (ICD10-I70.0). Electronically Signed   By: Kerby Moors M.D.   On: 07/05/2020 15:11    Procedures Procedures   Medications Ordered in ED Medications  lidocaine (LIDODERM) 5 % 1 patch (1 patch Transdermal Patch Applied 07/05/20 1430)  morphine 2 MG/ML injection 2 mg (2 mg Intravenous Given  07/05/20 1428)  furosemide (LASIX) injection 20 mg (20 mg Intravenous Given 07/05/20 1608)    ED Course  I have reviewed the triage vital signs and the nursing notes.  Pertinent labs & imaging results that were available during my care of the patient were reviewed by me and considered in my medical decision making (see chart for details).    MDM Rules/Calculators/A&P                          87 year old female presenting emergency department today for evaluation of left neck pain.  Also with chest pain and leg swelling.  Reviewed/interpreted labs CBC is without leukocytosis, anemia present but stable from prior BMP with hyponatremia with sodium of 147, elevated chloride at 112, AKI with BUN/creatinine at 29/2.03 which is new from prior Troponin is elevated at 111, this could be due to demand, will trend  EKG - Sinus tachycardia Left bundle branch block Abnormal ECG No significant change since last tracing  Reviewed/interpreted labs  CXR - Cardiomegaly with diffuse bilateral interstitial prominence and bilateral pleural effusions consistent with CHF. CT cervical spine - 1. No acute findings. 2. Advanced cervical spondylosis. 3. Central, posterior disc herniation is noted at C3-4. 4. Aortic atherosclerosis.  Pt with acute CHF exacerbation with increased SOB at home. Appears fluid overloaded on exam and on CXR. Iv diuretics ordered in the ED. Will admit for heart failure.   4:22 PM  CONSULT with Dr. Matilde Haymaker with internal medicine residency who accepts patient for admission  Final Clinical Impression(s) / ED Diagnoses Final diagnoses:  Acute on chronic congestive heart failure, unspecified heart failure type [facility])    Rx / DC Orders ED Discharge Orders    None       Bishop Dublin 07/05/20 1623    Lacretia Leigh, MD 07/10/20 [phone]
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
## 2020-07-05 NOTE — ED Triage Notes (Signed)
Patient is alert, oriented, and in on apparent distress at this time.

## 2020-07-05 NOTE — ED Notes (Signed)
Pt resting in bed eating mcdonalds.

## 2020-07-06 ENCOUNTER — Inpatient Hospital Stay (HOSPITAL_COMMUNITY): Payer: Medicare HMO

## 2020-07-06 ENCOUNTER — Encounter (HOSPITAL_COMMUNITY): Payer: Self-pay | Admitting: Family Medicine

## 2020-07-06 DIAGNOSIS — Z9189 Other specified personal risk factors, not elsewhere classified: Secondary | ICD-10-CM | POA: Diagnosis not present

## 2020-07-06 DIAGNOSIS — E782 Mixed hyperlipidemia: Secondary | ICD-10-CM | POA: Diagnosis present

## 2020-07-06 DIAGNOSIS — D696 Thrombocytopenia, unspecified: Secondary | ICD-10-CM | POA: Diagnosis present

## 2020-07-06 DIAGNOSIS — I5023 Acute on chronic systolic (congestive) heart failure: Secondary | ICD-10-CM

## 2020-07-06 DIAGNOSIS — Z8673 Personal history of transient ischemic attack (TIA), and cerebral infarction without residual deficits: Secondary | ICD-10-CM | POA: Diagnosis not present

## 2020-07-06 DIAGNOSIS — I509 Heart failure, unspecified: Secondary | ICD-10-CM | POA: Diagnosis not present

## 2020-07-06 DIAGNOSIS — K7460 Unspecified cirrhosis of liver: Secondary | ICD-10-CM | POA: Diagnosis present

## 2020-07-06 DIAGNOSIS — E8809 Other disorders of plasma-protein metabolism, not elsewhere classified: Secondary | ICD-10-CM | POA: Diagnosis present

## 2020-07-06 DIAGNOSIS — M6281 Muscle weakness (generalized): Secondary | ICD-10-CM | POA: Diagnosis present

## 2020-07-06 DIAGNOSIS — R54 Age-related physical debility: Secondary | ICD-10-CM | POA: Diagnosis present

## 2020-07-06 DIAGNOSIS — I5022 Chronic systolic (congestive) heart failure: Secondary | ICD-10-CM | POA: Diagnosis not present

## 2020-07-06 DIAGNOSIS — E039 Hypothyroidism, unspecified: Secondary | ICD-10-CM | POA: Diagnosis present

## 2020-07-06 DIAGNOSIS — I420 Dilated cardiomyopathy: Secondary | ICD-10-CM

## 2020-07-06 DIAGNOSIS — R6889 Other general symptoms and signs: Secondary | ICD-10-CM | POA: Diagnosis not present

## 2020-07-06 DIAGNOSIS — M160 Bilateral primary osteoarthritis of hip: Secondary | ICD-10-CM | POA: Diagnosis present

## 2020-07-06 DIAGNOSIS — N179 Acute kidney failure, unspecified: Secondary | ICD-10-CM | POA: Diagnosis present

## 2020-07-06 DIAGNOSIS — N1832 Chronic kidney disease, stage 3b: Secondary | ICD-10-CM | POA: Diagnosis present

## 2020-07-06 DIAGNOSIS — N184 Chronic kidney disease, stage 4 (severe): Secondary | ICD-10-CM | POA: Diagnosis not present

## 2020-07-06 DIAGNOSIS — K219 Gastro-esophageal reflux disease without esophagitis: Secondary | ICD-10-CM | POA: Diagnosis present

## 2020-07-06 DIAGNOSIS — K761 Chronic passive congestion of liver: Secondary | ICD-10-CM | POA: Diagnosis present

## 2020-07-06 DIAGNOSIS — Z20822 Contact with and (suspected) exposure to covid-19: Secondary | ICD-10-CM | POA: Diagnosis present

## 2020-07-06 DIAGNOSIS — I447 Left bundle-branch block, unspecified: Secondary | ICD-10-CM | POA: Diagnosis present

## 2020-07-06 DIAGNOSIS — R419 Unspecified symptoms and signs involving cognitive functions and awareness: Secondary | ICD-10-CM | POA: Diagnosis not present

## 2020-07-06 DIAGNOSIS — I130 Hypertensive heart and chronic kidney disease with heart failure and stage 1 through stage 4 chronic kidney disease, or unspecified chronic kidney disease: Secondary | ICD-10-CM | POA: Diagnosis present

## 2020-07-06 DIAGNOSIS — M5011 Cervical disc disorder with radiculopathy,  high cervical region: Secondary | ICD-10-CM | POA: Diagnosis present

## 2020-07-06 DIAGNOSIS — F0390 Unspecified dementia without behavioral disturbance: Secondary | ICD-10-CM | POA: Diagnosis present

## 2020-07-06 DIAGNOSIS — M47812 Spondylosis without myelopathy or radiculopathy, cervical region: Secondary | ICD-10-CM | POA: Diagnosis not present

## 2020-07-06 DIAGNOSIS — Z6820 Body mass index (BMI) 20.0-20.9, adult: Secondary | ICD-10-CM | POA: Diagnosis not present

## 2020-07-06 DIAGNOSIS — E440 Moderate protein-calorie malnutrition: Secondary | ICD-10-CM | POA: Diagnosis present

## 2020-07-06 DIAGNOSIS — M25559 Pain in unspecified hip: Secondary | ICD-10-CM

## 2020-07-06 DIAGNOSIS — M4722 Other spondylosis with radiculopathy, cervical region: Secondary | ICD-10-CM | POA: Diagnosis present

## 2020-07-06 DIAGNOSIS — R000 Tachycardia, unspecified: Secondary | ICD-10-CM | POA: Diagnosis present

## 2020-07-06 DIAGNOSIS — R413 Other amnesia: Secondary | ICD-10-CM | POA: Diagnosis present

## 2020-07-06 DIAGNOSIS — I5043 Acute on chronic combined systolic (congestive) and diastolic (congestive) heart failure: Secondary | ICD-10-CM | POA: Diagnosis present

## 2020-07-06 LAB — BASIC METABOLIC PANEL
Anion gap: 11 (ref 5–15)
BUN: 30 mg/dL — ABNORMAL HIGH (ref 8–23)
CO2: 23 mmol/L (ref 22–32)
Calcium: 8.9 mg/dL (ref 8.9–10.3)
Chloride: 111 mmol/L (ref 98–111)
Creatinine, Ser: 1.96 mg/dL — ABNORMAL HIGH (ref 0.44–1.00)
GFR, Estimated: 23 mL/min — ABNORMAL LOW (ref >60)
Glucose, Bld: 102 mg/dL — ABNORMAL HIGH (ref 70–99)
Potassium: 4.4 mmol/L (ref 3.5–5.1)
Sodium: 145 mmol/L (ref 135–145)

## 2020-07-06 LAB — MAGNESIUM: Magnesium: 2.1 mg/dL (ref 1.7–2.4)

## 2020-07-06 LAB — ECHOCARDIOGRAM COMPLETE
Area-P 1/2: 4.31 cm2
MV M vel: 5.23 m/s
MV Peak grad: 109.2 mmHg
P 1/2 time: 307 msec
Radius: 0.4 cm
S' Lateral: 4.6 cm
Weight: 1899.2 oz

## 2020-07-06 LAB — CBC
HCT: 35.9 % — ABNORMAL LOW (ref 36.0–46.0)
Hemoglobin: 10.9 g/dL — ABNORMAL LOW (ref 12.0–15.0)
MCH: 29.8 pg (ref 26.0–34.0)
MCHC: 30.4 g/dL (ref 30.0–36.0)
MCV: 98.1 fL (ref 80.0–100.0)
Platelets: 160 10*3/uL (ref 150–400)
RBC: 3.66 MIL/uL — ABNORMAL LOW (ref 3.87–5.11)
RDW: 18 % — ABNORMAL HIGH (ref 11.5–15.5)
WBC: 3.3 10*3/uL — ABNORMAL LOW (ref 4.0–10.5)
nRBC: 0 % (ref 0.0–0.2)

## 2020-07-06 MED ORDER — CARVEDILOL 3.125 MG PO TABS
3.1250 mg | ORAL_TABLET | Freq: Every day | ORAL | Status: DC
Start: 2020-07-07 — End: 2020-07-13
  Administered 2020-07-07 – 2020-07-12 (×6): 3.125 mg via ORAL
  Filled 2020-07-06 (×6): qty 1

## 2020-07-06 MED ORDER — FUROSEMIDE 10 MG/ML IJ SOLN
20.0000 mg | Freq: Three times a day (TID) | INTRAMUSCULAR | Status: DC
  Administered 2020-07-06 – 2020-07-07 (×3): 20 mg via INTRAVENOUS
  Filled 2020-07-06 (×3): qty 2
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
## 2020-07-06 NOTE — Progress Notes (Signed)
  Echocardiogram 2D Echocardiogram has been performed.  Allison Gardner 07/06/2020, 11:50 AM

## 2020-07-06 NOTE — ED Notes (Signed)
Tele Breakfast order placed 

## 2020-07-06 NOTE — Evaluation (Signed)
Physical Therapy Evaluation Patient Details Name: Allison Gardner MRN: [id number] DOB: Jan 14, 1923 Today's Date: 07/06/2020   History of Present Illness  Pt is a 87 y/o female admitted secondary to L neck pain and SOB. Thought to be secondary to CHF exacerbation. PMH includes CVA with L sided weakness, CHF, CKD, liver cirrhosis, HTN, and memory loss.  Clinical Impression  Pt admitted secondary to problem above with deficits below. Pt requiring min guard A for transfers and gait with RW. Noted functional weakness in LLE, however, pt with LLE weakness at baseline. Pt with cognitive deficits, but anticipate she is close to her baseline. Recommending HHPT and 24/7 assist at d/c. Will continue to follow acutely.      Follow Up Recommendations Home health PT;Supervision/Assistance - 24 hour    Equipment Recommendations  None recommended by PT    Recommendations for Other Services       Precautions / Restrictions Precautions Precautions: Fall Restrictions Weight Bearing Restrictions: No      Mobility  Bed Mobility Overal bed mobility: Needs Assistance Bed Mobility: Supine to Sit;Sit to Supine     Supine to sit: Min assist Sit to supine: Max assist;+2 for physical assistance   General bed mobility comments: Min A for trunk assist to come to sitting. Max A +2 to help with getting back up on stretcher and for safety to return to supine.    Transfers Overall transfer level: Needs assistance Equipment used: Rolling walker (2 wheeled) Transfers: Sit to/from Stand Sit to Stand: Min guard;+2 safety/equipment         General transfer comment: Min guard for safety.  Ambulation/Gait Ambulation/Gait assistance: Min guard;+2 safety/equipment Gait Distance (Feet): 15 Feet Assistive device: Rolling walker (2 wheeled) Gait Pattern/deviations: Step-to pattern;Decreased step length - right;Decreased step length - left;Decreased weight shift to left Gait velocity: Decreased    General Gait Details: Functional weakness noted in LLE. Tended to drag LLE during gait. Min guard for safety. Cues for safety with RW.  Stairs            Wheelchair Mobility    Modified Rankin (Stroke Patients Only)       Balance Overall balance assessment: Needs assistance Sitting-balance support: Feet supported Sitting balance-Leahy Scale: Fair     Standing balance support: Bilateral upper extremity supported;No upper extremity supported Standing balance-Leahy Scale: Fair Standing balance comment: Able to stand at sink without UE support                             Pertinent Vitals/Pain Pain Assessment: Faces Faces Pain Scale: Hurts little more Pain Location: L neck Pain Descriptors / Indicators: Aching Pain Intervention(s): Limited activity within patient's tolerance;Monitored during session;Repositioned    Home Living Family/patient expects to be discharged to:: Private residence Living Arrangements: Children Available Help at Discharge: Family;Available 24 hours/day;Other (Comment) Type of Home: House Home Access: Stairs to enter Entrance Stairs-Rails: None Entrance Stairs-Number of Steps: 2 Home Layout: Laundry or work area in basement;Multi-level Lucas: Clinical cytogeneticist - 2 wheels;Cane - single point      Prior Function Level of Independence: Needs assistance   Gait / Transfers Assistance Needed: ambulates with cane/RW for household distances  ADL's / Homemaking Assistance Needed: Daughter helps with tub transfers. bathes and dresses herself, fixes coffee family provides for the rest of her iADLs        Hand Dominance        Extremity/Trunk Assessment   Upper  Extremity Assessment Upper Extremity Assessment: Defer to OT evaluation    Lower Extremity Assessment Lower Extremity Assessment: LLE deficits/detail LLE Deficits / Details: LLE weakness at baseline.    Cervical / Trunk Assessment Cervical / Trunk Assessment:  Kyphotic  Communication   Communication: No difficulties  Cognition Arousal/Alertness: Awake/alert Behavior During Therapy: WFL for tasks assessed/performed Overall Cognitive Status: History of cognitive impairments - at baseline                                 General Comments: Only oriented to person. Likely close to baseline.      General Comments      Exercises     Assessment/Plan    PT Assessment Patient needs continued PT services  PT Problem List Decreased strength;Decreased balance;Decreased mobility;Decreased cognition;Decreased knowledge of use of DME;Decreased safety awareness;Decreased knowledge of precautions       PT Treatment Interventions DME instruction;Gait training;Functional mobility training;Therapeutic activities;Therapeutic exercise;Balance training;Stair training;Patient/family education;Cognitive remediation    PT Goals (Current goals can be found in the Care Plan section)  Acute Rehab PT Goals Patient Stated Goal: for her neck to stop hurting PT Goal Formulation: With patient Time For Goal Achievement: 07/20/20 Potential to Achieve Goals: Good    Frequency Min 3X/week   Barriers to discharge        Co-evaluation PT/OT/SLP Co-Evaluation/Treatment: Yes Reason for Co-Treatment: To address functional/ADL transfers;For patient/therapist safety PT goals addressed during session: Balance;Mobility/safety with mobility;Proper use of DME         AM-PAC PT "6 Clicks" Mobility  Outcome Measure Help needed turning from your back to your side while in a flat bed without using bedrails?: A Little Help needed moving from lying on your back to sitting on the side of a flat bed without using bedrails?: A Little Help needed moving to and from a bed to a chair (including a wheelchair)?: A Little Help needed standing up from a chair using your arms (e.g., wheelchair or bedside chair)?: A Little Help needed to walk in hospital room?: A  Little Help needed climbing 3-5 steps with a railing? : A Lot 6 Click Score: 17    End of Session   Activity Tolerance: Patient tolerated treatment well Patient left: in bed;with call bell/phone within reach (on stretcher in ED) Nurse Communication: Mobility status PT Visit Diagnosis: Unsteadiness on feet (R26.81);Muscle weakness (generalized) (M62.81)    Time: [id number] PT Time Calculation (min) (ACUTE ONLY): 30 min   Charges:   PT Evaluation $PT Eval Moderate Complexity: 1 Mod          Reuel Derby, PT, DPT  Acute Rehabilitation Services  Pager: [phone] Office: [phone]   Rudean Hitt 07/06/2020, 2:11 PM

## 2020-07-06 NOTE — Progress Notes (Signed)
Occupational Therapy Evaluation  PTA pt lives at home and family assists as needed. Pt states she is able to ambulate the the toilet independently but family assists with self care and IADL tasks. VSS during session. REcommend DC home with 247 S and HHOT. Will follow acutely.     07/06/20 1500  OT Visit Information  Last OT Received On 07/06/20  Assistance Needed +1  PT/OT/SLP Co-Evaluation/Treatment Yes  Reason for Co-Treatment For patient/therapist safety;To address functional/ADL transfers  OT goals addressed during session ADL's and self-care  History of Present Illness Pt is a 87 y/o female admitted secondary to L neck pain and SOB. Thought to be secondary to CHF exacerbation. PMH includes CVA with L sided weakness, CHF, CKD, liver cirrhosis, HTN, and memory loss.  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 bathes and dresses herself, fixes coffee family provides for the rest of her iADLs  Comments enjoys sewing adn cooking  Communication  Communication No difficulties  Pain Assessment  Pain Assessment Faces  Faces Pain Scale 4  Pain Location L neck  Pain Descriptors / Indicators Aching  Pain Intervention(s) Limited activity within  patient's tolerance;Heat applied  Cognition  Arousal/Alertness Awake/alert  Behavior During Therapy WFL for tasks assessed/performed  Overall Cognitive Status History of cognitive impairments - at baseline  General Comments Only oriented to person. Likely close to baseline.  Upper Extremity Assessment  Upper Extremity Assessment Generalized weakness (L hemiparesis at baseline but uses as functional assist)  Lower Extremity Assessment  Lower Extremity Assessment Defer to PT evaluation  LLE Deficits / Details LLE weakness at baseline.  Cervical / Trunk Assessment  Cervical / Trunk Assessment Kyphotic  ADL  Overall ADL's  Needs assistance/impaired  Eating/Feeding Modified independent  Grooming Min guard;Standing  Upper Body Bathing Set up;Sitting  Lower Body Bathing Minimal assistance;Sit to/from stand  Upper Body Dressing  Set up;Sitting  Lower Body Dressing Minimal assistance;Sit to/from Retail buyer Minimal assistance;Ambulation;RW  Toileting- Forensic psychologist Min guard;Sit to/from stand  Toileting - Clothing Manipulation Details (indicate cue type and reason) continenet and aware of using the Purewick  Functional mobility during ADLs Minimal assistance;Rolling walker;Cueing for safety (steady assist)  Bed Mobility  Overal bed mobility Needs Assistance  Bed Mobility Supine to Sit;Sit to Supine  Supine to sit Min assist  Sit to supine Max assist;+2 for physical assistance (for safety only due to heigh of stretcher)  General bed mobility comments Min A for trunk assist to come to sitting. Max A +2 to help with getting back up on stretcher and for safety to return to supine.  Transfers  Overall transfer level Needs assistance  Equipment used Rolling walker (2 wheeled)  Transfers Sit to/from Stand  Sit to Stand Min guard;+2 safety/equipment  General transfer comment Min guard for safety.  Balance  Overall  balance assessment Needs assistance   Sitting-balance support Feet supported  Sitting balance-Leahy Scale Fair  Standing balance support Bilateral upper extremity supported;No upper extremity supported  Standing balance-Leahy Scale Fair  Standing balance comment Able to stand at sink without UE support  OT - End of Session  Equipment Utilized During Treatment Gait belt;Rolling walker  Activity Tolerance Patient tolerated treatment well  Patient left in bed;with call bell/phone within reach  Nurse Communication Mobility status  OT Assessment  OT Recommendation/Assessment Patient needs continued OT Services  OT Visit Diagnosis Unsteadiness on feet (R26.81);Other abnormalities of gait and mobility (R26.89);Muscle weakness (generalized) (M62.81);Other symptoms and signs involving cognitive function  OT Problem List Decreased strength;Decreased activity tolerance;Impaired balance (sitting and/or standing);Decreased cognition;Decreased safety awareness;Decreased knowledge of use of DME or AE;Cardiopulmonary status limiting activity  OT Plan  OT Frequency (ACUTE ONLY) Min 2X/week  OT Treatment/Interventions (ACUTE ONLY) Self-care/ADL training;Therapeutic exercise;Neuromuscular education;Energy conservation;DME and/or AE instruction;Therapeutic activities;Cognitive remediation/compensation;Patient/family education;Balance training  AM-PAC OT "6 Clicks" Daily Activity Outcome Measure (Version 2)  Help from another person eating meals? 4  Help from another person taking care of personal grooming? 3  Help from another person toileting, which includes using toliet, bedpan, or urinal? 3  Help from another person bathing (including washing, rinsing, drying)? 3  Help from another person to put on and taking off regular upper body clothing? 3  Help from another person to put on and taking off regular lower body clothing? 3  6 Click Score 19  OT Recommendation  Follow Up Recommendations Home health OT;Supervision/Assistance - 24 hour  OT  Equipment None recommended by OT  Individuals Consulted  Consulted and Agree with Results and Recommendations Patient unable/family or caregiver not available  Acute Rehab OT Goals  Patient Stated Goal for her neck to stop hurting  OT Goal Formulation Patient unable to participate in goal setting  Time For Goal Achievement 07/20/20  Potential to Achieve Goals Good  OT Time Calculation  OT Start Time (ACUTE ONLY) 1139  OT Stop Time (ACUTE ONLY) 1208  OT Time Calculation (min) 29 min  OT General Charges  $OT Visit 1 Visit  OT Evaluation  $OT Eval Moderate Complexity 1 Mod  Written Expression  Dominant Hand Right  Maurie Boettcher, OT/L   Acute OT Clinical Specialist Acute Rehabilitation Services Pager [phone] Office [phone]

## 2020-07-06 NOTE — Progress Notes (Addendum)
Family Medicine Teaching Service Daily Progress Note Intern Pager: [phone]  Patient name: Allison Gardner Medical record number: [id number] Date of birth: [birth year] Age: 87 y.o. Gender: female  Primary Care Provider: Andres Shad, MD Consultants: None Code Status: Full  Pt Overview and Major Events to Date:  2/23 admitted  Assessment and Plan: [name] is a 87 y.o. female presenting with left sided pain of neck that radiates to her chest and also shortness of breath. PMH is significant for CHF (systolic and diastolic), CKD stage IIIb, liver cirrhosis without ascites, HTN, GERD, hypothyroidism, memory loss, h/o GI bleed, HLD, PAD, thrombocytopenia, pressure sores.   Shortness of breath, BLE edema likely CHF exacerbation Did well overnight, only complaint continued left neck pain and left leg pain with palpation.  Vital signs stable overnight.  Doing well on room air, did not require O2 overnight.  Today BLE edema stable from yesterday, 3+ pitting of lower up to 1+ pitting at hip. Scheduled for IV Lasix twice daily this morning as below.  UOP overnight 700 mL.  Weight this morning 53.8 kg, believe dry weight closer to 48 kg or 43 kg per chart review.  -Continue home carvedilol -Hold home Entresto due to AKI as below -Lasix 20 mg IV TID starting today at 0800; will titrate to increase UOP with consideration for AKI -Awaiting echo, scheduled -Vitals per unit routine -Continuous pulse ox -Continuous cardiac telemetry -Daily weights -Strict I's and O's -PT OT eval and treat  Left neck and chest pain  Herniated disk cervical spine Acute exacerbation, chronic problem.  CT C-spine 2/23 - for acute, demonstrated central posterior disc herniation at C3-4.  Home med Tylenol with codeine. -Hold home Tylenol with codeine -Tramadol as needed for pain -Heat, ice, Voltaren gel  AKI  CKD stage IIIb Creatinine today 1.96 down from 2.03 on admission.  Possible  cardiorenal.  Baseline thought to be 1.30-1.35. -Holding Entresto due to AKI -AM BMPs  Left leg weakness  History of stroke Bilateral hip x-rays negative for acute fracture, demonstrate mild osteoarthritis. -Continue home program on rosuvastatin -Acute CHF treatment as above  Hypothyroidism Takes levothyroxine at home.  TSH on admission 2.997, normal. -Continue home levothyroxine  GERD At home takes pantoprazole 40 mg. -We will continue while admitted -Consider DC on discharge due to age and likely fall complications   FEN/GI: Regular diet PPx: Heparin subcu (unknown ideal weight)   Status is: Observation  The patient remains OBS appropriate and will d/c before 2 midnights.  Dispo:  Patient From: Home  Planned Disposition: Home  Expected discharge date: 07/09/2020  Medically stable for discharge: No      Subjective:  Did well overnight, only complaint continued left neck pain and left leg pain with palpation.  Vital signs stable overnight.  Doing well on room air, did not require O2 overnight.  Today BLE edema stable from yesterday, 3+ pitting of lower up to 1+ pitting at hip.  Objective: Temp:  [97.3 F (36.3 C)-98.2 F (36.8 C)] 97.3 F (36.3 C) (02/24 0720) Pulse Rate:  [53-106] 83 (02/24 1000) Resp:  [12-39] 15 (02/24 1000) BP: (104-134)/(61-90) 119/76 (02/24 1000) SpO2:  [93 %-100 %] 97 % (02/24 1000) Weight:  [53.8 kg] 53.8 kg (02/24 0752)  Physical Exam General: Awake, alert, oriented Cardiovascular: Regular rate and rhythm, S1 and S2 present, no murmurs auscultated Respiratory: Rales and crackles of posterior lung bases, prolonged expiratory wheezing of upper lungs Extremities: 3+ pitting edema BLE from ankle to  knee, 1+ pitting edema at level of hip bilaterally, edema TTP Neuro: Cranial nerves II through X grossly intact, able to move all extremities spontaneously    Laboratory: Recent Labs  Lab 07/05/20 1256 07/06/20 0218  WBC 4.7 3.3*  HGB  10.5* 10.9*  HCT 32.9* 35.9*  PLT 173 160   Recent Labs  Lab 07/05/20 1256 07/06/20 0218  NA 147* 145  K 4.5 4.4  CL 112* 111  CO2 25 23  BUN 29* 30*  CREATININE 2.03* 1.96*  CALCIUM 9.1 8.9  GLUCOSE 88 102*    Imaging/Diagnostic Tests: DG HIP (WITH OR WITHOUT PELVIS) 2-3V LEFT; DG HIP (WITH OR WITHOUT PELVIS) 2-3V RIGHT 07/06/2020 COMPARISON:  None. FINDINGS: Frontal view of the pelvis as well as frontal and frogleg lateral views of both hips are obtained. No acute fracture, subluxation, or dislocation within either hip. Mild symmetrical hip osteoarthritis. Mild spondylosis and facet hypertrophy at the lumbosacral junction. Soft tissues are unremarkable. IMPRESSION: 1. Symmetrical bilateral hip osteoarthritis.  No acute fracture.  [name], MD 07/06/2020, 10:50 AM PGY-1, [place] Intern pager: [phone], text pages welcome

## 2020-07-07 ENCOUNTER — Inpatient Hospital Stay (HOSPITAL_COMMUNITY): Payer: Medicare HMO

## 2020-07-07 ENCOUNTER — Encounter (HOSPITAL_COMMUNITY): Payer: Self-pay | Admitting: Family Medicine

## 2020-07-07 DIAGNOSIS — I5023 Acute on chronic systolic (congestive) heart failure: Secondary | ICD-10-CM | POA: Diagnosis not present

## 2020-07-07 DIAGNOSIS — M5030 Other cervical disc degeneration, unspecified cervical region: Secondary | ICD-10-CM

## 2020-07-07 DIAGNOSIS — Z9189 Other specified personal risk factors, not elsewhere classified: Secondary | ICD-10-CM

## 2020-07-07 DIAGNOSIS — D696 Thrombocytopenia, unspecified: Secondary | ICD-10-CM

## 2020-07-07 DIAGNOSIS — I5022 Chronic systolic (congestive) heart failure: Secondary | ICD-10-CM

## 2020-07-07 DIAGNOSIS — M47812 Spondylosis without myelopathy or radiculopathy, cervical region: Secondary | ICD-10-CM | POA: Diagnosis not present

## 2020-07-07 DIAGNOSIS — E440 Moderate protein-calorie malnutrition: Secondary | ICD-10-CM | POA: Insufficient documentation

## 2020-07-07 DIAGNOSIS — K7460 Unspecified cirrhosis of liver: Secondary | ICD-10-CM

## 2020-07-07 DIAGNOSIS — Z7409 Other reduced mobility: Secondary | ICD-10-CM | POA: Diagnosis present

## 2020-07-07 DIAGNOSIS — M25559 Pain in unspecified hip: Secondary | ICD-10-CM

## 2020-07-07 DIAGNOSIS — M160 Bilateral primary osteoarthritis of hip: Secondary | ICD-10-CM | POA: Diagnosis not present

## 2020-07-07 DIAGNOSIS — G3184 Mild cognitive impairment, so stated: Secondary | ICD-10-CM

## 2020-07-07 DIAGNOSIS — R519 Headache, unspecified: Secondary | ICD-10-CM

## 2020-07-07 DIAGNOSIS — E8809 Other disorders of plasma-protein metabolism, not elsewhere classified: Secondary | ICD-10-CM

## 2020-07-07 DIAGNOSIS — M542 Cervicalgia: Secondary | ICD-10-CM

## 2020-07-07 HISTORY — DX: Cervicalgia: M54.2

## 2020-07-07 HISTORY — DX: Spondylosis without myelopathy or radiculopathy, cervical region: M47.812

## 2020-07-07 LAB — COMPREHENSIVE METABOLIC PANEL
ALT: 8 U/L (ref 0–44)
AST: 20 U/L (ref 15–41)
Albumin: 2.7 g/dL — ABNORMAL LOW (ref 3.5–5.0)
Alkaline Phosphatase: 148 U/L — ABNORMAL HIGH (ref 38–126)
Anion gap: 11 (ref 5–15)
BUN: 34 mg/dL — ABNORMAL HIGH (ref 8–23)
CO2: 23 mmol/L (ref 22–32)
Calcium: 8.6 mg/dL — ABNORMAL LOW (ref 8.9–10.3)
Chloride: 110 mmol/L (ref 98–111)
Creatinine, Ser: 2.19 mg/dL — ABNORMAL HIGH (ref 0.44–1.00)
GFR, Estimated: 20 mL/min — ABNORMAL LOW (ref >60)
Glucose, Bld: 94 mg/dL (ref 70–99)
Potassium: 4.7 mmol/L (ref 3.5–5.1)
Sodium: 144 mmol/L (ref 135–145)
Total Bilirubin: 0.9 mg/dL (ref 0.3–1.2)
Total Protein: 5.6 g/dL — ABNORMAL LOW (ref 6.5–8.1)

## 2020-07-07 LAB — CBC
HCT: 32.6 % — ABNORMAL LOW (ref 36.0–46.0)
Hemoglobin: 10 g/dL — ABNORMAL LOW (ref 12.0–15.0)
MCH: 29.3 pg (ref 26.0–34.0)
MCHC: 30.7 g/dL (ref 30.0–36.0)
MCV: 95.6 fL (ref 80.0–100.0)
Platelets: 145 10*3/uL — ABNORMAL LOW (ref 150–400)
RBC: 3.41 MIL/uL — ABNORMAL LOW (ref 3.87–5.11)
RDW: 17.9 % — ABNORMAL HIGH (ref 11.5–15.5)
WBC: 3.2 10*3/uL — ABNORMAL LOW (ref 4.0–10.5)
nRBC: 0 % (ref 0.0–0.2)

## 2020-07-07 LAB — URINALYSIS, DIPSTICK ONLY
Bilirubin Urine: NEGATIVE
Glucose, UA: NEGATIVE mg/dL
Hgb urine dipstick: NEGATIVE
Ketones, ur: NEGATIVE mg/dL
Leukocytes,Ua: NEGATIVE
Nitrite: NEGATIVE
Protein, ur: NEGATIVE mg/dL
Specific Gravity, Urine: 1.008 (ref 1.005–1.030)
pH: 5 (ref 5.0–8.0)

## 2020-07-07 LAB — BASIC METABOLIC PANEL
Anion gap: 13 (ref 5–15)
BUN: 36 mg/dL — ABNORMAL HIGH (ref 8–23)
CO2: 23 mmol/L (ref 22–32)
Calcium: 8.7 mg/dL — ABNORMAL LOW (ref 8.9–10.3)
Chloride: 108 mmol/L (ref 98–111)
Creatinine, Ser: 2.16 mg/dL — ABNORMAL HIGH (ref 0.44–1.00)
GFR, Estimated: 21 mL/min — ABNORMAL LOW (ref >60)
Glucose, Bld: 99 mg/dL (ref 70–99)
Potassium: 4.6 mmol/L (ref 3.5–5.1)
Sodium: 144 mmol/L (ref 135–145)

## 2020-07-07 MED ORDER — ENSURE ENLIVE PO LIQD
237.0000 mL | Freq: Two times a day (BID) | ORAL | Status: DC
  Administered 2020-07-07: 237 mL via ORAL

## 2020-07-07 MED ORDER — DICLOFENAC SODIUM 1 % EX GEL
4.0000 g | Freq: Four times a day (QID) | CUTANEOUS | Status: DC
  Administered 2020-07-07 – 2020-07-13 (×23): 4 g via TOPICAL
  Filled 2020-07-07: qty 100

## 2020-07-07 MED ORDER — ACETAMINOPHEN 325 MG PO TABS
650.0000 mg | ORAL_TABLET | Freq: Four times a day (QID) | ORAL | Status: AC
  Administered 2020-07-07 – 2020-07-12 (×19): 650 mg via ORAL
  Filled 2020-07-07 (×19): qty 2

## 2020-07-07 MED ORDER — BOOST / RESOURCE BREEZE PO LIQD CUSTOM
1.0000 | Freq: Three times a day (TID) | ORAL | Status: DC
  Administered 2020-07-07 – 2020-07-13 (×15): 1 via ORAL
  Filled 2020-07-07 (×3): qty 1

## 2020-07-07 MED ORDER — FUROSEMIDE 10 MG/ML IJ SOLN
40.0000 mg | Freq: Once | INTRAMUSCULAR | Status: AC
  Administered 2020-07-07: 40 mg via INTRAVENOUS
  Filled 2020-07-07: qty 4

## 2020-07-07 MED ORDER — ADULT MULTIVITAMIN W/MINERALS CH
1.0000 | ORAL_TABLET | Freq: Every day | ORAL | Status: DC
  Administered 2020-07-07 – 2020-07-13 (×7): 1 via ORAL
  Filled 2020-07-07 (×7): qty 1

## 2020-07-07 NOTE — Progress Notes (Signed)
  Mobility Specialist Criteria Algorithm Info.   Mobility Team: [facility] elevated:HOB 30 Activity: Dangled on edge of bed; Stood at bedside Range of motion: Active; Passive Level of assistance: Moderate assist, patient does 50-74% Assistive device: Front wheel walker Minutes sitting in chair:  Minutes stood: 1 minutes Minutes ambulated: 0 minutes Distance ambulated (ft): 0 ft Mobility response: Tolerated poorly; RN notified Bed Position: Semi-fowlers  Patient received lying supine in bed, agreed to participate in mobility with max encouragement. Was asleep upon entering room and was slightly disorientated to time and situation. Got to the EOB supine<sit with Mod A. Complained of 10/10 debilitating pain in LUE while donning gown. Pt explained that the cold air makes her arm ache. Required mod A sit/stand + multi modal cues while attempting to stand x3. She stood for approximately 5 seconds but could not tolerate pain in shoulder so she immediately sat down grimacing in pain. Patient declined further mobility due to pain, offered and gave patient heating packs + heated blankets to help alleviate pain. RN notified. Patient is now dangling EOB with all needs met.   07/07/2020 1:00 PM

## 2020-07-07 NOTE — Progress Notes (Addendum)
Initial Nutrition Assessment  DOCUMENTATION CODES:   Non-severe (moderate) malnutrition in context of chronic illness  INTERVENTION:   -D/c Ensure Enlive po BID, each supplement provides 350 kcal and 20 grams of protein -Boost Breeze po TID, each supplement provides 250 kcal and 9 grams of protein -MVI with minerals daily  NUTRITION DIAGNOSIS:   Moderate Malnutrition related to chronic illness (CHF) as evidenced by energy intake < or equal to 75% for > or equal to 1 month,mild fat depletion,moderate fat depletion,mild muscle depletion,moderate muscle depletion,edema.  GOAL:   Patient will meet greater than or equal to 90% of their needs  MONITOR:   PO intake,Supplement acceptance,Diet advancement,Labs,Weight trends,Skin,I & O's  REASON FOR ASSESSMENT:   Consult Assessment of nutrition requirement/status,Poor PO  ASSESSMENT:   Allison Gardner is a 87 y.o. female presenting with left sided pain of neck that radiates to her chest and also shortness of breath. PMH is significant for CHF (systolic and diastolic), CKD stage IIIb, liver cirrhosis without ascites, HTN, GERD, hypothyroidism, memory loss, h/o GI bleed, HLD, PAD, thrombocytopenia, pressure sores.  Pt admitted with CHF exacerbation.   Reviewed I/O's: -290 ml x 24 hours and -990 ml since admission  UOP: 550 ml x 24 hours  Spoke with pt at bedside, who reports very poor appetite PTA. She shares that she usually consume 1-2 meals per day, which consist of cornflakes OR toast and applesauce and a few bites of meat. Per pt, she only consumed a sausage patty and a few bites of meat today.  Pt shares that she has gained weight secondary to fluid retention. Per her report, her UBW is around 120#, but now usually weighs around 100#. Per pt, she has not been keeping up with her weight regularly as she has a broken scale. Pt with moderate lower extremity edema, which is likely masking further weight loss a swell as fat and  muscle depletions.  Discussed with pt importance of good meal and supplement intake to promote healing. Pt dislikes Ensure, but is amenable to try Boost Breeze.   Labs reviewed.   NUTRITION - FOCUSED PHYSICAL EXAM:  Flowsheet Row Most Recent Value  Orbital Region Mild depletion  Upper Arm Region Mild depletion  Thoracic and Lumbar Region No depletion  Buccal Region Mild depletion  Temple Region Mild depletion  Clavicle Bone Region Mild depletion  Clavicle and Acromion Bone Region Mild depletion  Scapular Bone Region Mild depletion  Dorsal Hand No depletion  Patellar Region No depletion  Anterior Thigh Region No depletion  Posterior Calf Region No depletion  Edema (RD Assessment) Moderate  Hair Reviewed  Eyes Reviewed  Mouth Reviewed  Skin Reviewed  Nails Reviewed       Diet Order:   Diet Order            Diet regular Room service appropriate? Yes; Fluid consistency: Thin  Diet effective now                 EDUCATION NEEDS:   Education needs have been addressed  Skin:  Skin Assessment: Reviewed RN Assessment  Last BM:  07/05/20  Height:   Ht Readings from Last 1 Encounters:  05/17/20 5' (1.524 m)    Weight:   Wt Readings from Last 1 Encounters:  07/07/20 55.8 kg    Ideal Body Weight:  45.5 kg  BMI:  Body mass index is 24.03 kg/m.  Estimated Nutritional Needs:   Kcal:  1400-1600  Protein:  65-80 grams  Fluid:  >  1.4 L    Loistine Chance, RD, LDN, [place] Registered Dietitian II Certified Diabetes Care and Education Specialist Please refer to [facility] for RD and/or RD on-call/weekend/after hours pager

## 2020-07-07 NOTE — Progress Notes (Signed)
Heart Failure Navigator Progress Note  Assessed for Heart & Vascular TOC clinic readiness.  Unfortunately at this time the patient does not meet criteria due to advanced age and functional status.   Navigator available for reassessment of patient.   Pricilla Holm, RN, BSN Heart Failure Nurse Navigator [phone]

## 2020-07-07 NOTE — Progress Notes (Signed)
Family Medicine Teaching Service Daily Progress Note Intern Pager: [phone]  Patient name: Allison Gardner Medical record number: [id number] Date of birth: 08-27-1922 Age: 87 y.o. Gender: female  Primary Care Provider: Andres Shad, MD Consultants: None Code Status: Full  Pt Overview and Major Events to Date:  2/23 admitted  Assessment and Plan: Allison C Fitzgeraldis a 87 y.o.femalepresenting with left sided pain of neck that radiates to her chest and also shortness of breath. PMH is significant forCHF (systolic and diastolic), CKD stage IIIb, liver cirrhosis without ascites, HTN, GERD, hypothyroidism, memory loss,h/oGI bleed, HLD, PAD, thrombocytopenia, pressure sores.  Shortness of breath, BLE edema likely CHF exacerbation Weight 55.8 kg this morning, up from 53.8 kg yesterday. No O2 requirement. UOP last 24 hours 550 mL with net I/O's -290 mL.  Net -190 mL since admission.  Dry weight 43 kg.  EKG yesterday demonstrated EF 2025% with severely decreased LV function, LV global hypokinesis, mild LV hypertrophy, right atrial pressure of 3 mmHg. -Lasix 20 mg IV this morning x1 -Scheduled Lasix 40 mg IV '@1500' , will follow up on urine output and response -Continue home carvedilol -Hold home Entresto due to AKI below -Continuous pulse ox -Continuous cardiac telemetry -Daily weights -Strict I's and O's  Left neck and chest painHerniated disk cervical spine Acute exacerbation, chronic problem.  CT C-spine 2/23 demonstrates central posterior disc herniation at C3-4.  Home med Tylenol with codeine. -Hold home Tylenol with codeine -Oxycodone 2.5 mg as needed preferred for pain -Heat, ice, Voltaren gel  Elevated alkaline phosphatase   Liver cirrhosis without ascites This morning alk phos 148, AST 20, ALT 8.  Diagnosis of liver cirrhosis without ascites in chart. -Monitor with morning CMP   AKI CKD stage IIIb Creatinine today 2.16, bumped from 1.96 yesterday.   2.03 on admission.  Baseline 1.3-1.35.  Creatinine increase due to Lasix as above. -Holding home Entresto -AM BMPs  Left leg weakness History of stroke Mild osteoarthritis demonstrated on bilateral hip x-rays this admission.  No acute fractures. -Continue home rosuvastatin  Hypothyroidism Home med of levothyroxine.  TSH on admission 2.97. -Continue home levothyroxine  GERD Home med pantoprazole 40 mg. -Continue pantoprazole -Consider discontinuance on discharge  FEN/GI: regular PPx: heparin subcu   Status is: Inpatient  Remains inpatient appropriate because:Persistent severe electrolyte disturbances, Ongoing diagnostic testing needed not appropriate for outpatient work up and Inpatient level of care appropriate due to severity of illness   Dispo:  Patient From: Home  Planned Disposition: Home  Medically stable for discharge: No      Subjective:  Patient feeling comfortably in bed.  Reports continued left neck pain.  Only other complaint is that she is in bed quite a bit, she is used to being up and mobile around the home.  Objective: Temp:  [97.4 F (36.3 C)-98 F (36.7 C)] 97.9 F (36.6 C) (02/25 0521) Pulse Rate:  [74-94] 75 (02/25 0521) Resp:  [13-23] 16 (02/25 0521) BP: (96-121)/(55-79) 101/60 (02/25 0521) SpO2:  [97 %-100 %] 100 % (02/25 0521) Weight:  [53.8 kg-58.2 kg] 58.2 kg (02/25 0500)   Physical Exam General: Awake, alert, no acute distress Cardiovascular: Regular rate and rhythm, S1 and S2 present, no murmurs auscultated, JVD not notable Respiratory: Mild rhonchi and crackles in posterior inferior lung fields Extremities: Pitting edema 3+ BLE from ankle to knee, 1+ at level of hip bilaterally, palpable pedal and pretibial pulses bilaterally Neuro: Cranial nerves II through X grossly intact, able to move all extremities spontaneously  Laboratory: Recent Labs  Lab 07/05/20 1256 07/06/20 0218 07/07/20 0239  WBC 4.7 3.3* 3.2*  HGB 10.5*  10.9* 10.0*  HCT 32.9* 35.9* 32.6*  PLT 173 160 145*   Recent Labs  Lab 07/05/20 1256 07/06/20 0218 07/07/20 0239  NA 147* 145 144  K 4.5 4.4 4.6  CL 112* 111 108  CO2 '25 23 23  ' BUN 29* 30* 36*  CREATININE 2.03* 1.96* 2.16*  CALCIUM 9.1 8.9 8.7*  GLUCOSE 88 102* 99    Imaging/Diagnostic Tests: ECHO 07/06/2020 IMPRESSIONS  1. Left ventricular ejection fraction, by estimation, is 20 to 25%. The  left ventricle has severely decreased function. The left ventricle  demonstrates global hypokinesis. There is mild left ventricular  hypertrophy. Left ventricular diastolic parameters  are indeterminate.  2. Right ventricular systolic function is normal. The right ventricular  size is normal. There is mildly elevated pulmonary artery systolic  pressure.  3. Left atrial size was moderately dilated.  4. Right atrial size was mildly dilated.  5. The mitral valve is normal in structure. Severe mitral valve  regurgitation. No evidence of mitral stenosis.  6. The tricuspid valve is degenerative. Tricuspid valve regurgitation is  moderate.  7. The aortic valve is tricuspid. Aortic valve regurgitation is mild.  Mild to moderate aortic valve sclerosis/calcification is present, without  any evidence of aortic stenosis.  8. The inferior vena cava is normal in size with greater than 50%  respiratory variability, suggesting right atrial pressure of 3 mmHg.   [name], MD 07/07/2020, 7:48 AM PGY-1, [place] Intern pager: [phone], text pages welcome

## 2020-07-08 ENCOUNTER — Other Ambulatory Visit: Payer: Self-pay

## 2020-07-08 DIAGNOSIS — N1832 Chronic kidney disease, stage 3b: Secondary | ICD-10-CM | POA: Diagnosis not present

## 2020-07-08 DIAGNOSIS — M47812 Spondylosis without myelopathy or radiculopathy, cervical region: Secondary | ICD-10-CM | POA: Diagnosis not present

## 2020-07-08 DIAGNOSIS — K7469 Other cirrhosis of liver: Secondary | ICD-10-CM

## 2020-07-08 DIAGNOSIS — R54 Age-related physical debility: Secondary | ICD-10-CM

## 2020-07-08 DIAGNOSIS — I5023 Acute on chronic systolic (congestive) heart failure: Secondary | ICD-10-CM | POA: Diagnosis not present

## 2020-07-08 DIAGNOSIS — M160 Bilateral primary osteoarthritis of hip: Secondary | ICD-10-CM | POA: Diagnosis not present

## 2020-07-08 DIAGNOSIS — E440 Moderate protein-calorie malnutrition: Secondary | ICD-10-CM

## 2020-07-08 LAB — CBC
HCT: 31.8 % — ABNORMAL LOW (ref 36.0–46.0)
Hemoglobin: 10.2 g/dL — ABNORMAL LOW (ref 12.0–15.0)
MCH: 29.7 pg (ref 26.0–34.0)
MCHC: 32.1 g/dL (ref 30.0–36.0)
MCV: 92.7 fL (ref 80.0–100.0)
Platelets: 153 10*3/uL (ref 150–400)
RBC: 3.43 MIL/uL — ABNORMAL LOW (ref 3.87–5.11)
RDW: 17.7 % — ABNORMAL HIGH (ref 11.5–15.5)
WBC: 3 10*3/uL — ABNORMAL LOW (ref 4.0–10.5)
nRBC: 0 % (ref 0.0–0.2)

## 2020-07-08 LAB — PROTIME-INR
INR: 1.2 (ref 0.8–1.2)
Prothrombin Time: 14.4 seconds (ref 11.4–15.2)

## 2020-07-08 LAB — COMPREHENSIVE METABOLIC PANEL
ALT: 8 U/L (ref 0–44)
AST: 17 U/L (ref 15–41)
Albumin: 2.5 g/dL — ABNORMAL LOW (ref 3.5–5.0)
Alkaline Phosphatase: 157 U/L — ABNORMAL HIGH (ref 38–126)
Anion gap: 8 (ref 5–15)
BUN: 36 mg/dL — ABNORMAL HIGH (ref 8–23)
CO2: 27 mmol/L (ref 22–32)
Calcium: 8.6 mg/dL — ABNORMAL LOW (ref 8.9–10.3)
Chloride: 107 mmol/L (ref 98–111)
Creatinine, Ser: 2.27 mg/dL — ABNORMAL HIGH (ref 0.44–1.00)
GFR, Estimated: 19 mL/min — ABNORMAL LOW (ref >60)
Glucose, Bld: 83 mg/dL (ref 70–99)
Potassium: 4.3 mmol/L (ref 3.5–5.1)
Sodium: 142 mmol/L (ref 135–145)
Total Bilirubin: 1 mg/dL (ref 0.3–1.2)
Total Protein: 5.2 g/dL — ABNORMAL LOW (ref 6.5–8.1)

## 2020-07-08 LAB — PREALBUMIN: Prealbumin: 10 mg/dL — ABNORMAL LOW (ref 18–38)

## 2020-07-08 MED ORDER — FUROSEMIDE 10 MG/ML IJ SOLN
40.0000 mg | Freq: Every day | INTRAMUSCULAR | Status: DC
  Administered 2020-07-08 – 2020-07-10 (×3): 40 mg via INTRAVENOUS
  Filled 2020-07-08 (×3): qty 4

## 2020-07-08 NOTE — Progress Notes (Signed)
Family Medicine Teaching Service Daily Progress Note Intern Pager: [phone]  Patient name: Allison Gardner Medical record number: [id number] Date of birth: 03/14/1923 Age: 87 y.o. Gender: female  Primary Care Provider: Winfield, Albert Carl, MD Consultants: None  Code Status: Full   Pt Overview and Major Events to Date:  2/23 admitted  Assessment and Plan: Allison Gardner is a 87 y.o. female presenting with left sided pain of neck that radiates to her chest and also shortness of breath. PMH is significant for CHF (systolic and diastolic), CKD stage IIIb, liver cirrhosis without ascites, HTN, GERD, hypothyroidism, memory loss, h/o GI bleed, HLD, PAD, thrombocytopenia, pressure sores.    Shortness of breath, BLE edema likely CHF exacerbation Weight 54.4kg this morning, down from 55.8 yesterday. No O2 requirement. UOP last 24 hours 2150mL (950mL overnight) with net I/O's -1847 mL since admission. Dry weight 43 kg. Pitting edema 3+ up to knee bilaterally. EKG yesterday demonstrated EF 20-25% with severely decreased LV function, LV global hypokinesis, mild LV hypertrophy, right atrial pressure of 3 mmHg. -Lasix 40 mg IV once daily and continue to monitor urine output  -Continue home carvedilol -Hold home Entresto due to AKI below -Continuous pulse ox -Continuous cardiac telemetry -Daily weights -Strict I's and O's  Cirrhosis  RUQ US showing cirrhosis with no findings of portal hypertension. Like cardiac cirrhosis   Left neck and chest pain  Herniated disk cervical spine Acute exacerbation, chronic problem.  CT C-spine 2/23 demonstrates central posterior disc herniation at C3-4.  Home med Tylenol with codeine. -Hold home Tylenol with codeine -Oxycodone 2.5 mg as needed preferred for pain -Heat, ice, Voltaren gel   Elevated alkaline phosphatase   Liver cirrhosis without ascites This morning alk phos 157, AST 17, ALT 8.  Diagnosis of liver cirrhosis without ascites in  chart. -Monitor with morning CMP    AKI  CKD stage IIIb Creatinine today 2.19, up from 2.16 yesterday. Was 2.03 on admission.  Baseline 1.3-1.35.  Creatinine increase due to Lasix as above. -Holding home Entresto -AM BMPs   Left leg weakness  History of stroke Mild osteoarthritis demonstrated on bilateral hip x-rays this admission.  No acute fractures. -Continue home rosuvastatin   Hypothyroidism Home med of levothyroxine.  TSH on admission 2.97. -Continue home levothyroxine   GERD Home med pantoprazole 40 mg. -Continue pantoprazole -Consider discontinuance on discharge   FEN/GI: regular PPx: heparin subcu  Disposition:   Subjective:  No acute events overnight Patient endorsing some headaches this morning. Also endorses urinating a lot overnight and constantly wetting the bed.   Objective: Temp:  [97.6 F (36.4 C)-98 F (36.7 C)] 98 F (36.7 C) (02/26 0003) Pulse Rate:  [73-99] 73 (02/26 0003) Resp:  [16-20] 18 (02/26 0003) BP: (97-107)/(55-76) 97/56 (02/26 0003) SpO2:  [100 %] 100 % (02/26 0003) Weight:  [55.8 kg-58.2 kg] 55.8 kg (02/25 1439) Physical Exam: General: alert, pleasant, NAD Cardiovascular: RRR, no murmurs Respiratory: mild crackles in lower lung fields bilaterally. Normal WOB Abdomen: soft, non distended, non tender  Extremities: warm, dry. BLE 3+ pitting edema to knee   Laboratory: Recent Labs  Lab 07/05/20 1256 07/06/20 0218 07/07/20 0239  WBC 4.7 3.3* 3.2*  HGB 10.5* 10.9* 10.0*  HCT 32.9* 35.9* 32.6*  PLT 173 160 145*   Recent Labs  Lab 07/05/20 1256 07/06/20 0218 07/07/20 0239  NA 147* 145 144  144  K 4.5 4.4 4.7  4.6  CL 112* 111 110  108  CO2 25 23 23    23  BUN 29* 30* 34*  36*  CREATININE 2.03* 1.96* 2.19*  2.16*  CALCIUM 9.1 8.9 8.6*  8.7*  PROT  --   --  5.6*  BILITOT  --   --  0.9  ALKPHOS  --   --  148*  ALT  --   --  8  AST  --   --  20  GLUCOSE 88 102* 94  99     Imaging/Diagnostic Tests: US Abdomen  Limited RUQ (LIVER/GB)  Result Date: 07/07/2020 CLINICAL DATA:  Cirrhosis with anasarca. EXAM: ULTRASOUND ABDOMEN LIMITED RIGHT UPPER QUADRANT COMPARISON:  None. FINDINGS: Gallbladder: No gallstones or wall thickening visualized. No sonographic Murphy sign noted by sonographer. Common bile duct: Diameter: 9 mm Liver: No focal lesion identified. Nodular contour. Coarsened and heterogeneous parenchymal echogenicity. Portal vein is patent on color Doppler imaging with normal direction of blood flow towards the liver. Other: No ascites identified. IMPRESSION: Cirrhosis with no findings of portal hypertension. Limited evaluation for focal hepatic lesion. Electronically Signed   By: Morgane  Naveau M.D.   On: 07/07/2020 22:55    ,  J, DO 07/08/2020, 2:05 AM PGY-1, [facility] FPTS Intern pager: [phone], text pages welcome 

## 2020-07-09 DIAGNOSIS — R419 Unspecified symptoms and signs involving cognitive functions and awareness: Secondary | ICD-10-CM

## 2020-07-09 DIAGNOSIS — R6889 Other general symptoms and signs: Secondary | ICD-10-CM

## 2020-07-09 DIAGNOSIS — M6281 Muscle weakness (generalized): Secondary | ICD-10-CM

## 2020-07-09 DIAGNOSIS — R2681 Unsteadiness on feet: Secondary | ICD-10-CM

## 2020-07-09 DIAGNOSIS — R52 Pain, unspecified: Secondary | ICD-10-CM

## 2020-07-09 DIAGNOSIS — Z9189 Other specified personal risk factors, not elsewhere classified: Secondary | ICD-10-CM | POA: Diagnosis not present

## 2020-07-09 DIAGNOSIS — K761 Chronic passive congestion of liver: Secondary | ICD-10-CM

## 2020-07-09 DIAGNOSIS — I5023 Acute on chronic systolic (congestive) heart failure: Secondary | ICD-10-CM | POA: Diagnosis not present

## 2020-07-09 DIAGNOSIS — I509 Heart failure, unspecified: Secondary | ICD-10-CM | POA: Diagnosis not present

## 2020-07-09 DIAGNOSIS — M160 Bilateral primary osteoarthritis of hip: Secondary | ICD-10-CM | POA: Diagnosis not present

## 2020-07-09 LAB — COMPREHENSIVE METABOLIC PANEL
ALT: 12 U/L (ref 0–44)
AST: 29 U/L (ref 15–41)
Albumin: 2.8 g/dL — ABNORMAL LOW (ref 3.5–5.0)
Alkaline Phosphatase: 171 U/L — ABNORMAL HIGH (ref 38–126)
Anion gap: 11 (ref 5–15)
BUN: 33 mg/dL — ABNORMAL HIGH (ref 8–23)
CO2: 26 mmol/L (ref 22–32)
Calcium: 9.1 mg/dL (ref 8.9–10.3)
Chloride: 108 mmol/L (ref 98–111)
Creatinine, Ser: 1.99 mg/dL — ABNORMAL HIGH (ref 0.44–1.00)
GFR, Estimated: 23 mL/min — ABNORMAL LOW (ref >60)
Glucose, Bld: 80 mg/dL (ref 70–99)
Potassium: 4.1 mmol/L (ref 3.5–5.1)
Sodium: 145 mmol/L (ref 135–145)
Total Bilirubin: 1.1 mg/dL (ref 0.3–1.2)
Total Protein: 6.1 g/dL — ABNORMAL LOW (ref 6.5–8.1)

## 2020-07-09 LAB — CBC
HCT: 37.1 % (ref 36.0–46.0)
Hemoglobin: 11.9 g/dL — ABNORMAL LOW (ref 12.0–15.0)
MCH: 29.6 pg (ref 26.0–34.0)
MCHC: 32.1 g/dL (ref 30.0–36.0)
MCV: 92.3 fL (ref 80.0–100.0)
Platelets: 173 10*3/uL (ref 150–400)
RBC: 4.02 MIL/uL (ref 3.87–5.11)
RDW: 17.8 % — ABNORMAL HIGH (ref 11.5–15.5)
WBC: 4.1 10*3/uL (ref 4.0–10.5)
nRBC: 0 % (ref 0.0–0.2)

## 2020-07-09 NOTE — Progress Notes (Addendum)
Family Medicine Teaching Service Daily Progress Note Intern Pager: [phone]  Patient name: Allison Gardner Medical record number: [id number] Date of birth: 1922/06/13 Age: 87 y.o. Gender: female  Primary Care Provider: Andres Shad, MD Consultants: None Code Status: Full  Pt Overview and Major Events to Date:  2/23 admitted  Assessment and Plan: Nickola C Fitzgeraldis a 87 y.o.femalepresenting with acute CHF exacerbation and acute on chronic left neck pain. PMH is significant forCHF (systolic and diastolic), CKD stage IIIb, liver cirrhosis without ascites, HTN, GERD, hypothyroidism, memory loss,h/oGI bleed, HLD, PAD, thrombocytopenia, pressure sores.  Acute on chronic systolic (congestive) heart failure Weight this morning 53.7 kg, down from 54.4 kg yesterday.  UOP last 24 hours 1825 mL with net I/Os -982 mL.  Dry weight 43 kg. -Continue Lasix 40 mg IV daily -Daily UOP goal >1 L -Continue home carvedilol -Holding home Entresto -Vitals and pulse ox per unit routine -DC continuous telemetry -Daily weights -Strict I's and O's  Left neck and chest painHerniated disk cervical spine Patient reports improved pain from admission, however still bothersome. -Up out of bed -Ambulate with assistance -Oxycodone 2.5 mg as needed for pain -Heat, ice, Voltaren gel  Mild cognitive impairment Patient reports still doing many ADLs at home. -Delirium precautions -Up out of bed, ambulate with assistance -DC continuous telemetry and continuous pulse ox to reduce wires attached -AM CBC every other day to reduce total blood volume drawn this admission  Elevated alkaline phosphatase   Liver cirrhosis without ascites Alk phos this morning 171, up from 157 yesterday.  AST 29, ALT 12.  Abdominal ultrasound 2/25 demonstrates cirrhosis without portal hypertension.  - morning CMPs  AKI CKD stage IIIb Creatinine today 1.9, down from 2.27 yesterday.  2.03 on admission, bump due  to diuresis.  Baseline 1.3-1.35. -Continue holding home Entresto -AM CMP's  Thrombocytopenia; recurrent Intermittent during admission.  Platelets 170 this morning. -A.m. CBC every other day to reduce total volume blood drawn this admission  Immobility Pain from bilateral hip osteoarthritis and cervical spine changes contributing. -Up out of bed -Ambulate with assistance -PT/OT  Hypoalbuminemia Albumin 2.8 this morning.  Nutrition consulted, appreciate recommendations.  Hypothyroidism Takes levothyroxine at home.  Admission TSH 2.97. -Continue home levothyroxine  GERD At home takes pantoprazole 40 mg. -Continue during admission -Consider discontinuing on discharge   FEN/GI: Regular diet PPx: heparin subcu   Status is: Inpatient  Remains inpatient appropriate because:Ongoing diagnostic testing needed not appropriate for outpatient work up, IV treatments appropriate due to intensity of illness or inability to take PO and Inpatient level of care appropriate due to severity of illness   Dispo:  Patient From: Home  Planned Disposition: Home  Medically stable for discharge: No      Subjective:  Patient reports doing very well overnight.  Still has some left neck pain, less than admission but still bothersome.  She requests a piece of paper and pen.  Would like to call her daughter but cannot member her phone number.  Provided patient the phone number we have on file for her son.  Objective: Temp:  [97.3 F (36.3 C)-98.2 F (36.8 C)] 97.6 F (36.4 C) (02/27 0743) Pulse Rate:  [72-83] 80 (02/27 0743) Resp:  [14-20] 16 (02/27 0743) BP: (83-106)/(41-69) 105/60 (02/27 0743) SpO2:  [97 %-100 %] 100 % (02/27 0743) Weight:  [53.7 kg] 53.7 kg (02/27 0428)  Physical Exam General: Awake, alert, oriented Cardiovascular: Regular rate and rhythm, S1 and S2 present, no murmurs auscultated Respiratory: Lung  fields clear to auscultation bilaterally Extremities: 2+ pitting  edema BLE, trace pitting edema at level of hip bilaterally  Neuro: Cranial nerves II through X grossly intact, able to move all extremities spontaneously   Laboratory: Recent Labs  Lab 07/07/20 0239 07/08/20 0356 07/09/20 0425  WBC 3.2* 3.0* 4.1  HGB 10.0* 10.2* 11.9*  HCT 32.6* 31.8* 37.1  PLT 145* 153 173   Recent Labs  Lab 07/07/20 0239 07/08/20 0356 07/09/20 0425  NA 144  144 142 145  K 4.7  4.6 4.3 4.1  CL 110  108 107 108  CO2 '23  23 27 26  ' BUN 34*  36* 36* 33*  CREATININE 2.19*  2.16* 2.27* 1.99*  CALCIUM 8.6*  8.7* 8.6* 9.1  PROT 5.6* 5.2* 6.1*  BILITOT 0.9 1.0 1.1  ALKPHOS 148* 157* 171*  ALT '8 8 12  ' AST '20 17 29  ' GLUCOSE 94  99 83 80    Imaging/Diagnostic Tests: ULTRASOUND ABDOMEN LIMITED RIGHT UPPER QUADRANT 07/07/2020 COMPARISON:  None. FINDINGS: Gallbladder: No gallstones or wall thickening visualized. No sonographic Murphy sign noted by sonographer. Common bile duct: Diameter: 9 mm Liver: No focal lesion identified. Nodular contour. Coarsened and heterogeneous parenchymal echogenicity. Portal vein is patent on color Doppler imaging with normal direction of blood flow towards the liver. Other: No ascites identified. IMPRESSION: Cirrhosis with no findings of portal hypertension. Limited evaluation for focal hepatic lesion.  [name], MD 07/09/2020, 7:49 AM PGY-1, [place] Intern pager: [phone], text pages welcome

## 2020-07-10 DIAGNOSIS — I5023 Acute on chronic systolic (congestive) heart failure: Secondary | ICD-10-CM | POA: Diagnosis not present

## 2020-07-10 DIAGNOSIS — M47812 Spondylosis without myelopathy or radiculopathy, cervical region: Secondary | ICD-10-CM | POA: Diagnosis not present

## 2020-07-10 DIAGNOSIS — N184 Chronic kidney disease, stage 4 (severe): Secondary | ICD-10-CM

## 2020-07-10 DIAGNOSIS — M160 Bilateral primary osteoarthritis of hip: Secondary | ICD-10-CM | POA: Diagnosis not present

## 2020-07-10 LAB — COMPREHENSIVE METABOLIC PANEL
ALT: 11 U/L (ref 0–44)
AST: 29 U/L (ref 15–41)
Albumin: 2.7 g/dL — ABNORMAL LOW (ref 3.5–5.0)
Alkaline Phosphatase: 178 U/L — ABNORMAL HIGH (ref 38–126)
Anion gap: 9 (ref 5–15)
BUN: 31 mg/dL — ABNORMAL HIGH (ref 8–23)
CO2: 27 mmol/L (ref 22–32)
Calcium: 9 mg/dL (ref 8.9–10.3)
Chloride: 108 mmol/L (ref 98–111)
Creatinine, Ser: 1.9 mg/dL — ABNORMAL HIGH (ref 0.44–1.00)
GFR, Estimated: 24 mL/min — ABNORMAL LOW (ref >60)
Glucose, Bld: 82 mg/dL (ref 70–99)
Potassium: 4.3 mmol/L (ref 3.5–5.1)
Sodium: 144 mmol/L (ref 135–145)
Total Bilirubin: 1.3 mg/dL — ABNORMAL HIGH (ref 0.3–1.2)
Total Protein: 6 g/dL — ABNORMAL LOW (ref 6.5–8.1)

## 2020-07-10 MED ORDER — FUROSEMIDE 80 MG PO TABS
80.0000 mg | ORAL_TABLET | Freq: Every day | ORAL | Status: DC
  Administered 2020-07-11 – 2020-07-13 (×3): 80 mg via ORAL
  Filled 2020-07-10 (×3): qty 1

## 2020-07-10 NOTE — Progress Notes (Signed)
Physical Therapy Treatment Patient Details Name: Allison Gardner MRN: [id number] DOB: 31-Jan-1923 Today's Date: 07/10/2020    History of Present Illness Pt is a 87 y/o female admitted secondary to L neck pain and SOB. Thought to be secondary to CHF exacerbation. PMH includes CVA with L sided weakness, CHF, CKD, liver cirrhosis, HTN, and memory loss.    PT Comments    Pt received up in chair, female family member present, pt with good participation and tolerance for mobility. Pt able to progress gait distance to 17f using RW and min guard, no LOB. Pt performed standing balance task and transfers with min guard at most, limited carryover of safety cues due to memory deficit. Reviewed supine/seated LE therex, pt would benefit from HEP next session and stair trial prior to discharge. Pt continues to benefit from PT services to progress toward functional mobility goals. Continue to recommend HHPT.   Follow Up Recommendations  Home health PT;Supervision/Assistance - 24 hour     Equipment Recommendations  None recommended by PT    Recommendations for Other Services       Precautions / Restrictions Precautions Precautions: Fall Restrictions Weight Bearing Restrictions: No    Mobility  Bed Mobility               General bed mobility comments: up in chair pre/post    Transfers Overall transfer level: Needs assistance Equipment used: Rolling walker (2 wheeled) Transfers: Sit to/from Stand Sit to Stand: Min guard         General transfer comment: from chair<>RW x4 reps, cues for hand placement needed each time  Ambulation/Gait Ambulation/Gait assistance: Min guard Gait Distance (Feet): 50 Feet (557fin room, seated break, 2529fAssistive device: Rolling walker (2 wheeled) Gait Pattern/deviations: Step-to pattern;Decreased step length - right;Decreased step length - left;Decreased weight shift to left Gait velocity: Decreased   General Gait Details: better at picking  LLE up this date, c/o L shoulder pain but better than previous session, RN gave tylenol/lidocaine patch during session; needs cues for proximity to RW intermittently   Stairs             Wheelchair Mobility    Modified Rankin (Stroke Patients Only)       Balance Overall balance assessment: Needs assistance Sitting-balance support: Feet supported Sitting balance-Leahy Scale: Fair     Standing balance support: Bilateral upper extremity supported;No upper extremity supported Standing balance-Leahy Scale: Fair Standing balance comment: reliant on RW for gait progression due to LLE weakness but able to stand and pull up briefs x2 reps with no LOB and min guard to Supervision                            Cognition Arousal/Alertness: Awake/alert Behavior During Therapy: WFL for tasks assessed/performed Overall Cognitive Status: History of cognitive impairments - at baseline                                 General Comments: to self/situation, good participation and 1-step command following but decreased carryover of safety cues (hands with transfers)      Exercises General Exercises - Lower Extremity Ankle Circles/Pumps: AROM;Both;10 reps;Supine Long Arc Quad: AROM;Both;10 reps;Seated Hip Flexion/Marching: AROM;Both;10 reps;Seated    General Comments General comments (skin integrity, edema, etc.): SpO2 94% on RA after initial gait trial, HR 82 bpm; no dizziness with transfers  Pertinent Vitals/Pain Pain Assessment: Faces Faces Pain Scale: Hurts a little bit Pain Location: L neck Pain Descriptors / Indicators: Aching Pain Intervention(s): Monitored during session;Repositioned;RN gave pain meds during session (RN replaced lidocaine patch to L shoulder/mid-trap)    Home Living                      Prior Function            PT Goals (current goals can now be found in the care plan section) Acute Rehab PT Goals Patient Stated Goal:  to get stronger and go home and to church PT Goal Formulation: With patient Time For Goal Achievement: 07/20/20 Potential to Achieve Goals: Good Progress towards PT goals: Progressing toward goals    Frequency    Min 3X/week      PT Plan Current plan remains appropriate    Co-evaluation              AM-PAC PT "6 Clicks" Mobility   Outcome Measure  Help needed turning from your back to your side while in a flat bed without using bedrails?: None Help needed moving from lying on your back to sitting on the side of a flat bed without using bedrails?: A Little Help needed moving to and from a bed to a chair (including a wheelchair)?: A Little Help needed standing up from a chair using your arms (e.g., wheelchair or bedside chair)?: A Little Help needed to walk in hospital room?: A Little Help needed climbing 3-5 steps with a railing? : A Lot 6 Click Score: 18    End of Session Equipment Utilized During Treatment: Gait belt Activity Tolerance: Patient tolerated treatment well Patient left: in chair;with call bell/phone within reach;with family/visitor present Nurse Communication: Mobility status PT Visit Diagnosis: Unsteadiness on feet (R26.81);Muscle weakness (generalized) (M62.81)     Time: [id number] PT Time Calculation (min) (ACUTE ONLY): 25 min  Charges:  $Gait Training: 8-22 mins $Therapeutic Activity: 8-22 mins                     Allison Macdonell P., PTA Acute Rehabilitation Services Pager: [phone] Office: [place] 07/10/2020, 2:46 PM

## 2020-07-10 NOTE — Plan of Care (Signed)
  Problem: Safety: Goal: Ability to remain free from injury will improve Outcome: Progressing   

## 2020-07-10 NOTE — Plan of Care (Signed)
  Problem: Activity: Goal: Risk for activity intolerance will decrease Outcome: Progressing   Problem: Safety: Goal: Ability to remain free from injury will improve Outcome: Progressing   

## 2020-07-10 NOTE — Progress Notes (Signed)
Occupational Therapy Treatment Patient Details Name: Allison Gardner MRN: [id number] DOB: 12-02-1922 Today's Date: 07/10/2020    History of present illness Pt is a 87 y/o female admitted secondary to L neck pain and SOB. Thought to be secondary to CHF exacerbation. PMH includes CVA with L sided weakness, CHF, CKD, liver cirrhosis, HTN, and memory loss.   OT comments  Pt progressing to OOB ADL and mobility in room with RW. Pt taking a few steps to [facility] and minA for toilet hygiene. Pt's sit to stands from bed <-> BSC <-> recliner with minguardA and increased time. Pt set-upA for grooming in sitting and standing. Pt fatigues quickly with mobility while using RW and requires seated rest breaks. Pt would benefit from continued OT skilled services for ADL, mobility and energy conservation. OT following acutely.    Follow Up Recommendations  Home health OT;Supervision/Assistance - 24 hour    Equipment Recommendations  None recommended by OT    Recommendations for Other Services      Precautions / Restrictions Precautions Precautions: Fall Restrictions Weight Bearing Restrictions: No       Mobility Bed Mobility Overal bed mobility: Needs Assistance Bed Mobility: Supine to Sit     Supine to sit: Mod assist     General bed mobility comments: assist for trunk and BLEs movement    Transfers Overall transfer level: Needs assistance Equipment used: Rolling walker (2 wheeled) Transfers: Sit to/from Stand Sit to Stand: Min guard         General transfer comment: sit to stands from bed <-> BSC <-> recliner with minguardA and increased time.    Balance Overall balance assessment: Needs assistance Sitting-balance support: Feet supported Sitting balance-Leahy Scale: Fair     Standing balance support: Bilateral upper extremity supported;No upper extremity supported Standing balance-Leahy Scale: Fair Standing balance comment: reliant on RW for gait progression due to LLE  weakness but able to stand and pull up briefs x2 reps with no LOB and min guard to Supervision                           ADL either performed or assessed with clinical judgement   ADL Overall ADL's : Needs assistance/impaired     Grooming: Supervision/safety;Standing;Set up;Sitting Grooming Details (indicate cue type and reason): standing and sitting for grooming                 Toilet Transfer: Minimal assistance;Ambulation;RW   Toileting- Clothing Manipulation and Hygiene: Min guard;Sitting/lateral lean;Sit to/from stand Toileting - Water quality scientist Details (indicate cue type and reason): incontinent of urine     Functional mobility during ADLs: Min guard;Rolling walker;Cueing for safety General ADL Comments: Pt limited by fair activity tolerance and decreased strength. Pt willing to participate.     Vision Baseline Vision/History: No visual deficits     Perception     Praxis      Cognition Arousal/Alertness: Awake/alert Behavior During Therapy: WFL for tasks assessed/performed Overall Cognitive Status: History of cognitive impairments - at baseline                                 General Comments: Pt slow to initiate, but following all commands.        Exercises Exercises: General Lower Extremity General Exercises - Lower Extremity Ankle Circles/Pumps: AROM;Both;10 reps;Supine Long Arc Quad: AROM;Both;10 reps;Seated Hip Flexion/Marching: AROM;Both;10 reps;Seated   Shoulder Instructions  General Comments SpO2 94% on RA after mobility HR 82 bpm; no dizziness with transfers    Pertinent Vitals/ Pain       Pain Assessment: Faces Faces Pain Scale: Hurts little more Pain Location: L neck Pain Descriptors / Indicators: Aching (lidocaine patch on it) Pain Intervention(s): Monitored during session  Home Living Family/patient expects to be discharged to:: Private residence Living Arrangements: Children;Other relatives                                       Prior Functioning/Environment Level of Independence: Needs assistance  Gait / Transfers Assistance Needed: ambulates with cane/RW for household distances ADL's / Homemaking Assistance Needed: Daughter helps with tub transfers. bathes and dresses herself, fixes coffee family provides for the rest of her iADLs   Comments: enjoys sewing and cooking   Frequency  Min 2X/week        Progress Toward Goals  OT Goals(current goals can now be found in the care plan section)     Acute Rehab OT Goals Patient Stated Goal: to get stronger and go home and to church OT Goal Formulation: Patient unable to participate in goal setting Time For Goal Achievement: 07/20/20 Potential to Achieve Goals: Good  Plan      Co-evaluation                 AM-PAC OT "6 Clicks" Daily Activity     Outcome Measure   Help from another person eating meals?: None Help from another person taking care of personal grooming?: A Little Help from another person toileting, which includes using toliet, bedpan, or urinal?: A Little Help from another person bathing (including washing, rinsing, drying)?: A Little Help from another person to put on and taking off regular upper body clothing?: A Little Help from another person to put on and taking off regular lower body clothing?: A Little 6 Click Score: 19    End of Session Equipment Utilized During Treatment: Gait belt;Rolling walker  OT Visit Diagnosis: Unsteadiness on feet (R26.81);Other abnormalities of gait and mobility (R26.89);Muscle weakness (generalized) (M62.81);Other symptoms and signs involving cognitive function   Activity Tolerance Patient tolerated treatment well   Patient Left in bed;with call bell/phone within reach   Nurse Communication Mobility status        Time: 1020-1050 OT Time Calculation (min): 30 min  Charges: OT General Charges $OT Visit: 1 Visit OT Treatments $Self  Care/Home Management : 8-22 mins $Therapeutic Activity: 8-22 mins  Jefferey Pica, OTR/L Acute Rehabilitation Services Pager: [phone] Office: [phone]    Allison Gardner 07/10/2020, 5:29 PM

## 2020-07-10 NOTE — Progress Notes (Signed)
Family Medicine Teaching Service Daily Progress Note Intern Pager: [phone]  Patient name: Allison Gardner Medical record number: [id number] Date of birth: 07-20-1922 Age: 87 y.o. Gender: female  Primary Care Provider: Andres Shad, MD Consultants: None Code Status: Full  Pt Overview and Major Events to Date:  2/23 admitted  Assessment and Plan: Ms. Arif is a 87 year old female presenting with acute CHF exacerbation and acute on chronic left neck pain. PMH is significant forCHF (systolic and diastolic), CKD stage IIIb, liver cirrhosis without ascites, HTN, GERD, hypothyroidism, memory loss,h/oGI bleed, HLD, PAD, thrombocytopenia, pressure sores.  Acute on chronic systolic (congestive) heart failure Weight this morning 52.7 kg, down from 53.7 kg yesterday.  Net I's and O's -[id number] last 24 hours.  Dry weight 43 kg.   -Continue IV Lasix 40 mg daily through end of day - Switch to PO lasix 80 mg tomorrow morning - Likely discharge 3/2 with close outpatient follow up -Daily net I's and O's goal >1 L - continue home carvedilol -Hold home Entresto -Vitals and pulse ox.  Routine -Daily weights -Strict I's and O's  Left neck and chest painHerniated disk cervical spine Mild pain today, much improved since admission. Declines additional pain medication today.  -Up out of bed -Ambulate with assistance -Oxycodone 2.5 mg as needed for pain -Heat, ice, Voltaren gel  Mild cognitive impairment Per patient, still performing many ADLs at home.  -Delirium precautions -Up out of bed, ambulate with assistance -DC continuous telemetry and continuous pulse ox to reduce wires attached -AM CBC every other day to reduce total blood volume drawn this admission  Elevated alkaline phosphatase  Liver cirrhosis without ascites Alkaline phos continues to rise, 178 this morning up from 171 yesterday.  AST 29, ALT 11.  Abdominal US 2/25 shows cirrhosis without portal  hypertension. - morning CMPs - recommend outpatient follow up  AKI CKD stage IIIb Creatinine improving, 1.90 today down from 1.99 yesterday.  2.03 on admission.  Baseline 1.3-1.35. -Hold home Entresto -A.m. CMP's  Thrombocytopenia; recurrent Intermittent during admission.  Platelets normal today at 173. -A.m. CBC every other day (reduced total blood volume drawn this admission)  Immobility Likely primarily due to bilateral hip osteoarthritis and cervical spine changes. -Up out of bed -Ambulate with assistance -PT/OT  Protein calorie malnutrition  Hypoalbuminemia Albumin 2.7 this morning, stable from 2.8 yesterday. -Nutrition consulted, recommend boost breeze 3 times daily, MVI with minerals daily  Hypothyroidism Takes levothyroxine at home.  Admission TSH 2.97. -Continue home levothyroxine  GERD At home takes pantoprazole 40 mg. -Continue during admission -Consider discontinuing on discharge  FEN/GI: regular diet PPx: lasix subcu   Status is: Inpatient  Remains inpatient appropriate because:IV treatments appropriate due to intensity of illness or inability to take PO and Inpatient level of care appropriate due to severity of illness   Dispo:  Patient From: Home  Planned Disposition: Home  Medically stable for discharge: No      Subjective:  Patient found laying comfortably in bed. Has continued mild neck pain with rotation. Much improved from admission, declines additional pain medication. Would like to get out of bed more often. Wanting to know when she can go home.    Objective: Temp:  [97.8 F (36.6 C)-98.2 F (36.8 C)] 98.2 F (36.8 C) (02/28 1139) Pulse Rate:  [75-83] 75 (02/28 1139) Resp:  [16-20] 16 (02/28 1139) BP: (96-107)/(54-57) 96/56 (02/28 1139) SpO2:  [100 %] 100 % (02/28 1139) Weight:  [52.7 kg] 52.7 kg (02/28 0435)  Physical  Exam General: Awake, alert, no acute distress Cardiovascular: Regular rate and rhythm, S1 and S2 present,  no murmurs auscultated Respiratory: Lung fields clear to auscultation bilaterally Extremities: 1+ pitting BLE edema of ankles, trace pitting edema at level of hip bilaterally (all edema much improved from admission), palpable pedal and pretibial pulses bilaterally Neuro: Cranial nerves II through X grossly intact, able to move all extremities spontaneously  Laboratory: Recent Labs  Lab 07/07/20 0239 07/08/20 0356 07/09/20 0425  WBC 3.2* 3.0* 4.1  HGB 10.0* 10.2* 11.9*  HCT 32.6* 31.8* 37.1  PLT 145* 153 173   Recent Labs  Lab 07/08/20 0356 07/09/20 0425 07/10/20 0251  NA 142 145 144  K 4.3 4.1 4.3  CL 107 108 108  CO2 '27 26 27  '$ BUN 36* 33* 31*  CREATININE 2.27* 1.99* 1.90*  CALCIUM 8.6* 9.1 9.0  PROT 5.2* 6.1* 6.0*  BILITOT 1.0 1.1 1.3*  ALKPHOS 157* 171* 178*  ALT '8 12 11  '$ AST '17 29 29  '$ GLUCOSE 83 80 82    Imaging/Diagnostic Tests: None last 24 hours.   [name], MD 07/10/2020, 11:57 AM PGY-1, [place] Intern pager: [phone], text pages welcome

## 2020-07-11 DIAGNOSIS — I5022 Chronic systolic (congestive) heart failure: Secondary | ICD-10-CM | POA: Diagnosis not present

## 2020-07-11 DIAGNOSIS — R6889 Other general symptoms and signs: Secondary | ICD-10-CM | POA: Diagnosis not present

## 2020-07-11 DIAGNOSIS — K761 Chronic passive congestion of liver: Secondary | ICD-10-CM | POA: Diagnosis not present

## 2020-07-11 DIAGNOSIS — I5023 Acute on chronic systolic (congestive) heart failure: Secondary | ICD-10-CM | POA: Diagnosis not present

## 2020-07-11 LAB — COMPREHENSIVE METABOLIC PANEL
ALT: 12 U/L (ref 0–44)
AST: 32 U/L (ref 15–41)
Albumin: 2.6 g/dL — ABNORMAL LOW (ref 3.5–5.0)
Alkaline Phosphatase: 181 U/L — ABNORMAL HIGH (ref 38–126)
Anion gap: 8 (ref 5–15)
BUN: 30 mg/dL — ABNORMAL HIGH (ref 8–23)
CO2: 29 mmol/L (ref 22–32)
Calcium: 8.9 mg/dL (ref 8.9–10.3)
Chloride: 106 mmol/L (ref 98–111)
Creatinine, Ser: 1.76 mg/dL — ABNORMAL HIGH (ref 0.44–1.00)
GFR, Estimated: 26 mL/min — ABNORMAL LOW (ref >60)
Glucose, Bld: 86 mg/dL (ref 70–99)
Potassium: 4.1 mmol/L (ref 3.5–5.1)
Sodium: 143 mmol/L (ref 135–145)
Total Bilirubin: 1.2 mg/dL (ref 0.3–1.2)
Total Protein: 5.4 g/dL — ABNORMAL LOW (ref 6.5–8.1)

## 2020-07-11 LAB — CBC
HCT: 29.3 % — ABNORMAL LOW (ref 36.0–46.0)
Hemoglobin: 9.8 g/dL — ABNORMAL LOW (ref 12.0–15.0)
MCH: 30.4 pg (ref 26.0–34.0)
MCHC: 33.4 g/dL (ref 30.0–36.0)
MCV: 91 fL (ref 80.0–100.0)
Platelets: 143 10*3/uL — ABNORMAL LOW (ref 150–400)
RBC: 3.22 MIL/uL — ABNORMAL LOW (ref 3.87–5.11)
RDW: 18 % — ABNORMAL HIGH (ref 11.5–15.5)
WBC: 3.7 10*3/uL — ABNORMAL LOW (ref 4.0–10.5)
nRBC: 0 % (ref 0.0–0.2)

## 2020-07-11 NOTE — Progress Notes (Addendum)
Family Medicine Teaching Service Daily Progress Note Intern Pager: [phone]  Patient name: Allison Gardner Medical record number: [id number] Date of birth: [birth year] Age: 87 y.o. Gender: female  Primary Care Provider: Andres Shad, MD Consultants: None Code Status: Full  Pt Overview and Major Events to Date:  2/23 - Admitted  Assessment and Plan: [name] is a 87 year old female presenting with acute CHF exacerbation and acute on chronic left neck pain.PMH is significant forCHF (systolic and diastolic), CKD stage IIIb, liver cirrhosis without ascites, HTN, GERD, hypothyroidism, memory loss,h/oGI bleed, HLD, PAD, thrombocytopenia, pressure sores.  Acute on chronic systolic heart failure Weight this morning 51.8kg, Net output last 24h 1159m (total of 67856msince admission). Dry weight 48kg. - PO lasix 80 mg started today - Daily net I's and O's goal >1 L - Continue home carvedilol - Hold home Entresto - Vitals and pulse ox.  Routine - Daily weights - Strict I's and O's  Chronic kidney disease, stage 4 Cr 1.76 today. Baseline 1.3-1.35. - Scr stable in the setting of active diuresis - Hold home Entresto - Daily CMP's  Left neck and chest painHerniated disk cervical spine -Up out of bed; Ambulate with assistance -Oxycodone 2.5 mg as needed for pain -Heat, ice, Voltaren gel  Mild cognitive impairment Per patient, still performing many ADLs at home.  -Delirium precautions -Up out of bed, ambulate with assistance -AM CBC every other day to reduce total blood volume drawn this admission  Thrombocytopenia; recurrent Intermittent during admission. Platelet trend [id number] A.m. CBC every other day (reduced total blood volume drawn this admission)  Decreased mobility Likely primarily due to bilateral hip osteoarthritis and cervical spine changes. -Up out of bed; Ambulate with assistance -PT/OT  Protein calorie malnutrition   Hypoalbuminemia Albumin 2.6 this morning, stable from 2.7 yesterday. -Nutrition consulted, recommend boost breeze 3 times daily, MVI with minerals daily  Elevated alkaline phosphatase  Liver cirrhosis without ascites Alk phos 171>178>181.  AST 32, ALT 12.  Abdominal [place]/25 shows cirrhosis without portal hypertension. - Morning CMPs - Recommend outpatient follow up  Hypothyroidism -Continue home levothyroxine  GERD - Continue home pantoprazole (consider discontinuation on discharge)   FEN/GI: Regular PPx: Lovenox subQ   Status is: Inpatient  Remains inpatient appropriate because:Inpatient level of care appropriate due to severity of illness   Dispo:  Patient From: Home  Planned Disposition: Home  Medically stable for discharge: No      Subjective:  Patient reports that she is doing okay this morning, she is still having some leg soreness but describes it as very mild. Reports no chest pain or difficulty breathing.  Objective: Temp:  [97.8 F (36.6 C)-98.2 F (36.8 C)] 97.8 F (36.6 C) (03/01 0432) Pulse Rate:  [75-87] 78 (03/01 0432) Resp:  [15-16] 15 (03/01 0432) BP: (96-109)/(54-58) 104/58 (03/01 0432) SpO2:  [97 %-100 %] 97 % (03/01 0432) Weight:  [51.8 kg] 51.8 kg (03/01 0432) Physical Exam: General: NAD, sitting up in chair next to bed with breakfast tray Cardiovascular: RRR, no murmur appreciated Respiratory: CTAB, no increased WOB Abdomen: soft, non-tender, non-distended Extremities: 1+ pitting BLE edema of ankles up to shin with trace edema at knees. Pressure bandages on b/l heels  Laboratory: Recent Labs  Lab 07/08/20 0356 07/09/20 0425 07/11/20 0031  WBC 3.0* 4.1 3.7*  HGB 10.2* 11.9* 9.8*  HCT 31.8* 37.1 29.3*  PLT 153 173 143*   Recent Labs  Lab 07/09/20 0425 07/10/20 0251 07/11/20 0031  NA 145 144 143  K 4.1 4.3 4.1  CL 108 108 106  CO2 '26 27 29  ' BUN 33* 31* 30*  CREATININE 1.99* 1.90* 1.76*  CALCIUM 9.1 9.0 8.9  PROT 6.1*  6.0* 5.4*  BILITOT 1.1 1.3* 1.2  ALKPHOS 171* 178* 181*  ALT '12 11 12  ' AST 29 29 32  GLUCOSE 80 82 86     Imaging/Diagnostic Tests: No results found.  Rise Patience, DO 07/11/2020, 9:46 AM PGY-1, [place] Intern pager: [phone], text pages welcome

## 2020-07-11 NOTE — Plan of Care (Signed)
  Problem: Clinical Measurements: Goal: Ability to maintain clinical measurements within normal limits will improve Outcome: Progressing   Problem: Activity: Goal: Risk for activity intolerance will decrease Outcome: Progressing   

## 2020-07-11 NOTE — TOC Progression Note (Addendum)
Transition of Care [facility]) - Progression Note    Patient Details  Name: AKYA RITTINGER MRN: [id number] Date of Birth: 1922-07-05  Transition of Care [facility]) CM/SW Contact  Zenon Mayo, RN Phone Number: 07/11/2020, 11:25 AM  Clinical Narrative:    NCM spoke with patient at the bedside, she states she lives with her son.  She has a cane and walker at home, a scale and blood pressure cuff.  She states her daughter is always visiting her also. NCM offered choice,   She gave this NCM permission to call her daughters.  NCM contacted Ruby, she states patient will need to be able to ambulate good enough to get her self to bathroom and do other things before she comes home.  NCM informed her she walked 50 feet with walker with physical therapy.  Ruby could not remember the name of the home health agency patient had previously, she states to call [place].  This NCM contacted [place], she states it was [facility] and she would like to use them again. NCM made referral to [facility] with [facility] for [place], [place], [place].  She states to fax the information over to [phone].  NCM faxed the information over to [facility]. Awaiting to hear back.        Expected Discharge Plan and Services                                                 Social Determinants of Health (SDOH) Interventions    Readmission Risk Interventions No flowsheet data found.

## 2020-07-11 NOTE — Progress Notes (Signed)
Physical Therapy Treatment Patient Details Name: Allison Gardner MRN: [id number] DOB: 1922/07/05 Today's Date: 07/11/2020    History of Present Illness Pt is a 87 y/o female admitted secondary to L neck pain and SOB. Thought to be secondary to CHF exacerbation. PMH includes CVA with L sided weakness, CHF, CKD, liver cirrhosis, HTN, and memory loss.    PT Comments    Pt received in supine, agreeable to therapy session and with good participation and improved activity tolerance this date. Pt progressed to hallway distance gait trial (~169f) using RW and mostly Supervision, needs cues to pick up LLE and could benefit from L AFO due to L foot drop (chronic, pt likely has brace at home?). Pt requiring decreased assist for bed mobility but utilized bed rail for assist, able to bridge hips in bed while repositioning. Pt continues to benefit from PT services to progress toward functional mobility goals. Continue to recommend HHPT.  Follow Up Recommendations  Home health PT;Supervision/Assistance - 24 hour     Equipment Recommendations  None recommended by PT    Recommendations for Other Services       Precautions / Restrictions Precautions Precautions: Fall Restrictions Weight Bearing Restrictions: No    Mobility  Bed Mobility Overal bed mobility: Needs Assistance Bed Mobility: Sit to Supine;Rolling;Sidelying to Sit Rolling: Modified independent (Device/Increase time) Sidelying to sit: Min guard   Sit to supine: Min guard   General bed mobility comments: use of bed rail, HOB flat    Transfers Overall transfer level: Needs assistance Equipment used: Rolling walker (2 wheeled) Transfers: Sit to/from Stand Sit to Stand: Min guard         General transfer comment: sit to stand from bed and chair heights to RW  Ambulation/Gait Ambulation/Gait assistance: Supervision;Min guard Gait Distance (Feet): 145 Feet Assistive device: Rolling walker (2 wheeled) Gait  Pattern/deviations: Step-to pattern;Decreased step length - right;Decreased step length - left;Decreased weight shift to left Gait velocity: Decreased   General Gait Details: needs cues as she fatigues to pick LLE up more but could benefit from AFO due to L foot drop; c/o shoulder pain but good RW management, occasional min guard for posture/RW use but mostly supervision; chair follow for safety but no LOB   Stairs             Wheelchair Mobility    Modified Rankin (Stroke Patients Only)       Balance Overall balance assessment: Needs assistance Sitting-balance support: Feet supported Sitting balance-Leahy Scale: Fair     Standing balance support: Bilateral upper extremity supported;No upper extremity supported Standing balance-Leahy Scale: Fair Standing balance comment: reliant on RW for gait progression due to LLE weakness                            Cognition Arousal/Alertness: Awake/alert Behavior During Therapy: WFL for tasks assessed/performed Overall Cognitive Status: History of cognitive impairments - at baseline                                 General Comments: Pt slow to initiate, but following all commands.      Exercises      General Comments General comments (skin integrity, edema, etc.): poor pleth reading on pulse oximeter during gait trial but no DOE and VSS pre/post ambulation on RA; briefs donned for mobility and remained on with purewick in place at end of  session      Pertinent Vitals/Pain Pain Assessment: Faces Faces Pain Scale: Hurts little more Pain Location: L neck Pain Descriptors / Indicators: Aching (lidocaine patch on it placed prior to OOB) Pain Intervention(s): Monitored during session;Repositioned;RN gave pain meds during session (lidocaine patch placed by RN)    Home Living                      Prior Function            PT Goals (current goals can now be found in the care plan section) Acute  Rehab PT Goals Patient Stated Goal: to get stronger and go home and to church PT Goal Formulation: With patient Time For Goal Achievement: 07/20/20 Potential to Achieve Goals: Good Progress towards PT goals: Progressing toward goals    Frequency    Min 3X/week      PT Plan Current plan remains appropriate    Co-evaluation              AM-PAC PT "6 Clicks" Mobility   Outcome Measure  Help needed turning from your back to your side while in a flat bed without using bedrails?: None Help needed moving from lying on your back to sitting on the side of a flat bed without using bedrails?: A Little Help needed moving to and from a bed to a chair (including a wheelchair)?: A Little Help needed standing up from a chair using your arms (e.g., wheelchair or bedside chair)?: A Little Help needed to walk in hospital room?: A Little Help needed climbing 3-5 steps with a railing? : A Little 6 Click Score: 19    End of Session Equipment Utilized During Treatment: Gait belt Activity Tolerance: Patient tolerated treatment well Patient left: in bed;with call bell/phone within reach;with bed alarm set (purewick in place) Nurse Communication: Mobility status PT Visit Diagnosis: Unsteadiness on feet (R26.81);Muscle weakness (generalized) (M62.81)     Time: [id number] PT Time Calculation (min) (ACUTE ONLY): 24 min  Charges:  $Gait Training: 8-22 mins $Therapeutic Activity: 8-22 mins                     Kyran Connaughton P., PTA Acute Rehabilitation Services Pager: [phone] Office: [place] 07/11/2020, 2:53 PM

## 2020-07-11 NOTE — Plan of Care (Signed)
  Problem: Clinical Measurements: Goal: Ability to maintain clinical measurements within normal limits will improve Outcome: Progressing   Problem: Clinical Measurements: Goal: Will remain free from infection Outcome: Progressing   

## 2020-07-11 NOTE — Care Management Important Message (Signed)
Important Message  Patient Details  Name: Allison Gardner MRN: [id number] Date of Birth: 1923/04/12   Medicare Important Message Given:  Yes     Shelda Altes 07/11/2020, 12:04 PM

## 2020-07-11 NOTE — TOC Transition Note (Addendum)
Transition of Care [facility]) - CM/SW Discharge Note   Patient Details  Name: Allison Gardner MRN: [id number] Date of Birth: 1923-02-12  Transition of Care [facility]) CM/SW Contact:  Zenon Mayo, RN Phone Number: 07/11/2020, 11:31 AM   Clinical Narrative:    NCM spoke with patient at the bedside, she states she lives with her son.  She has a cane and walker at home, a scale and blood pressure cuff.  She states her daughter is always visiting her also. NCM offered choice,   She gave this NCM permission to call her daughters.  NCM contacted Ruby, she states patient will need to be able to ambulate good enough to get her self to bathroom and do other things before she comes home.  NCM informed her she walked 50 feet with walker with physical therapy.  Ruby could not remember the name of the home health agency patient had previously, she states to call [place].  This NCM contacted [place], she states it was [facility] and she would like to use them again. NCM made referral to [facility] with [facility] for [place], [place], [place].  She states to fax the information over to [phone].  NCM faxed the information over to [facility]. Awaiting to hear back. [facility] is able to take referral , and the soc will begin on Thursday.     Final next level of care: [place] Barriers to Discharge: Continued Medical Work up   Patient Goals and CMS Choice Patient states their goals for this hospitalization and ongoing recovery are:: to do better CMS Medicare.gov Compare Post Acute Care list provided to:: Patient Represenative (must comment) Choice offered to / list presented to : Adult Children  Discharge Placement                       Discharge Plan and Services                  DME Agency: NA       HH Arranged: RN,Disease Management,PT,OT [place] Agency: [place] Date [facility] Agency Contacted: 07/11/20 Time [place]:  [place] Representative spoke with at [place]: [place] ([place]) Interventions     Readmission Risk Interventions No flowsheet data found.

## 2020-07-12 ENCOUNTER — Other Ambulatory Visit: Payer: Self-pay | Admitting: Family Medicine

## 2020-07-12 DIAGNOSIS — R419 Unspecified symptoms and signs involving cognitive functions and awareness: Secondary | ICD-10-CM | POA: Diagnosis not present

## 2020-07-12 DIAGNOSIS — K761 Chronic passive congestion of liver: Secondary | ICD-10-CM | POA: Diagnosis not present

## 2020-07-12 DIAGNOSIS — I5023 Acute on chronic systolic (congestive) heart failure: Secondary | ICD-10-CM | POA: Diagnosis not present

## 2020-07-12 DIAGNOSIS — R6889 Other general symptoms and signs: Secondary | ICD-10-CM | POA: Diagnosis not present

## 2020-07-12 LAB — GLUCOSE, CAPILLARY: Glucose-Capillary: 80 mg/dL (ref 70–99)

## 2020-07-12 LAB — BASIC METABOLIC PANEL
Anion gap: 8 (ref 5–15)
BUN: 28 mg/dL — ABNORMAL HIGH (ref 8–23)
CO2: 29 mmol/L (ref 22–32)
Calcium: 9 mg/dL (ref 8.9–10.3)
Chloride: 105 mmol/L (ref 98–111)
Creatinine, Ser: 1.56 mg/dL — ABNORMAL HIGH (ref 0.44–1.00)
GFR, Estimated: 30 mL/min — ABNORMAL LOW (ref >60)
Glucose, Bld: 79 mg/dL (ref 70–99)
Potassium: 4 mmol/L (ref 3.5–5.1)
Sodium: 142 mmol/L (ref 135–145)

## 2020-07-12 MED ORDER — FUROSEMIDE 80 MG PO TABS: 80.0000 mg | ORAL_TABLET | Freq: Two times a day (BID) | ORAL | 0 refills | Status: DC

## 2020-07-12 MED FILL — FUROSEMIDE 80 MG TAB: 80 | 30 days supply | Qty: 60 | Fill #0

## 2020-07-12 NOTE — Progress Notes (Signed)
Called RN to confirm that patient son was unable to collect this today as he had a spinal injection and is unable to drive for 24 hours.  He will contact you tomorrow.  Discussed with nurse to facilitate an early morning discharge if patient wishes.  RN will get back to me regarding this.  Lattie Haw MD PGY-2, [place] [facility]

## 2020-07-12 NOTE — Progress Notes (Signed)
  Mobility Specialist Criteria Algorithm Info.  Mobility Team: HOB elevated:Other (Comment) (up to chair) Activity: Ambulated in hall; Dangled on edge of bed Range of motion: Active; All extremities Level of assistance: Contact guard assist, steadying assist Assistive device: Front wheel walker Minutes sitting in chair:  Minutes stood: 5 minutes Minutes ambulated: 5 minutes Distance ambulated (ft): 100 ft Mobility response: Tolerated well Bed Position: Semi-fowlers  Pt received dangling EOB asleep with her head supported on bedside table. Agreed to participate in mobility with max encouragement. Stood from the EOB with supervision and ambulated in hallway 100 feet at min guard with steady gait using RW. Declined to sit in recliner chair upon returning to room. Tolerated ambulation well without incident and only complained of shoulder pain. Is now lying supine bed with all needs met.  07/12/2020 2:47 PM

## 2020-07-12 NOTE — Progress Notes (Signed)
Family Medicine Teaching Service Daily Progress Note Intern Pager: [phone]  Patient name: Allison Gardner Medical record number: [id number] Date of birth: [birth year] Age: 87 y.o. Gender: female  Primary Care Provider: Andres Shad, MD Consultants: None Code Status: Full  Pt Overview and Major Events to Date:  2/23 - Admitted  Assessment and Plan: [name] is a 87 year old female presenting with acute CHF exacerbation and acute on chronic left neck pain.PMH is significant forCHF (systolic and diastolic), CKD stage IIIb, liver cirrhosis without ascites, HTN, GERD, hypothyroidism, memory loss,h/oGI bleed, HLD, PAD, thrombocytopenia, pressure sores.  Acute on chronic systolic heart failure Weight this morning 51.2kg, Net output last 24h 1241m with an unmeasured output as well. Dry weight 48kg. - PO Lasix '80mg'$  BID - Daily net I's and O's goal >1 L - Continue home carvedilol - Hold home Entresto - Vitals and pulse ox.  Routine - Daily weights - Strict I's and O's  Chronic kidney disease, stage 4 Cr 1.56 today. Baseline 1.3-1.35. Scr stable in the setting of active diuresis - Restart home Entresto at discharge - Daily CMP's  Left neck and chest painHerniated disk cervical spine -Up out of bed; Ambulate with assistance -Oxycodone 2.5 mg as needed for pain -Heat, ice, Voltaren gel  Mild cognitive impairment Per patient, still performing many ADLs at home.  -Delirium precautions -Up out of bed, ambulate with assistance -AM CBC every other day to reduce total blood volume drawn this admission  Thrombocytopenia; recurrent Intermittent during admission. Platelet trend [id number] A.m. CBC every other day (reduced total blood volume drawn this admission)  Decreased mobility Likely primarily due to bilateral hip osteoarthritis and cervical spine changes. -Up out of bed; Ambulate with assistance -PT/OT  Protein calorie malnutrition   Hypoalbuminemia -Nutrition consulted, recommend boost breeze 3 times daily, MVI with minerals daily  Elevated alkaline phosphatase  Liver cirrhosis without ascites Abdominal [place]/25 shows cirrhosis without portal hypertension. - Morning CMPs - Recommend outpatient follow up  Hypothyroidism -Continue home levothyroxine  GERD - Continue home pantoprazole (consider discontinuation on discharge)   FEN/GI: Regular PPx: Lovenox subQ   Status is: Inpatient  Remains inpatient appropriate because:Unsafe d/c plan   Dispo:  Patient From: Home  Planned Disposition: Home  Medically stable for discharge: Yes      Subjective:  Patient reports that her neck pain is doing better since she has been admitted. Has no respiratory complaints and no chest pain  Objective: Temp:  [97.7 F (36.5 C)-98 F (36.7 C)] 97.8 F (36.6 C) (03/02 1112) Pulse Rate:  [74-84] 74 (03/02 1112) Resp:  [16-18] 17 (03/02 1112) BP: (104-112)/(49-58) 112/58 (03/02 1112) SpO2:  [98 %-100 %] 100 % (03/02 1112) Weight:  [51.2 kg] 51.2 kg (03/02 0347) Physical Exam: General: NAD, sitting up in bed with breakfast tray, well-appearing Cardiovascular: RRR, no murmur appreciated Respiratory: comfortable on room air, no increased WOB, minimal rales in b/l lung bases. Abdomen: soft, non-tender, non-distended Extremities: 1+ pitting BLE edema of ankles up to shin with trace edema at knees.  Laboratory: Recent Labs  Lab 07/08/20 0356 07/09/20 0425 07/11/20 0031  WBC 3.0* 4.1 3.7*  HGB 10.2* 11.9* 9.8*  HCT 31.8* 37.1 29.3*  PLT 153 173 143*   Recent Labs  Lab 07/09/20 0425 07/10/20 0251 07/11/20 0031 07/12/20 0419  NA 145 144 143 142  K 4.1 4.3 4.1 4.0  CL 108 108 106 105  CO2 '26 27 29 29  '$ BUN 33* 31* 30* 28*  CREATININE  1.99* 1.90* 1.76* 1.56*  CALCIUM 9.1 9.0 8.9 9.0  PROT 6.1* 6.0* 5.4*  --   BILITOT 1.1 1.3* 1.2  --   ALKPHOS 171* 178* 181*  --   ALT '12 11 12  '$ --   AST 29 29 32  --    GLUCOSE 80 82 86 79     Imaging/Diagnostic Tests: No results found.  Rise Patience, DO 07/12/2020, 1:33 PM PGY-1, [place] Intern pager: [phone], text pages welcome

## 2020-07-12 NOTE — Discharge Instructions (Signed)
You were hospitalized due to increased shortness of breath with concern for worsening of your heart failure (aka fluid overload). While you were admitted you were given IV lasix and you are being discharged with Lasix '80mg'$ , to be taken twice daily. If you start to have worsening of your breathing again, increasing swelling in your legs, or chest pains please either call the office to be seen or go to the ER for evaluation. Please make sure to follow-up with your primary care physician to monitor your kidney function while you are on this much Lasix.

## 2020-07-12 NOTE — Progress Notes (Signed)
Physical Therapy Treatment Patient Details Name: Allison Gardner MRN: [id number] DOB: 19-Apr-1923 Today's Date: 07/12/2020    History of Present Illness Pt is a 87 y/o female admitted secondary to L neck pain and SOB. Thought to be secondary to CHF exacerbation. PMH includes CVA with L sided weakness, CHF, CKD, liver cirrhosis, HTN, and memory loss.    PT Comments    Pt received in supine, agreeable to therapy session with encouragement and with good participation and tolerance for session. Pt able to perform stair trial x3 steps with minA and no LOB then gait trial 152f without needing seated break and mostly Supervision with RW. Pt requesting to return to bed and reports 6/10 modified RPE (Fatigue) at end of session. VSS on RA during gait/stair trial. Pt continues to benefit from PT services to progress toward functional mobility goals. Continue to recommend HHPT.   Follow Up Recommendations  Home health PT;Supervision/Assistance - 24 hour     Equipment Recommendations  None recommended by PT    Recommendations for Other Services       Precautions / Restrictions Precautions Precautions: Fall Restrictions Weight Bearing Restrictions: No    Mobility  Bed Mobility Overal bed mobility: Needs Assistance Bed Mobility: Sit to Supine;Rolling;Sidelying to Sit Rolling: Modified independent (Device/Increase time) Sidelying to sit: Modified independent (Device/Increase time)   Sit to supine: Modified independent (Device/Increase time)   General bed mobility comments: use of bed rail, HOB flat    Transfers Overall transfer level: Needs assistance Equipment used: Rolling walker (2 wheeled) Transfers: Sit to/from Stand Sit to Stand: Min guard         General transfer comment: sit to stand from bed and chair heights to RW  Ambulation/Gait Ambulation/Gait assistance: Supervision;Min guard Gait Distance (Feet): 120 Feet Assistive device: Rolling walker (2 wheeled) Gait  Pattern/deviations: Step-to pattern;Decreased step length - right;Decreased step length - left;Decreased weight shift to left Gait velocity: Decreased   General Gait Details: needs cues as she fatigues to pick LLE up more and could benefit from AFO due to L foot drop; c/o shoulder pain but good RW management, occasional min guard for posture/RW use but mostly supervision; chair follow for safety but no LOB   Stairs Stairs: Yes Stairs assistance: Min assist Stair Management: One rail Left;Step to pattern;Forwards (HHA on R hand) Number of Stairs: 3 General stair comments: cues for sequencing due to LLE weakness, pt performed with no LOB, needs minA support and step-to gait pattern for safety; VSS   Wheelchair Mobility    Modified Rankin (Stroke Patients Only)       Balance Overall balance assessment: Needs assistance Sitting-balance support: Feet supported Sitting balance-Leahy Scale: Fair     Standing balance support: Bilateral upper extremity supported;No upper extremity supported Standing balance-Leahy Scale: Fair Standing balance comment: reliant on RW for gait progression due to LLE weakness but able to stand and pull up briefs with no UE support no LOB                            Cognition Arousal/Alertness: Awake/alert Behavior During Therapy: WFL for tasks assessed/performed Overall Cognitive Status: History of cognitive impairments - at baseline                                 General Comments: Pt slow to initiate, but following all commands.      Exercises  General Comments General comments (skin integrity, edema, etc.): SpO2 100% on RA and HR to 105 after stair trial      Pertinent Vitals/Pain Pain Assessment: Faces Faces Pain Scale: Hurts a little bit Pain Location: L neck Pain Descriptors / Indicators: Aching (lidocaine patch on it placed prior to OOB) Pain Intervention(s): Monitored during session;Repositioned (pt has  lidocaine patch to sore area)    Home Living                      Prior Function            PT Goals (current goals can now be found in the care plan section) Acute Rehab PT Goals Patient Stated Goal: to get stronger and go home and to church PT Goal Formulation: With patient Time For Goal Achievement: 07/20/20 Potential to Achieve Goals: Good Progress towards PT goals: Progressing toward goals    Frequency    Min 3X/week      PT Plan Current plan remains appropriate    Co-evaluation              AM-PAC PT "6 Clicks" Mobility   Outcome Measure  Help needed turning from your back to your side while in a flat bed without using bedrails?: None Help needed moving from lying on your back to sitting on the side of a flat bed without using bedrails?: A Little Help needed moving to and from a bed to a chair (including a wheelchair)?: A Little Help needed standing up from a chair using your arms (e.g., wheelchair or bedside chair)?: A Little Help needed to walk in hospital room?: A Little Help needed climbing 3-5 steps with a railing? : A Little 6 Click Score: 19    End of Session Equipment Utilized During Treatment: Gait belt Activity Tolerance: Patient tolerated treatment well Patient left: in bed;with call bell/phone within reach;with bed alarm set (purewick in place) Nurse Communication: Mobility status PT Visit Diagnosis: Unsteadiness on feet (R26.81);Muscle weakness (generalized) (M62.81)     Time: [id number] PT Time Calculation (min) (ACUTE ONLY): 33 min  Charges:  $Gait Training: 8-22 mins $Therapeutic Activity: 8-22 mins                     Cline Draheim P., PTA Acute Rehabilitation Services Pager: [phone] Office: [place] 07/12/2020, 4:45 PM

## 2020-07-12 NOTE — Plan of Care (Signed)
  Problem: Acute Rehab PT Goals(only PT should resolve) Goal: Pt Will Go Supine/Side To Sit Outcome: Adequate for Discharge Goal: Pt Will Go Sit To Supine/Side Outcome: Adequate for Discharge Goal: Patient Will Transfer Sit To/From Stand Outcome: Adequate for Discharge Goal: Pt Will Ambulate Outcome: Adequate for Discharge Goal: Pt Will Go Up/Down Stairs Outcome: Adequate for Discharge
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
## 2020-07-12 NOTE — Progress Notes (Signed)
Daughter, Allison Gardner, at bedside with pt states pt cannot D/C today due to primary caregiver in [place] for medical procedure and will not be able to drive to pick pt up and then tend to her at home. Family requesting pt to remain at hospital overnight and be released in AM. MD notified and stated if there was no possible was for family to get pt home safely tonight, she could stay overnight for safe D/C plan in AM. Family informed at bedside. Still awaiting return call from primary caregiver, [name].

## 2020-07-12 NOTE — Progress Notes (Signed)
D/C instructions given and reviewed. Pt requested son be called and instructions reviewed with him. Left voicemail to return call. IV removed, tolerated well.

## 2020-07-13 LAB — CBC
HCT: 31.6 % — ABNORMAL LOW (ref 36.0–46.0)
Hemoglobin: 10.2 g/dL — ABNORMAL LOW (ref 12.0–15.0)
MCH: 29.6 pg (ref 26.0–34.0)
MCHC: 32.3 g/dL (ref 30.0–36.0)
MCV: 91.6 fL (ref 80.0–100.0)
Platelets: 144 10*3/uL — ABNORMAL LOW (ref 150–400)
RBC: 3.45 MIL/uL — ABNORMAL LOW (ref 3.87–5.11)
RDW: 18 % — ABNORMAL HIGH (ref 11.5–15.5)
WBC: 3.7 10*3/uL — ABNORMAL LOW (ref 4.0–10.5)
nRBC: 0 % (ref 0.0–0.2)

## 2020-07-13 NOTE — Progress Notes (Signed)
Occupational Therapy Treatment Patient Details Name: Allison Gardner MRN: [id number] DOB: 1923/04/26 Today's Date: 07/13/2020    History of present illness Pt is a 87 y/o female admitted secondary to L neck pain and SOB. Thought to be secondary to CHF exacerbation. PMH includes CVA with L sided weakness, CHF, CKD, liver cirrhosis, HTN, and memory loss.   OT comments  Pt progressing towards acute OT goals. Focus of session was UB/LB bathing and grooming task in standing. Pt needed 1 seated rest break of about 4 minutes, otherwise stood for all tasks at the sink. Pt r/o L neck pain, worse at end of session. Nursing notified and heat pack applied. D/c plan remains appropriate.    Follow Up Recommendations  Home health OT;Supervision/Assistance - 24 hour    Equipment Recommendations  None recommended by OT    Recommendations for Other Services      Precautions / Restrictions Precautions Precautions: Fall Restrictions Weight Bearing Restrictions: No       Mobility Bed Mobility               General bed mobility comments: EOB at start and end of session    Transfers Overall transfer level: Needs assistance Equipment used: Rolling walker (2 wheeled) Transfers: Sit to/from Stand Sit to Stand: Min guard         General transfer comment: sit to stand from bed and chair heights to RW    Balance Overall balance assessment: Needs assistance Sitting-balance support: Feet supported Sitting balance-Leahy Scale: Fair     Standing balance support: Bilateral upper extremity supported;No upper extremity supported Standing balance-Leahy Scale: Fair                             ADL either performed or assessed with clinical judgement   ADL Overall ADL's : Needs assistance/impaired     Grooming: Supervision/safety;Standing;Set up;Sitting;Wash/dry hands;Wash/dry face;Oral care   Upper Body Bathing: Set up;Sitting   Lower Body Bathing: Min guard;Sit to/from  stand                         General ADL Comments: Pt mostly stood for UB/LB bathing and oral care at sink. One seated rest break utilized for about 4 minutes before standing to complete.     Vision       Perception     Praxis      Cognition Arousal/Alertness: Awake/alert Behavior During Therapy: WFL for tasks assessed/performed Overall Cognitive Status: History of cognitive impairments - at baseline                                 General Comments: Some perseveration to task noted during UB/LB bathing        Exercises     Shoulder Instructions       General Comments      Pertinent Vitals/ Pain       Pain Assessment: Faces Faces Pain Scale: Hurts even more Pain Location: L neck increased from 2/10 to 6/10 by end of session Pain Descriptors / Indicators: Aching Pain Intervention(s): Monitored during session;Limited activity within patient's tolerance;Repositioned;Heat applied (nursing notified)  Home Living  Prior Functioning/Environment              Frequency  Min 2X/week        Progress Toward Goals  OT Goals(current goals can now be found in the care plan section)  Progress towards OT goals: Progressing toward goals  Acute Rehab OT Goals Patient Stated Goal: to get stronger and go home and to church OT Goal Formulation: With patient Time For Goal Achievement: 07/20/20 Potential to Achieve Goals: Good ADL Goals Pt Will Transfer to Toilet: with modified independence;ambulating Pt Will Perform Toileting - Clothing Manipulation and hygiene: with modified independence;sit to/from stand Additional ADL Goal #1: Pt/caregiver will independently verbalize 3 strategies to reduce risk of falls  Plan Discharge plan remains appropriate    Co-evaluation                 AM-PAC OT "6 Clicks" Daily Activity     Outcome Measure   Help from another person eating meals?:  None Help from another person taking care of personal grooming?: None Help from another person toileting, which includes using toliet, bedpan, or urinal?: A Little Help from another person bathing (including washing, rinsing, drying)?: A Little Help from another person to put on and taking off regular upper body clothing?: None Help from another person to put on and taking off regular lower body clothing?: A Little 6 Click Score: 21    End of Session Equipment Utilized During Treatment: Rolling walker  OT Visit Diagnosis: Unsteadiness on feet (R26.81);Other abnormalities of gait and mobility (R26.89);Muscle weakness (generalized) (M62.81);Other symptoms and signs involving cognitive function   Activity Tolerance Patient tolerated treatment well   Patient Left in bed;with call bell/phone within reach;Other (comment);with bed alarm set (sitting EOB)   Nurse Communication          [id number] OT Time Calculation (min): 41 min  Charges: OT General Charges $OT Visit: 1 Visit OT Treatments $Self Care/Home Management : 38-52 mins  Tyrone Schimke, OT Acute Rehabilitation Services Pager: [phone] Office: [phone]    Hortencia Pilar 07/13/2020, 10:44 AM

## 2020-07-13 NOTE — Progress Notes (Signed)
PT Cancellation Note  Patient Details Name: Allison Gardner MRN: [id number] DOB: 06/23/1922   Cancelled Treatment:    Reason Eval/Treat Not Completed: Other (comment).  Pt was seen by OT earlier and pt is too tired to be seen by PT.  Follow up if pt is not dc today for some reason.   Ramond Dial 07/13/2020, 11:18 AM Mee Hives, PT MS Acute Rehab Dept. Number: [facility] [place]

## 2020-07-13 NOTE — Progress Notes (Signed)
D/C instructions reprinted and placed in packet in room. Daughter, Hassan Rowan, states she will arrive around noon to transport pt home.
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
## 2020-07-28 ENCOUNTER — Inpatient Hospital Stay (HOSPITAL_COMMUNITY)
Admission: EM | Admit: 2020-07-28 | Discharge: 2020-08-01 | DRG: 291 | Disposition: A | Discharge status: Home Health | Payer: Medicare HMO | Attending: Internal Medicine | Admitting: Internal Medicine

## 2020-07-28 ENCOUNTER — Other Ambulatory Visit: Payer: Self-pay

## 2020-07-28 ENCOUNTER — Emergency Department (HOSPITAL_COMMUNITY): Payer: Medicare HMO

## 2020-07-28 ENCOUNTER — Encounter (HOSPITAL_COMMUNITY): Payer: Self-pay

## 2020-07-28 DIAGNOSIS — I5023 Acute on chronic systolic (congestive) heart failure: Secondary | ICD-10-CM | POA: Diagnosis present

## 2020-07-28 DIAGNOSIS — Z79890 Hormone replacement therapy (postmenopausal): Secondary | ICD-10-CM

## 2020-07-28 DIAGNOSIS — R54 Age-related physical debility: Secondary | ICD-10-CM | POA: Diagnosis present

## 2020-07-28 DIAGNOSIS — Z823 Family history of stroke: Secondary | ICD-10-CM

## 2020-07-28 DIAGNOSIS — I10 Essential (primary) hypertension: Secondary | ICD-10-CM | POA: Diagnosis present

## 2020-07-28 DIAGNOSIS — I420 Dilated cardiomyopathy: Secondary | ICD-10-CM | POA: Diagnosis present

## 2020-07-28 DIAGNOSIS — K5730 Diverticulosis of large intestine without perforation or abscess without bleeding: Secondary | ICD-10-CM | POA: Diagnosis present

## 2020-07-28 DIAGNOSIS — Z515 Encounter for palliative care: Secondary | ICD-10-CM

## 2020-07-28 DIAGNOSIS — I509 Heart failure, unspecified: Secondary | ICD-10-CM | POA: Diagnosis not present

## 2020-07-28 DIAGNOSIS — E039 Hypothyroidism, unspecified: Secondary | ICD-10-CM | POA: Diagnosis present

## 2020-07-28 DIAGNOSIS — Z8673 Personal history of transient ischemic attack (TIA), and cerebral infarction without residual deficits: Secondary | ICD-10-CM

## 2020-07-28 DIAGNOSIS — I248 Other forms of acute ischemic heart disease: Secondary | ICD-10-CM | POA: Diagnosis present

## 2020-07-28 DIAGNOSIS — N390 Urinary tract infection, site not specified: Secondary | ICD-10-CM | POA: Diagnosis present

## 2020-07-28 DIAGNOSIS — R1310 Dysphagia, unspecified: Secondary | ICD-10-CM | POA: Diagnosis present

## 2020-07-28 DIAGNOSIS — N179 Acute kidney failure, unspecified: Secondary | ICD-10-CM | POA: Diagnosis present

## 2020-07-28 DIAGNOSIS — Z885 Allergy status to narcotic agent status: Secondary | ICD-10-CM

## 2020-07-28 DIAGNOSIS — Z880 Allergy status to penicillin: Secondary | ICD-10-CM

## 2020-07-28 DIAGNOSIS — E440 Moderate protein-calorie malnutrition: Secondary | ICD-10-CM | POA: Diagnosis present

## 2020-07-28 DIAGNOSIS — G3184 Mild cognitive impairment, so stated: Secondary | ICD-10-CM | POA: Diagnosis present

## 2020-07-28 DIAGNOSIS — D696 Thrombocytopenia, unspecified: Secondary | ICD-10-CM | POA: Diagnosis present

## 2020-07-28 DIAGNOSIS — N1832 Chronic kidney disease, stage 3b: Secondary | ICD-10-CM | POA: Diagnosis present

## 2020-07-28 DIAGNOSIS — K838 Other specified diseases of biliary tract: Secondary | ICD-10-CM | POA: Diagnosis present

## 2020-07-28 DIAGNOSIS — Z20822 Contact with and (suspected) exposure to covid-19: Secondary | ICD-10-CM | POA: Diagnosis present

## 2020-07-28 DIAGNOSIS — K3189 Other diseases of stomach and duodenum: Secondary | ICD-10-CM | POA: Diagnosis present

## 2020-07-28 DIAGNOSIS — R112 Nausea with vomiting, unspecified: Secondary | ICD-10-CM

## 2020-07-28 DIAGNOSIS — Z7902 Long term (current) use of antithrombotics/antiplatelets: Secondary | ICD-10-CM

## 2020-07-28 DIAGNOSIS — D631 Anemia in chronic kidney disease: Secondary | ICD-10-CM | POA: Diagnosis present

## 2020-07-28 DIAGNOSIS — K219 Gastro-esophageal reflux disease without esophagitis: Secondary | ICD-10-CM | POA: Diagnosis present

## 2020-07-28 DIAGNOSIS — M5020 Other cervical disc displacement, unspecified cervical region: Secondary | ICD-10-CM | POA: Diagnosis present

## 2020-07-28 DIAGNOSIS — K2900 Acute gastritis without bleeding: Secondary | ICD-10-CM | POA: Diagnosis present

## 2020-07-28 DIAGNOSIS — I700 Atherosclerosis of aorta: Secondary | ICD-10-CM | POA: Diagnosis present

## 2020-07-28 DIAGNOSIS — I130 Hypertensive heart and chronic kidney disease with heart failure and stage 1 through stage 4 chronic kidney disease, or unspecified chronic kidney disease: Principal | ICD-10-CM | POA: Diagnosis present

## 2020-07-28 DIAGNOSIS — K7460 Unspecified cirrhosis of liver: Secondary | ICD-10-CM | POA: Diagnosis present

## 2020-07-28 DIAGNOSIS — I447 Left bundle-branch block, unspecified: Secondary | ICD-10-CM | POA: Diagnosis present

## 2020-07-28 DIAGNOSIS — I5022 Chronic systolic (congestive) heart failure: Secondary | ICD-10-CM | POA: Diagnosis present

## 2020-07-28 DIAGNOSIS — N184 Chronic kidney disease, stage 4 (severe): Secondary | ICD-10-CM | POA: Diagnosis present

## 2020-07-28 DIAGNOSIS — I5043 Acute on chronic combined systolic (congestive) and diastolic (congestive) heart failure: Secondary | ICD-10-CM | POA: Diagnosis present

## 2020-07-28 DIAGNOSIS — Z79899 Other long term (current) drug therapy: Secondary | ICD-10-CM

## 2020-07-28 DIAGNOSIS — E782 Mixed hyperlipidemia: Secondary | ICD-10-CM | POA: Diagnosis present

## 2020-07-28 DIAGNOSIS — I739 Peripheral vascular disease, unspecified: Secondary | ICD-10-CM | POA: Diagnosis present

## 2020-07-28 DIAGNOSIS — B952 Enterococcus as the cause of diseases classified elsewhere: Secondary | ICD-10-CM | POA: Diagnosis present

## 2020-07-28 DIAGNOSIS — I2510 Atherosclerotic heart disease of native coronary artery without angina pectoris: Secondary | ICD-10-CM | POA: Diagnosis present

## 2020-07-28 DIAGNOSIS — Z66 Do not resuscitate: Secondary | ICD-10-CM | POA: Diagnosis present

## 2020-07-28 DIAGNOSIS — I083 Combined rheumatic disorders of mitral, aortic and tricuspid valves: Secondary | ICD-10-CM | POA: Diagnosis present

## 2020-07-28 LAB — COMPREHENSIVE METABOLIC PANEL
ALT: 12 U/L (ref 0–44)
AST: 26 U/L (ref 15–41)
Albumin: 4.1 g/dL (ref 3.5–5.0)
Alkaline Phosphatase: 191 U/L — ABNORMAL HIGH (ref 38–126)
Anion gap: 14 (ref 5–15)
BUN: 51 mg/dL — ABNORMAL HIGH (ref 8–23)
CO2: 25 mmol/L (ref 22–32)
Calcium: 10 mg/dL (ref 8.9–10.3)
Chloride: 108 mmol/L (ref 98–111)
Creatinine, Ser: 2.24 mg/dL — ABNORMAL HIGH (ref 0.44–1.00)
GFR, Estimated: 20 mL/min — ABNORMAL LOW (ref >60)
Glucose, Bld: 117 mg/dL — ABNORMAL HIGH (ref 70–99)
Potassium: 3.7 mmol/L (ref 3.5–5.1)
Sodium: 147 mmol/L — ABNORMAL HIGH (ref 135–145)
Total Bilirubin: 1.1 mg/dL (ref 0.3–1.2)
Total Protein: 8 g/dL (ref 6.5–8.1)

## 2020-07-28 LAB — CBC WITH DIFFERENTIAL/PLATELET
Abs Immature Granulocytes: 0.02 10*3/uL (ref 0.00–0.07)
Basophils Absolute: 0 10*3/uL (ref 0.0–0.1)
Basophils Relative: 0 %
Eosinophils Absolute: 0 10*3/uL (ref 0.0–0.5)
Eosinophils Relative: 0 %
HCT: 32.9 % — ABNORMAL LOW (ref 36.0–46.0)
Hemoglobin: 10.5 g/dL — ABNORMAL LOW (ref 12.0–15.0)
Immature Granulocytes: 0 %
Lymphocytes Relative: 15 %
Lymphs Abs: 0.7 10*3/uL (ref 0.7–4.0)
MCH: 30.1 pg (ref 26.0–34.0)
MCHC: 31.9 g/dL (ref 30.0–36.0)
MCV: 94.3 fL (ref 80.0–100.0)
Monocytes Absolute: 0.2 10*3/uL (ref 0.1–1.0)
Monocytes Relative: 5 %
Neutro Abs: 3.8 10*3/uL (ref 1.7–7.7)
Neutrophils Relative %: 80 %
Platelets: 150 10*3/uL (ref 150–400)
RBC: 3.49 MIL/uL — ABNORMAL LOW (ref 3.87–5.11)
RDW: 18 % — ABNORMAL HIGH (ref 11.5–15.5)
WBC: 4.8 10*3/uL (ref 4.0–10.5)
nRBC: 0 % (ref 0.0–0.2)

## 2020-07-28 LAB — PROTIME-INR
INR: 1.1 (ref 0.8–1.2)
Prothrombin Time: 13.8 seconds (ref 11.4–15.2)

## 2020-07-28 LAB — TROPONIN I (HIGH SENSITIVITY): Troponin I (High Sensitivity): 91 ng/L — ABNORMAL HIGH (ref <18)

## 2020-07-28 LAB — LACTIC ACID, PLASMA: Lactic Acid, Venous: 1.4 mmol/L (ref 0.5–1.9)

## 2020-07-28 MED ORDER — ACETAMINOPHEN-CODEINE #3 300-30 MG PO TABS
1.0000 | ORAL_TABLET | Freq: Once | ORAL | Status: AC
  Administered 2020-07-28: 1 via ORAL
  Filled 2020-07-28: qty 1

## 2020-07-28 MED ORDER — PANTOPRAZOLE SODIUM 40 MG IV SOLR
40.0000 mg | Freq: Once | INTRAVENOUS | Status: AC
  Administered 2020-07-28: 40 mg via INTRAVENOUS
  Filled 2020-07-28: qty 40

## 2020-07-28 MED ORDER — ONDANSETRON HCL 4 MG/2ML IJ SOLN
4.0000 mg | Freq: Once | INTRAMUSCULAR | Status: AC
  Administered 2020-07-28: 4 mg via INTRAVENOUS
  Filled 2020-07-28: qty 2

## 2020-07-28 NOTE — ED Triage Notes (Signed)
Patient BIB [facility] from home with nausea and vomiting. Patient reporting black emesis. Tender in right upper quadrant. Last BM 3/18. History of CHF.

## 2020-07-28 NOTE — Progress Notes (Signed)
Daughter, [name], wants to be called about patient if she gets admitted or stays. [phone].   Or call sister Hassan Rowan, [phone].

## 2020-07-28 NOTE — ED Provider Notes (Signed)
[place] DEPT Provider Note   CSN: [id number] Arrival date & time: 07/28/20  2020     History Chief Complaint  Patient presents with  . Emesis    Allison Gardner is a 87 y.o. female.  The history is provided by the patient, a relative and medical records.  Emesis  Allison Gardner is a 87 y.o. female who presents to the Emergency Department complaining of vomiting.  She presents to the ED accompanied by her daughter for evaluation of vomiting for the last two days (7x daily).  Emesis is described as dark.  She has associated associated generalized abdominal pain.  She also reports associated left sided neck pain (present for the last three weeks).  She was recently admitted to the hospital for CHF.    Can swallow ok.  No fever, sob, diarrhea, constipation.  Has chronic lower extremity edema - unchanged from baseline.  Lives with son and daughter.      Past Medical History:  Diagnosis Date  . Abnormal CT scan, colon   . Acute blood loss anemia 05/15/2020  . Acute GI bleeding 05/15/2020  . Acute on chronic congestive heart failure ([place])   . Angiodysplasia of colon with hemorrhage   . Cervical spondylosis 07/07/2020  . CHF (congestive heart failure) (HCC)    EF: 30-35% in 9/21  . Chronic anticoagulation 05/15/2020  . Chronic combined systolic and diastolic congestive heart failure ([place]) 05/15/2020  . Chronic systolic heart failure ([place]) 05/15/2020  . Dilated cardiomyopathy ([place]) 05/15/2020  . Fracture of pubic ramus ([place]) 04/15/2013  . GI bleed 05/15/2020  . Headache(784.0)   . Kidney stones   . Memory loss   . Mixed hyperlipidemia 05/15/2020  . Neck pain on left side 07/07/2020  . Peripheral arterial occlusive disease ([place]) 05/15/2020  . Pressure injury of skin 05/16/2020  . Stroke [facility])     Patient Active Problem List   Diagnosis Date Noted  . Unsteadiness on feet 07/09/2020  . Muscle weakness (generalized) 07/09/2020  . Decreased activity  tolerance 07/09/2020  . Cognitive safety issue 07/09/2020  . Pain   . Frailty syndrome in geriatric patient   . At risk of pressure ulcer 07/07/2020  . Hypoalbuminemia 07/07/2020  . Neck pain on left side 07/07/2020  . Cervical spondylosis 07/07/2020  . Degenerative disc disease, cervical 07/07/2020  . Immobility 07/07/2020  . Malnutrition of moderate degree 07/07/2020  . Hip pain   . Acute on chronic systolic (congestive) heart failure ([place]) 07/06/2020  . Bilateral primary osteoarthritis of hip 07/06/2020  . Cardiac Cirrhosis, presumed   . Mixed hyperlipidemia 05/15/2020  . Chronic systolic heart failure ([place]) 05/15/2020  . GERD (gastroesophageal reflux disease) 05/15/2020  . Essential hypertension 05/15/2020  . Hypothyroidism 05/15/2020  . Thrombocytopenia ([place]) 05/15/2020  . Chronic kidney disease, stage 4 (severe) ([place]) 05/15/2020  . Hyperkalemia 05/15/2020  . Foot drop, left 09/28/2015  . Mild cognitive impairment 09/01/2013  . Late effects of cerebrovascular disease 09/03/2012  . Headache disorder 09/03/2012    Past Surgical History:  Procedure Laterality Date  . ABDOMINAL HYSTERECTOMY    . APPENDECTOMY    . COLONOSCOPY WITH PROPOFOL N/A 05/17/2020   Procedure: COLONOSCOPY WITH PROPOFOL;  Surgeon: Jerene Bears, MD;  Location: [place];  Service: Gastroenterology;  Laterality: N/A;  . ESOPHAGOGASTRODUODENOSCOPY (EGD) WITH PROPOFOL N/A 05/17/2020   Procedure: ESOPHAGOGASTRODUODENOSCOPY (EGD) WITH PROPOFOL;  Surgeon: Jerene Bears, MD;  Location: [facility] ENDOSCOPY;  Service: Gastroenterology;  Laterality: N/A;  .  HEMOSTASIS CLIP PLACEMENT  05/17/2020   Procedure: HEMOSTASIS CLIP PLACEMENT;  Surgeon: Jerene Bears, MD;  Location: [place];  Service: Gastroenterology;;  . HOT HEMOSTASIS N/A 05/17/2020   Procedure: HOT HEMOSTASIS (ARGON PLASMA COAGULATION/BICAP);  Surgeon: Jerene Bears, MD;  Location: [facility] ENDOSCOPY;  Service: Gastroenterology;  Laterality: N/A;     OB History    No obstetric history on file.     History reviewed. No pertinent family history.  Social History   Tobacco Use  . Smoking status: Never Smoker  . Smokeless tobacco: Never Used  Substance Use Topics  . Alcohol use: No  . Drug use: No    Home Medications Prior to Admission medications   Medication Sig Start Date End Date Taking? Authorizing Provider  carvedilol (COREG) 3.125 MG tablet Take 3.125 mg by mouth at bedtime.    [provider]  clopidogrel (PLAVIX) 75 MG tablet Take 75 mg by mouth daily.    [provider]  furosemide (LASIX) 80 MG tablet Take 1 tablet (80 mg total) by mouth 2 (two) times daily. 07/12/20 08/11/20  Lilland, Lorrin Goodell, DO  levothyroxine (SYNTHROID) 25 MCG tablet Take 25 mcg by mouth daily. 03/06/20   [provider]  Omega-3 Fatty Acids (FISH OIL) 1000 MG CAPS Take 1,000 mg by mouth every other day.    [provider]  pantoprazole (PROTONIX) 40 MG tablet Take 1 tablet (40 mg total) by mouth daily before breakfast. 05/20/20   Cristal Ford, DO  rosuvastatin (CRESTOR) 5 MG tablet Take 5 mg by mouth at bedtime. 08/31/15   [provider]  sacubitril-valsartan (ENTRESTO) 24-26 MG Take 1 tablet by mouth at bedtime.    [provider]    Allergies    Ampicillin and Demerol [meperidine]  Review of Systems   Review of Systems  Gastrointestinal: Positive for vomiting.  All other systems reviewed and are negative.   Physical Exam Updated Vital Signs BP 108/64 (BP Location: Right Arm)   Pulse 100   Temp 97.8 F (36.6 C) (Oral)   Resp 20   Ht 5' (1.524 m)   Wt 48 kg   SpO2 100%   BMI 20.66 kg/m   Physical Exam Vitals and nursing note reviewed.  Constitutional:      Appearance: She is well-developed.  HENT:     Head: Normocephalic and atraumatic.  Cardiovascular:     Rate and Rhythm: Normal rate and regular rhythm.     Heart sounds: No murmur heard.   Pulmonary:     Effort: Pulmonary effort is  normal. No respiratory distress.     Breath sounds: Normal breath sounds.  Abdominal:     Palpations: Abdomen is soft.     Tenderness: There is abdominal tenderness. There is no guarding or rebound.     Comments: Moderate generalized abdominal tenderness  Musculoskeletal:        General: No tenderness.     Comments: 1+ pitting edema to BLE  Skin:    General: Skin is warm and dry.  Neurological:     Mental Status: She is alert.     Comments: Oriented to person and place.  Disoriented to time.    Psychiatric:        Behavior: Behavior normal.     ED Results / Procedures / Treatments   Labs (all labs ordered are listed, but only abnormal results are displayed) Labs Reviewed - No data to display  EKG None  Radiology No results found.  Procedures  Procedures   Medications Ordered in ED Medications - No data to display  ED Course  I have reviewed the triage vital signs and the nursing notes.  Pertinent labs & imaging results that were available during my care of the patient were reviewed by me and considered in my medical decision making (see chart for details).    MDM Rules/Calculators/A&P                         patient with history of CHF, CKD here for evaluation of two days of persistent dark emesis from home with associated generalized abdominal pain. She has generalized tenderness on examination without peritoneal findings. Given her recent admission for CHF IV fluid hydration withheld at this time. Will treat with antiemetics for nausea. CT scan with concerning findings for possible gastric ulcer and she was treated with Protonix. Patient care transferred pending labs and reassessment.   Final Clinical Impression(s) / ED Diagnoses Final diagnoses:  None    Rx / DC Orders ED Discharge Orders    None       Quintella Reichert, MD 07/28/20 2234

## 2020-07-28 NOTE — ED Notes (Signed)
Pt transported to CT ?

## 2020-07-28 NOTE — ED Notes (Signed)
Purewick placed on patient and patient reminded that we need a urine sample.

## 2020-07-28 NOTE — ED Notes (Signed)
Patient asking for pain medication. Dr. Florina Ou notified.

## 2020-07-29 DIAGNOSIS — I5021 Acute systolic (congestive) heart failure: Secondary | ICD-10-CM

## 2020-07-29 DIAGNOSIS — N184 Chronic kidney disease, stage 4 (severe): Secondary | ICD-10-CM | POA: Diagnosis not present

## 2020-07-29 DIAGNOSIS — I5043 Acute on chronic combined systolic (congestive) and diastolic (congestive) heart failure: Secondary | ICD-10-CM | POA: Diagnosis present

## 2020-07-29 DIAGNOSIS — I739 Peripheral vascular disease, unspecified: Secondary | ICD-10-CM | POA: Diagnosis present

## 2020-07-29 DIAGNOSIS — D696 Thrombocytopenia, unspecified: Secondary | ICD-10-CM

## 2020-07-29 DIAGNOSIS — I248 Other forms of acute ischemic heart disease: Secondary | ICD-10-CM | POA: Diagnosis present

## 2020-07-29 DIAGNOSIS — I10 Essential (primary) hypertension: Secondary | ICD-10-CM | POA: Diagnosis not present

## 2020-07-29 DIAGNOSIS — K2900 Acute gastritis without bleeding: Secondary | ICD-10-CM

## 2020-07-29 DIAGNOSIS — R54 Age-related physical debility: Secondary | ICD-10-CM | POA: Diagnosis present

## 2020-07-29 DIAGNOSIS — I509 Heart failure, unspecified: Secondary | ICD-10-CM | POA: Diagnosis present

## 2020-07-29 DIAGNOSIS — I700 Atherosclerosis of aorta: Secondary | ICD-10-CM | POA: Diagnosis present

## 2020-07-29 DIAGNOSIS — R778 Other specified abnormalities of plasma proteins: Secondary | ICD-10-CM

## 2020-07-29 DIAGNOSIS — I5023 Acute on chronic systolic (congestive) heart failure: Secondary | ICD-10-CM | POA: Diagnosis not present

## 2020-07-29 DIAGNOSIS — R112 Nausea with vomiting, unspecified: Secondary | ICD-10-CM

## 2020-07-29 DIAGNOSIS — E782 Mixed hyperlipidemia: Secondary | ICD-10-CM | POA: Diagnosis present

## 2020-07-29 DIAGNOSIS — E039 Hypothyroidism, unspecified: Secondary | ICD-10-CM

## 2020-07-29 DIAGNOSIS — E440 Moderate protein-calorie malnutrition: Secondary | ICD-10-CM

## 2020-07-29 DIAGNOSIS — I130 Hypertensive heart and chronic kidney disease with heart failure and stage 1 through stage 4 chronic kidney disease, or unspecified chronic kidney disease: Secondary | ICD-10-CM | POA: Diagnosis present

## 2020-07-29 DIAGNOSIS — Z66 Do not resuscitate: Secondary | ICD-10-CM | POA: Diagnosis present

## 2020-07-29 DIAGNOSIS — I420 Dilated cardiomyopathy: Secondary | ICD-10-CM | POA: Diagnosis present

## 2020-07-29 DIAGNOSIS — G3184 Mild cognitive impairment, so stated: Secondary | ICD-10-CM | POA: Diagnosis present

## 2020-07-29 DIAGNOSIS — I5022 Chronic systolic (congestive) heart failure: Secondary | ICD-10-CM | POA: Diagnosis not present

## 2020-07-29 DIAGNOSIS — N390 Urinary tract infection, site not specified: Secondary | ICD-10-CM | POA: Diagnosis present

## 2020-07-29 DIAGNOSIS — B952 Enterococcus as the cause of diseases classified elsewhere: Secondary | ICD-10-CM | POA: Diagnosis present

## 2020-07-29 DIAGNOSIS — E7800 Pure hypercholesterolemia, unspecified: Secondary | ICD-10-CM | POA: Diagnosis not present

## 2020-07-29 DIAGNOSIS — M5020 Other cervical disc displacement, unspecified cervical region: Secondary | ICD-10-CM | POA: Diagnosis present

## 2020-07-29 DIAGNOSIS — I2510 Atherosclerotic heart disease of native coronary artery without angina pectoris: Secondary | ICD-10-CM | POA: Diagnosis present

## 2020-07-29 DIAGNOSIS — N1832 Chronic kidney disease, stage 3b: Secondary | ICD-10-CM | POA: Diagnosis present

## 2020-07-29 DIAGNOSIS — N179 Acute kidney failure, unspecified: Secondary | ICD-10-CM | POA: Diagnosis present

## 2020-07-29 DIAGNOSIS — K219 Gastro-esophageal reflux disease without esophagitis: Secondary | ICD-10-CM

## 2020-07-29 DIAGNOSIS — Z515 Encounter for palliative care: Secondary | ICD-10-CM | POA: Diagnosis not present

## 2020-07-29 DIAGNOSIS — Z8673 Personal history of transient ischemic attack (TIA), and cerebral infarction without residual deficits: Secondary | ICD-10-CM | POA: Diagnosis not present

## 2020-07-29 DIAGNOSIS — I447 Left bundle-branch block, unspecified: Secondary | ICD-10-CM | POA: Diagnosis present

## 2020-07-29 DIAGNOSIS — Z20822 Contact with and (suspected) exposure to covid-19: Secondary | ICD-10-CM | POA: Diagnosis present

## 2020-07-29 LAB — URINALYSIS, ROUTINE W REFLEX MICROSCOPIC
Bacteria, UA: NONE SEEN
Bilirubin Urine: NEGATIVE
Glucose, UA: NEGATIVE mg/dL
Ketones, ur: 5 mg/dL — AB
Leukocytes,Ua: NEGATIVE
Nitrite: NEGATIVE
Protein, ur: 100 mg/dL — AB
Specific Gravity, Urine: 1.023 (ref 1.005–1.030)
pH: 5 (ref 5.0–8.0)

## 2020-07-29 LAB — CBC
HCT: 29.4 % — ABNORMAL LOW (ref 36.0–46.0)
Hemoglobin: 9.4 g/dL — ABNORMAL LOW (ref 12.0–15.0)
MCH: 30.8 pg (ref 26.0–34.0)
MCHC: 32 g/dL (ref 30.0–36.0)
MCV: 96.4 fL (ref 80.0–100.0)
Platelets: 134 10*3/uL — ABNORMAL LOW (ref 150–400)
RBC: 3.05 MIL/uL — ABNORMAL LOW (ref 3.87–5.11)
RDW: 17.9 % — ABNORMAL HIGH (ref 11.5–15.5)
WBC: 3.4 10*3/uL — ABNORMAL LOW (ref 4.0–10.5)
nRBC: 0 % (ref 0.0–0.2)

## 2020-07-29 LAB — BASIC METABOLIC PANEL
Anion gap: 11 (ref 5–15)
BUN: 50 mg/dL — ABNORMAL HIGH (ref 8–23)
CO2: 25 mmol/L (ref 22–32)
Calcium: 9.1 mg/dL (ref 8.9–10.3)
Chloride: 110 mmol/L (ref 98–111)
Creatinine, Ser: 2.17 mg/dL — ABNORMAL HIGH (ref 0.44–1.00)
GFR, Estimated: 20 mL/min — ABNORMAL LOW (ref >60)
Glucose, Bld: 86 mg/dL (ref 70–99)
Potassium: 5.1 mmol/L (ref 3.5–5.1)
Sodium: 146 mmol/L — ABNORMAL HIGH (ref 135–145)

## 2020-07-29 LAB — TROPONIN I (HIGH SENSITIVITY): Troponin I (High Sensitivity): 89 ng/L — ABNORMAL HIGH (ref <18)

## 2020-07-29 LAB — SARS CORONAVIRUS 2 (TAT 6-24 HRS): SARS Coronavirus 2: NEGATIVE

## 2020-07-29 MED ORDER — PANTOPRAZOLE SODIUM 40 MG IV SOLR
40.0000 mg | Freq: Two times a day (BID) | INTRAVENOUS | Status: DC
  Administered 2020-07-29 – 2020-08-01 (×8): 40 mg via INTRAVENOUS
  Filled 2020-07-29 (×8): qty 40

## 2020-07-29 MED ORDER — ACETAMINOPHEN 650 MG RE SUPP: 650.0000 mg | Freq: Four times a day (QID) | RECTAL | Status: DC | PRN

## 2020-07-29 MED ORDER — ROSUVASTATIN CALCIUM 5 MG PO TABS
5.0000 mg | ORAL_TABLET | Freq: Every day | ORAL | Status: DC
  Administered 2020-07-29 – 2020-07-31 (×3): 5 mg via ORAL
  Filled 2020-07-29 (×3): qty 1

## 2020-07-29 MED ORDER — ACETAMINOPHEN-CODEINE #3 300-30 MG PO TABS
1.0000 | ORAL_TABLET | ORAL | Status: DC | PRN
  Administered 2020-07-29: 1 via ORAL
  Filled 2020-07-29: qty 1

## 2020-07-29 MED ORDER — ACETAMINOPHEN 325 MG PO TABS
650.0000 mg | ORAL_TABLET | Freq: Four times a day (QID) | ORAL | Status: DC | PRN
  Administered 2020-07-29: 650 mg via ORAL
  Filled 2020-07-29: qty 2

## 2020-07-29 MED ORDER — FUROSEMIDE 10 MG/ML IJ SOLN
80.0000 mg | Freq: Two times a day (BID) | INTRAMUSCULAR | Status: DC
  Administered 2020-07-29 – 2020-08-01 (×7): 80 mg via INTRAVENOUS
  Filled 2020-07-29 (×7): qty 8

## 2020-07-29 MED ORDER — MAGNESIUM HYDROXIDE 400 MG/5ML PO SUSP: 30.0000 mL | Freq: Every day | ORAL | Status: DC | PRN

## 2020-07-29 MED ORDER — ENOXAPARIN SODIUM 30 MG/0.3ML ~~LOC~~ SOLN
30.0000 mg | SUBCUTANEOUS | Status: DC
  Administered 2020-07-29 – 2020-08-01 (×4): 30 mg via SUBCUTANEOUS
  Filled 2020-07-29 (×4): qty 0.3

## 2020-07-29 MED ORDER — ONDANSETRON HCL 4 MG/2ML IJ SOLN: 4.0000 mg | Freq: Four times a day (QID) | INTRAMUSCULAR | Status: DC | PRN

## 2020-07-29 MED ORDER — ONDANSETRON HCL 4 MG PO TABS: 4.0000 mg | ORAL_TABLET | Freq: Four times a day (QID) | ORAL | Status: DC | PRN

## 2020-07-29 MED ORDER — SODIUM CHLORIDE 0.9 % IV SOLN
250.0000 mL | INTRAVENOUS | Status: DC | PRN
Start: 2020-07-29 — End: 2020-08-01

## 2020-07-29 MED ORDER — ISOSORBIDE MONONITRATE ER 30 MG PO TB24
15.0000 mg | ORAL_TABLET | Freq: Every day | ORAL | Status: DC
  Administered 2020-07-29 – 2020-08-01 (×4): 15 mg via ORAL
  Filled 2020-07-29 (×4): qty 1

## 2020-07-29 MED ORDER — HYDRALAZINE HCL 10 MG PO TABS
10.0000 mg | ORAL_TABLET | Freq: Three times a day (TID) | ORAL | Status: DC
  Administered 2020-07-29 – 2020-08-01 (×9): 10 mg via ORAL
  Filled 2020-07-29 (×10): qty 1

## 2020-07-29 MED ORDER — CARVEDILOL 3.125 MG PO TABS
3.1250 mg | ORAL_TABLET | Freq: Two times a day (BID) | ORAL | Status: DC
  Administered 2020-07-29: 3.125 mg via ORAL
  Filled 2020-07-29: qty 1

## 2020-07-29 MED ORDER — SODIUM CHLORIDE 0.9% FLUSH
3.0000 mL | INTRAVENOUS | Status: DC | PRN
  Administered 2020-08-01: 3 mL via INTRAVENOUS

## 2020-07-29 MED ORDER — CLOPIDOGREL BISULFATE 75 MG PO TABS
75.0000 mg | ORAL_TABLET | Freq: Every day | ORAL | Status: DC
  Administered 2020-07-29 – 2020-08-01 (×4): 75 mg via ORAL
  Filled 2020-07-29 (×4): qty 1

## 2020-07-29 MED ORDER — LEVOTHYROXINE SODIUM 25 MCG PO TABS
25.0000 ug | ORAL_TABLET | Freq: Every day | ORAL | Status: DC
  Administered 2020-07-29 – 2020-08-01 (×4): 25 ug via ORAL
  Filled 2020-07-29 (×4): qty 1

## 2020-07-29 MED ORDER — SODIUM CHLORIDE 0.9% FLUSH
3.0000 mL | Freq: Two times a day (BID) | INTRAVENOUS | Status: DC
  Administered 2020-07-29 – 2020-07-31 (×7): 3 mL via INTRAVENOUS

## 2020-07-29 MED ORDER — TRAZODONE HCL 50 MG PO TABS
25.0000 mg | ORAL_TABLET | Freq: Every evening | ORAL | Status: DC | PRN
  Administered 2020-07-31: 25 mg via ORAL
  Filled 2020-07-29: qty 1

## 2020-07-29 NOTE — Consult Note (Addendum)
Cardiology Consultation:   Patient ID: Allison Gardner MRN: [id number]; DOB: [birth year]  Admit date: 07/28/2020 Date of Consult: 07/29/2020  Primary Care Provider: Andres Shad, MD [facility] Cardiologist: None (NEW) [facility] Electrophysiologist:  None    Patient Profile:   Allison Gardner is a 87 y.o. female with a hx of multiple medical problems including peripheral arterial disease, colonic angiodysplasia with history of GI bleeding, CVA, CHF, and dilated cardiomyopathy as well as dyslipidemia in Palliative Care in [place] who is being seen today for the evaluation of CHF and elevated troponin at the request of Eric [place] DO.  History of Present Illness:   Allison Gardner is a 87yo female with a hx of chronic combined systolic/diastolic CHF, DCM (EF 99991111 by echo 01/2020 and 20-25% with severe MR on echo 07/06/2020), HLD, PVD, CVA.  Over the past few days she has had N/V which became intractable with dark green emesis and abdominal pain with up to 7 episodes of vomiting daily.  She presented to [facility] ER with N/V, worsening DOE and PND.  In ER SCR 2.24, hsTrop 91, Hbg 10.5 and EKG showed NSR with LBBB.  Cxray c/w vascular congestion and mild interstitial edema.  CT of abdomen showed gastritis, cirrhosis, right pleural effusion, diverticulosis.  Cardiology is asked to see regarding acute on chronic CHF. She was started on IV diuretics.     Past Medical History:  Diagnosis Date  . Abnormal CT scan, colon   . Acute blood loss anemia 05/15/2020  . Acute GI bleeding 05/15/2020  . Acute on chronic congestive heart failure ([place])   . Angiodysplasia of colon with hemorrhage   . Cervical spondylosis 07/07/2020  . CHF (congestive heart failure) (HCC)    EF: 30-35% in 9/21  . Chronic anticoagulation 05/15/2020  . Chronic combined systolic and diastolic congestive heart failure ([place]) 05/15/2020  . Chronic systolic heart failure ([place]) 05/15/2020  . Dilated cardiomyopathy ([place])  05/15/2020  . Fracture of pubic ramus ([place]) 04/15/2013  . GI bleed 05/15/2020  . Headache(784.0)   . Kidney stones   . Memory loss   . Mixed hyperlipidemia 05/15/2020  . Neck pain on left side 07/07/2020  . Peripheral arterial occlusive disease ([place]) 05/15/2020  . Pressure injury of skin 05/16/2020  . Stroke [facility])     Past Surgical History:  Procedure Laterality Date  . ABDOMINAL HYSTERECTOMY    . APPENDECTOMY    . COLONOSCOPY WITH PROPOFOL N/A 05/17/2020   Procedure: COLONOSCOPY WITH PROPOFOL;  Surgeon: Jerene Bears, MD;  Location: [place];  Service: Gastroenterology;  Laterality: N/A;  . ESOPHAGOGASTRODUODENOSCOPY (EGD) WITH PROPOFOL N/A 05/17/2020   Procedure: ESOPHAGOGASTRODUODENOSCOPY (EGD) WITH PROPOFOL;  Surgeon: Jerene Bears, MD;  Location: [facility] ENDOSCOPY;  Service: Gastroenterology;  Laterality: N/A;  . HEMOSTASIS CLIP PLACEMENT  05/17/2020   Procedure: HEMOSTASIS CLIP PLACEMENT;  Surgeon: Jerene Bears, MD;  Location: [place] ENDOSCOPY;  Service: Gastroenterology;;  . HOT HEMOSTASIS N/A 05/17/2020   Procedure: HOT HEMOSTASIS (ARGON PLASMA COAGULATION/BICAP);  Surgeon: Jerene Bears, MD;  Location: [facility] ENDOSCOPY;  Service: Gastroenterology;  Laterality: N/A;     Home Medications:  Prior to Admission medications   Medication Sig Start Date End Date Taking? Authorizing Provider  acetaminophen-codeine (TYLENOL #3) 300-30 MG tablet Take 1 tablet by mouth every 4 (four) hours as needed for moderate pain.   Yes [provider]  carvedilol (COREG) 3.125 MG tablet Take 3.125 mg by mouth 2 (two) times daily with a meal.  Yes [provider]  clopidogrel (PLAVIX) 75 MG tablet Take 75 mg by mouth daily.   Yes [provider]  furosemide (LASIX) 80 MG tablet Take 1 tablet (80 mg total) by mouth 2 (two) times daily. 07/12/20 08/11/20 Yes Lilland, Alana, DO  levothyroxine (SYNTHROID) 25 MCG tablet Take 25 mcg by mouth daily. 03/06/20  Yes [provider]  pantoprazole  (PROTONIX) 40 MG tablet Take 1 tablet (40 mg total) by mouth daily before breakfast. 05/20/20  Yes Mikhail, [place], DO  rosuvastatin (CRESTOR) 5 MG tablet Take 5 mg by mouth at bedtime. 08/31/15  Yes [provider]  sacubitril-valsartan (ENTRESTO) 24-26 MG Take 1 tablet by mouth at bedtime.   Yes [provider]    Inpatient Medications: Scheduled Meds: . carvedilol  3.125 mg Oral BID WC  . clopidogrel  75 mg Oral Daily  . enoxaparin (LOVENOX) injection  30 mg Subcutaneous Q24H  . furosemide  80 mg Intravenous Q12H  . levothyroxine  25 mcg Oral Daily  . pantoprazole (PROTONIX) IV  40 mg Intravenous Q12H  . rosuvastatin  5 mg Oral QHS  . sodium chloride flush  3 mL Intravenous Q12H   Continuous Infusions: . sodium chloride     PRN Meds: sodium chloride, acetaminophen **OR** acetaminophen, acetaminophen-codeine, magnesium hydroxide, ondansetron **OR** ondansetron (ZOFRAN) IV, sodium chloride flush, traZODone  Allergies:    Allergies  Allergen Reactions  . Ampicillin   . Demerol [Meperidine] Other (See Comments)    Unknown reaction    Social History:   Social History   Socioeconomic History  . Marital status: Widowed    Spouse name: Not on file  . Number of children: Not on file  . Years of education: Not on file  . Highest education level: Not on file  Occupational History  . Not on file  Tobacco Use  . Smoking status: Never Smoker  . Smokeless tobacco: Never Used  Substance and Sexual Activity  . Alcohol use: No  . Drug use: No  . Sexual activity: Not on file  Other Topics Concern  . Not on file  Social History Narrative   Patient lives at home her son stay with her.   Social Determinants of Health   Financial Resource Strain: Not on file  Food Insecurity: Not on file  Transportation Needs: Not on file  Physical Activity: Not on file  Stress: Not on file  Social Connections: Not on file  Intimate Partner Violence: Not on file    Family  History:   History reviewed. No pertinent family history.   ROS:  Please see the history of present illness.   All other ROS reviewed and negative.     Physical Exam/Data:   Vitals:   07/29/20 0600 07/29/20 0631 07/29/20 0700 07/29/20 0930  BP: 115/73 100/71 112/69 115/63  Pulse: 81 73 79 78  Resp: '11 12 11 11  '$ Temp:      TempSrc:      SpO2: 99% 100% 96% 100%  Weight:      Height:        Intake/Output Summary (Last 24 hours) at 07/29/2020 1005 Last data filed at 07/29/2020 0115 Gross per 24 hour  Intake -  Output 50 ml  Net -50 ml   Last 3 Weights 07/28/2020 07/13/2020 07/12/2020  Weight (lbs) 105 lb 12.8 oz 105 lb 12.8 oz 112 lb 14 oz  Weight (kg) 47.991 kg 47.991 kg 51.2 kg     Body mass index is 20.66 kg/m.  General:  Well nourished, well developed, in no acute distress HEENT: normal Lymph: no adenopathy Neck: no JVD Endocrine:  No thryomegaly Vascular: No carotid bruits; FA pulses 2+ bilaterally without bruits  Cardiac:  normal S1, S2; RRR; no murmur  Lungs:  clear to auscultation bilaterally, no wheezing, rhonchi or rales  Abd: soft, nontender, no hepatomegaly  Ext: no edema Musculoskeletal:  No deformities, BUE and BLE strength normal and equal Skin: warm and dry  Neuro:  CNs 2-12 intact, no focal abnormalities noted Psych:  Normal affect   EKG:  The EKG was personally reviewed and demonstrates:  NSR with LBBB Telemetry:  Telemetry was personally reviewed and demonstrates:  NSR  Relevant CV Studies: 2D echo 06/2020 IMPRESSIONS   1. Left ventricular ejection fraction, by estimation, is 20 to 25%. The  left ventricle has severely decreased function. The left ventricle  demonstrates global hypokinesis. There is mild left ventricular  hypertrophy. Left ventricular diastolic parameters  are indeterminate.  2. Right ventricular systolic function is normal. The right ventricular  size is normal. There is mildly elevated pulmonary artery systolic  pressure.   3. Left atrial size was moderately dilated.  4. Right atrial size was mildly dilated.  5. The mitral valve is normal in structure. Severe mitral valve  regurgitation. No evidence of mitral stenosis.  6. The tricuspid valve is degenerative. Tricuspid valve regurgitation is  moderate.  7. The aortic valve is tricuspid. Aortic valve regurgitation is mild.  Mild to moderate aortic valve sclerosis/calcification is present, without  any evidence of aortic stenosis.  8. The inferior vena cava is normal in size with greater than 50%  respiratory variability, suggesting right atrial pressure of 3 mmHg.    Laboratory Data:  High Sensitivity Troponin:   Recent Labs  Lab 07/05/20 1256 07/05/20 1456 07/28/20 2125 07/28/20 2325  TROPONINIHS 111* 122* 91* 89*     Chemistry Recent Labs  Lab 07/28/20 2125 07/29/20 0440  NA 147* 146*  K 3.7 5.1  CL 108 110  CO2 25 25  GLUCOSE 117* 86  BUN 51* 50*  CREATININE 2.24* 2.17*  CALCIUM 10.0 9.1  GFRNONAA 20* 20*  ANIONGAP 14 11    Recent Labs  Lab 07/28/20 2125  PROT 8.0  ALBUMIN 4.1  AST 26  ALT 12  ALKPHOS 191*  BILITOT 1.1   Hematology Recent Labs  Lab 07/28/20 2125 07/29/20 0440  WBC 4.8 3.4*  RBC 3.49* 3.05*  HGB 10.5* 9.4*  HCT 32.9* 29.4*  MCV 94.3 96.4  MCH 30.1 30.8  MCHC 31.9 32.0  RDW 18.0* 17.9*  PLT 150 134*   BNPNo results for input(s): BNP, PROBNP in the last 168 hours.  DDimer No results for input(s): DDIMER in the last 168 hours.   Radiology/Studies:  CT Abdomen Pelvis Wo Contrast  Result Date: 07/28/2020 CLINICAL DATA:  87 year old with abdominal distension and acute abdominal pain. Vomiting for 2 days. EXAM: CT ABDOMEN AND PELVIS WITHOUT CONTRAST TECHNIQUE: Multidetector CT imaging of the abdomen and pelvis was performed following the standard protocol without IV contrast. COMPARISON:  Right upper quadrant ultrasound 07/07/2020 FINDINGS: Lower chest: Moderate right pleural effusion partially  included. Cardiomegaly. Decreased blood pool consistent with anemia. No pericardial effusion. Hepatobiliary: Nodular hepatic contours consistent with cirrhosis. No evidence of focal hepatic lesion on noncontrast exam. Unremarkable gallbladder. No calcified gallstone. Proximal common bile duct appears dilated of 15 mm, and there is tapering to the duodenal insertion. Pancreas: Mildly atrophic.  No ductal dilatation or inflammation.  Spleen: No splenomegaly.  No focal splenic abnormality. Adrenals/Urinary Tract: No adrenal nodule. No hydronephrosis. Multiple bilateral renal cysts. There are also subcentimeter hyperdense lesions in the upper left and mid right kidney, likely hyperdense cysts. Possible punctate nonobstructing stone in the upper right kidney. No ureteral stone. Decompressed urinary bladder. Stomach/Bowel: Distended stomach with air-fluid level. There is mild pre pyloric gastric wall thickening. Decompressed small bowel without obstruction or inflammation. Appendix not visualized, prior appendectomy per history diverticulosis throughout the entire colon, most prominent distally. No diverticulitis. No colonic inflammation. Vascular/Lymphatic: Moderately advanced aortic atherosclerosis. No aortic aneurysm. There are few calcified right upper quadrant lymph nodes suggesting prior granulomatous disease. No noncalcified adenopathy. Reproductive: Status post hysterectomy. No adnexal masses. Other: No significant intra-abdominal ascites. No free air or focal fluid collection. There is mild generalized body wall edema. No abdominal wall hernia. Musculoskeletal: Degenerative disc disease and facet hypertrophy in the spine. There are no acute or suspicious osseous abnormalities. IMPRESSION: 1. Mild pre pyloric gastric wall thickening may be related to peptic ulcer disease or gastritis. Mild gastric distension with fluid level. 2. Cirrhosis. Mild common bile duct dilatation which normally tapers at the duodenal  insertion. 3. Moderate right pleural effusion partially included. Cardiomegaly. 4. Colonic diverticulosis without diverticulitis. 5. Possible punctate nonobstructing stone in the upper right kidney. 6. Bilateral renal cysts. Additional subcentimeter hyperdense lesions in both kidneys are likely hyperdense cysts, but are not well characterized on noncontrast exam. Aortic Atherosclerosis (ICD10-I70.0). Electronically Signed   By: Keith Rake M.D.   On: 07/28/2020 22:05   DG Chest Port 1 View  Result Date: 07/28/2020 CLINICAL DATA:  Vomiting EXAM: PORTABLE CHEST 1 VIEW COMPARISON:  07/05/2020 FINDINGS: Cardiomegaly, vascular congestion. Interstitial prominence may reflect mild interstitial edema. Small bilateral effusions suspected. No acute bony abnormality. IMPRESSION: Cardiomegaly with vascular congestion and probable mild interstitial edema. Electronically Signed   By: Rolm Baptise M.D.   On: 07/28/2020 21:51     Assessment and Plan:   Acute on chronic combined systolic/diastolic CHF 2D echo [id number] showed worsening LVF with EF 20-25% (previously 30-35% in 2021) with severe MR Admitted with N/V/abdominal pain as well as DOE and PND and found to have LE edema and CHF on CXRAY LVF globally down with no focal wall motion abnormalities Started on IV Lasix but I&O's appear inaccurate SCr elevated at 2.24 (1.56 in early March) and decreased to 2.17 with diuresis today Weight not recorded continue IV Lasix '80mg'$  BID and follow strict I&O's, daily weight and renal function closely By review of home meds she had been on carvedilol and Entresto in the past but Entresto once daily  now stopped in setting of AKI>>would not restart since she has known baseline CKD stage 3b Will hold BB for now given acute CHF No ACE/ARB/ARNi due to AKI on CKD Start Hydralazine '10mg'$  TID and Imdur '15mg'$  daily Given advanced age, not a candidate for advanced therapies or invasive procedures >> she also is in Palliative Care  and is DNR  Elevated hs Troponin Likely related to demand ischemia in the setting of acute gastritis, acute CHF and AKI>>no c/w ACS Given advanced age, not a candidate for invasive procedures Continue medical management>>she is already on Plavix and statin for PAD Holding BB in setting of acute CHF  Mitral Regurgitation Severe by recent echo and likely related to LV dysfunction Given advanced age would not pursue invasive procedures such as MitaClip Manage with HF meds and diuretics  PAD Continue Plavix, statin  AKI  Possibly related to passive congestion from acute CHF as well as volume depletion from N/V Continue to follow with diuresis  Acute gastritis -per TRH     New York Heart Association (NYHA) Functional Class NYHA Class III   For questions or updates, please contact [facility] Please consult [email or web address] for contact info under    Signed, Fransico Him, MD  07/29/2020 10:05 AM

## 2020-07-29 NOTE — H&P (Addendum)
[place]   PATIENT NAME: Allison Gardner    MR#:  [id number]  DATE OF BIRTH:  [birth year]  DATE OF ADMISSION:  07/28/2020  PRIMARY CARE PHYSICIAN: Andres Shad, MD   Patient is coming from: SNF REQUESTING/REFERRING PHYSICIAN: Molpus, John, MD  CHIEF COMPLAINT:   Chief Complaint  Patient presents with  . Emesis    HISTORY OF PRESENT ILLNESS:  Allison Gardner is a 87 y.o. female with medical history significant for multiple medical problems including peripheral arterial disease, colonic angiodysplasia with history of GI bleeding, CVA, CHF, and dilated cardiomyopathy as well as dyslipidemia, who presented to the emergency room with acute onset of intractable nausea and vomiting for the last couple of days.  She admitted to about 7 vomiting episodes per day with occasional dark green emesis.  And mild generalized abdominal pain.  She also admitted to left-sided neck pain.  She denied any chest pain or palpitations however admits to dyspnea as well as orthopnea and worsening lower extremity edema with dyspnea on exertion and paroxysmal nocturnal dyspnea.  No fever or chills.  No melena or bright red bleeding per rectum.  No dysuria, oliguria or hematuria or flank pain.  ED Course: When she came to the ER vital signs were within normal.  Labs revealed a BUN of 51 and creatinine 2.24 with alkaline phosphatase of 191 and high-sensitivity troponin I of 91.  Lactic acid was 1.4 and CBC showed anemia with hemoglobin of 10.5 and hematocrit of 32.9 slightly better than previous levels earlier this month.  PT was 13.8 and INR 1.1.  For now is 4 letter 89. EKG as reviewed by me : showed sinus rhythm rate of 99 with left bundle branch block Imaging: Portable chest ray showed cardiomegaly with vascular congestion and probable mild interstitial edema. CT of the abdomen and pelvis showed the following: 1. Mild pre pyloric gastric wall thickening may be related to peptic ulcer  disease or gastritis. Mild gastric distension with fluid level. 2. Cirrhosis. Mild common bile duct dilatation which normally tapers at the duodenal insertion. 3. Moderate right pleural effusion partially included. Cardiomegaly. 4. Colonic diverticulosis without diverticulitis. 5. Possible punctate nonobstructing stone in the upper right kidney. 6. Bilateral renal cysts. Additional subcentimeter hyperdense lesions in both kidneys are likely hyperdense cysts, but are not well characterized on noncontrast exam. 7.  Aortic Atherosclerosis.  The patient was given 4 mg of IV Zofran and 40 mg IV Protonix.  She will be admitted to telemetry bed for further evaluation and management.  PAST MEDICAL HISTORY:   Past Medical History:  Diagnosis Date  . Abnormal CT scan, colon   . Acute blood loss anemia 05/15/2020  . Acute GI bleeding 05/15/2020  . Acute on chronic congestive heart failure ([place])   . Angiodysplasia of colon with hemorrhage   . Cervical spondylosis 07/07/2020  . CHF (congestive heart failure) ([facility])    EF: 30-35% in 9/21  . Chronic anticoagulation 05/15/2020  . Chronic combined systolic and diastolic congestive heart failure ([place]) 05/15/2020  . Chronic systolic heart failure ([place]) 05/15/2020  . Dilated cardiomyopathy ([place]) 05/15/2020  . Fracture of pubic ramus ([place]) 04/15/2013  . GI bleed 05/15/2020  . Headache(784.0)   . Kidney stones   . Memory loss   . Mixed hyperlipidemia 05/15/2020  . Neck pain on left side 07/07/2020  . Peripheral arterial occlusive disease ([place]) 05/15/2020  . Pressure injury of skin 05/16/2020  . Stroke [facility])  PAST SURGICAL HISTORY:   Past Surgical History:  Procedure Laterality Date  . ABDOMINAL HYSTERECTOMY    . APPENDECTOMY    . COLONOSCOPY WITH PROPOFOL N/A 05/17/2020   Procedure: COLONOSCOPY WITH PROPOFOL;  Surgeon: Jerene Bears, MD;  Location: [facility];  Service: Gastroenterology;  Laterality: N/A;  . ESOPHAGOGASTRODUODENOSCOPY (EGD) WITH PROPOFOL N/A  05/17/2020   Procedure: ESOPHAGOGASTRODUODENOSCOPY (EGD) WITH PROPOFOL;  Surgeon: Jerene Bears, MD;  Location: [facility] ENDOSCOPY;  Service: Gastroenterology;  Laterality: N/A;  . HEMOSTASIS CLIP PLACEMENT  05/17/2020   Procedure: HEMOSTASIS CLIP PLACEMENT;  Surgeon: Jerene Bears, MD;  Location: [facility] ENDOSCOPY;  Service: Gastroenterology;;  . HOT HEMOSTASIS N/A 05/17/2020   Procedure: HOT HEMOSTASIS (ARGON PLASMA COAGULATION/BICAP);  Surgeon: Jerene Bears, MD;  Location: [facility] ENDOSCOPY;  Service: Gastroenterology;  Laterality: N/A;    SOCIAL HISTORY:   Social History   Tobacco Use  . Smoking status: Never Smoker  . Smokeless tobacco: Never Used  Substance Use Topics  . Alcohol use: No    FAMILY HISTORY:  Positive for CVA in her brother.  DRUG ALLERGIES:   Allergies  Allergen Reactions  . Ampicillin   . Demerol [Meperidine] Other (See Comments)    Unknown reaction    REVIEW OF SYSTEMS:   ROS As per history of present illness. All pertinent systems were reviewed above. Constitutional, HEENT, cardiovascular, respiratory, GI, GU, musculoskeletal, neuro, psychiatric, endocrine, integumentary and hematologic systems were reviewed and are otherwise negative/unremarkable except for positive findings mentioned above in the HPI.   MEDICATIONS AT HOME:   Prior to Admission medications   Medication Sig Start Date End Date Taking? Authorizing Provider  acetaminophen-codeine (TYLENOL #3) 300-30 MG tablet Take 1 tablet by mouth every 4 (four) hours as needed for moderate pain.   Yes [provider]  carvedilol (COREG) 3.125 MG tablet Take 3.125 mg by mouth 2 (two) times daily with a meal.   Yes [provider]  clopidogrel (PLAVIX) 75 MG tablet Take 75 mg by mouth daily.   Yes [provider]  furosemide (LASIX) 80 MG tablet Take 1 tablet (80 mg total) by mouth 2 (two) times daily. 07/12/20 08/11/20 Yes Lilland, Alana, DO  levothyroxine (SYNTHROID) 25 MCG tablet Take 25 mcg by  mouth daily. 03/06/20  Yes [provider]  pantoprazole (PROTONIX) 40 MG tablet Take 1 tablet (40 mg total) by mouth daily before breakfast. 05/20/20  Yes Mikhail, [place], DO  rosuvastatin (CRESTOR) 5 MG tablet Take 5 mg by mouth at bedtime. 08/31/15  Yes [provider]  sacubitril-valsartan (ENTRESTO) 24-26 MG Take 1 tablet by mouth at bedtime.   Yes [provider]      VITAL SIGNS:  Blood pressure 107/69, pulse 87, temperature 97.8 F (36.6 C), temperature source Oral, resp. rate 13, height 5' (1.524 m), weight 48 kg, SpO2 96 %.  PHYSICAL EXAMINATION:  Physical Exam  GENERAL:  87 y.o.-year-old African-American female patient lying in the bed with no acute distress.  EYES: Pupils equal, round, reactive to light and accommodation. No scleral icterus. Extraocular muscles intact.  HEENT: Head atraumatic, normocephalic. Oropharynx and nasopharynx clear.  NECK:  Supple, no jugular venous distention. No thyroid enlargement, no tenderness.  LUNGS: Diminished bibasal breath sounds with bibasal rales.  No use of accessory muscles of respiration.  CARDIOVASCULAR: Regular rate and rhythm, S1, S2 normal. No murmurs, rubs, or gallops.  ABDOMEN: Soft, nondistended, nontender. Bowel sounds present. No organomegaly or mass.  EXTREMITIES: 1+ bilateral lower extremity pitting edema  with no cyanosis, or clubbing.  NEUROLOGIC: Cranial nerves II through XII are intact. Muscle strength 5/5 in all extremities. Sensation intact. Gait not checked.  PSYCHIATRIC: The patient is alert and oriented x 3.  Normal affect and good eye contact. SKIN: No obvious rash, lesion, or ulcer.   LABORATORY PANEL:   CBC Recent Labs  Lab 07/28/20 2125  WBC 4.8  HGB 10.5*  HCT 32.9*  PLT 150   ------------------------------------------------------------------------------------------------------------------  Chemistries  Recent Labs  Lab 07/28/20 2125  NA 147*  K 3.7  CL 108  CO2 25   GLUCOSE 117*  BUN 51*  CREATININE 2.24*  CALCIUM 10.0  AST 26  ALT 12  ALKPHOS 191*  BILITOT 1.1   ------------------------------------------------------------------------------------------------------------------  Cardiac Enzymes No results for input(s): TROPONINI in the last 168 hours. ------------------------------------------------------------------------------------------------------------------  RADIOLOGY:  CT Abdomen Pelvis Wo Contrast  Result Date: 07/28/2020 CLINICAL DATA:  87 year old with abdominal distension and acute abdominal pain. Vomiting for 2 days. EXAM: CT ABDOMEN AND PELVIS WITHOUT CONTRAST TECHNIQUE: Multidetector CT imaging of the abdomen and pelvis was performed following the standard protocol without IV contrast. COMPARISON:  Right upper quadrant ultrasound 07/07/2020 FINDINGS: Lower chest: Moderate right pleural effusion partially included. Cardiomegaly. Decreased blood pool consistent with anemia. No pericardial effusion. Hepatobiliary: Nodular hepatic contours consistent with cirrhosis. No evidence of focal hepatic lesion on noncontrast exam. Unremarkable gallbladder. No calcified gallstone. Proximal common bile duct appears dilated of 15 mm, and there is tapering to the duodenal insertion. Pancreas: Mildly atrophic.  No ductal dilatation or inflammation. Spleen: No splenomegaly.  No focal splenic abnormality. Adrenals/Urinary Tract: No adrenal nodule. No hydronephrosis. Multiple bilateral renal cysts. There are also subcentimeter hyperdense lesions in the upper left and mid right kidney, likely hyperdense cysts. Possible punctate nonobstructing stone in the upper right kidney. No ureteral stone. Decompressed urinary bladder. Stomach/Bowel: Distended stomach with air-fluid level. There is mild pre pyloric gastric wall thickening. Decompressed small bowel without obstruction or inflammation. Appendix not visualized, prior appendectomy per history diverticulosis  throughout the entire colon, most prominent distally. No diverticulitis. No colonic inflammation. Vascular/Lymphatic: Moderately advanced aortic atherosclerosis. No aortic aneurysm. There are few calcified right upper quadrant lymph nodes suggesting prior granulomatous disease. No noncalcified adenopathy. Reproductive: Status post hysterectomy. No adnexal masses. Other: No significant intra-abdominal ascites. No free air or focal fluid collection. There is mild generalized body wall edema. No abdominal wall hernia. Musculoskeletal: Degenerative disc disease and facet hypertrophy in the spine. There are no acute or suspicious osseous abnormalities. IMPRESSION: 1. Mild pre pyloric gastric wall thickening may be related to peptic ulcer disease or gastritis. Mild gastric distension with fluid level. 2. Cirrhosis. Mild common bile duct dilatation which normally tapers at the duodenal insertion. 3. Moderate right pleural effusion partially included. Cardiomegaly. 4. Colonic diverticulosis without diverticulitis. 5. Possible punctate nonobstructing stone in the upper right kidney. 6. Bilateral renal cysts. Additional subcentimeter hyperdense lesions in both kidneys are likely hyperdense cysts, but are not well characterized on noncontrast exam. Aortic Atherosclerosis (ICD10-I70.0). Electronically Signed   By: Keith Rake M.D.   On: 07/28/2020 22:05   DG Chest Port 1 View  Result Date: 07/28/2020 CLINICAL DATA:  Vomiting EXAM: PORTABLE CHEST 1 VIEW COMPARISON:  07/05/2020 FINDINGS: Cardiomegaly, vascular congestion. Interstitial prominence may reflect mild interstitial edema. Small bilateral effusions suspected. No acute bony abnormality. IMPRESSION: Cardiomegaly with vascular congestion and probable mild interstitial edema. Electronically Signed   By: Rolm Baptise M.D.   On: 07/28/2020 21:51  IMPRESSION AND PLAN:  Active Problems:   Acute CHF ([place])  1.  Acute on chronic systolic CHF. -The patient  will be admitted to telemetry bed. -She will be diuresed with IV Lasix as much as her blood pressure will tolerate. -She had a 2D echo on 07/06/2020 revealing an EF of 20 to 25% with indeterminate diastolic function, moderate left atrial dilatation and mild right atrial dilatation with moderate tricuspid regurgitation. -We will obtain a cardiology consultation. -I discussed the case with Dr. Marcelle Smiling [facility] cardiology fellow) over the phone.  2.  Elevated troponin I. -This is likely demand ischemia. -We will need to rule out acute coronary syndrome, especially given her left neck pain that could be anginal. -We will follow serial troponin I's. -We will continue her Plavix, statin therapy and Coreg. -Aspirin is being held off given her acute gastritis.  3.  Acute kidney injury. -This could be prerenal secondary to CHF. -Nausea and vomiting could have contributed to her AKI. -We will monitor renal functions with diuresis. -We will hold off Entresto.  4.  Acute gastritis. -This would explain her recurrent nausea and vomiting. -The patient will be placed on IV PPI therapy.  5.  Hypothyroidism. -We will continue Synthroid and check TSH level.  6.  Dyslipidemia. -We will continue statin therapy.  DVT prophylaxis: Lovenox.   Code Status: The patient wants to be DNR/DNI.   Family Communication:  The plan of care was discussed in details with the patient (and family). I answered all questions. The patient agreed to proceed with the above mentioned plan. Further management will depend upon hospital course. Disposition Plan: Back to previous home environment Consults called: Cardiology consultation as mentioned above. All the records are reviewed and case discussed with ED provider.  Status is: Inpatient  Remains inpatient appropriate because:Ongoing diagnostic testing needed not appropriate for outpatient work up, Unsafe d/c plan, IV treatments appropriate due to intensity of illness or  inability to take PO and Inpatient level of care appropriate due to severity of illness   Dispo: The patient is from: SNF              Anticipated d/c is to: SNF              Patient currently is not medically stable to d/c.   Difficult to place patient No  TOTAL TIME TAKING CARE OF THIS PATIENT: 55 minutes.    Christel Mormon M.D on 07/29/2020 at 2:41 AM  [facility]   From 7 PM-7 AM, contact night-coverage [email or web address]  CC: Primary care physician; Andres Shad, MD

## 2020-07-29 NOTE — Progress Notes (Signed)
PROGRESS NOTE    Allison Gardner  [id number] DOB: 03/01/1923 DOA: 07/28/2020 PCP: Andres Shad, MD    Brief Narrative:  Allison Gardner is a 87 year old female with past medical history significant for chronic systolic congestive heart failure, CVA, PAD, colonic angiodysplasia, dilated cardiomyopathy, dyslipidemia, CKD stage IIIb, cognitive impairment who presented to ED with acute onset intractable nausea/vomiting, shortness of breath, worsening lower extremity edema.  Patient states onset of symptoms over the past few days with progression, reports up to 7 vomiting episodes per day.  Denies fever/chills, no melena or bright red blood per rectum, no dysuria or hematuria or flank pain.  Patient was recently discharged from [facility] following a CHF exacerbation on 07/05/2020 to home with outpatient palliative care.  In the ED, temperature 97.8 F, HR 100, RR 20, BP 108/64, SPO2 100% on room air.  Sodium 147, potassium 3.7, chloride 108, CO2 25, glucose 117, BUN 51, creatinine 2.24, AST 26, ALT 12, total bilirubin 1.1.  Troponin 91>89.  WBC 4.8, hemoglobin 10.5, platelets 150.  Lactic acid 1.4.  Chest x-ray with cardiomegaly with vascular congestion and probable mid interstitial edema.  EKG personally reviewed, NSR, rate 99, QTc 519, LBBB.  CT abdomen/pelvis with prepyloric gastric wall thickening consistent with GERD/gastritis with mild gastric distention, cirrhosis with mild common bile duct dilation, moderate right pleural effusion, cardiomegaly diverticulosis without diverticulitis, nonobstructing stone upper right kidney and bilateral renal cysts.  Patient was given 4 mg IV Zofran 40 mg IV Protonix in the ED.  EDP requested hospitalist admission for further evaluation and management.   Assessment & Plan:   Principal Problem:   Acute CHF ([place]) Active Problems:   Mild cognitive impairment   Chronic systolic heart failure (HCC)   GERD (gastroesophageal reflux disease)    Essential hypertension   Hypothyroidism   Thrombocytopenia (HCC)   Chronic kidney disease, stage 4 (severe) (HCC)   Acute on chronic systolic (congestive) heart failure (HCC)   Malnutrition of moderate degree   Frailty syndrome in geriatric patient   Acute on chronic systolic congestive heart failure, POA Patient presenting to the ED with progressive shortness of breath, orthopnea and increased lower extremity edema.  Recently discharged from [facility] on 07/05/2020 for CHF exacerbation.  TTE 07/06/2020 with LVEF 20-25%, LV severely decreased function with global hypokinesis, mild LVH, indeterminate diastolic parameters, LA moderately dilated, RA mildly dilated, severe MR. --Cardiology following, appreciate assistance --Furosemide 80 mg IV every 12 hours --Strict I's and O's and daily weights --Discontinue the Entresto given renal failure and CKD --Holding home carvedilol --Hydralazine 10 mg p.o. every 8 hours --Imdur 50 mg p.o. daily  --Per cardiology, not a candidate for advanced procedures or therapies given her advanced age currently followed by palliative care and DNR  Elevated troponin likely secondary to type II demand ischemia Troponin elevated 91, 89.  Etiology likely secondary to type II demand ischemia from a volume overload given acute CHF exacerbation as above as well as decreased elimination from acute renal failure. --Continue to monitor on telemetry  Acute renal failure on CKD stage IIIb Baseline creatinine 1.30-1.35 with most recent creatinine 1.56 on 07/05/2020 on discharge.  Patient's creatinine elevated 2.24 on admission, likely secondary to volume overload as above. --Cr 2.24>2.17 --Discontinued Entresto --Continue IV diuresis --Avoid nephrotoxins, renally dose all medications --Follow BMP daily  Intractable nausea/vomiting GERD/acute noninfectious gastritis CT abdomen/pelvis with prepyloric gastric wall thickening consistent with GERD/gastritis with mild gastric  distention.  Patient with reported intractable nausea/vomiting over  the past few days.  Patient is afebrile without leukocytosis, normal lactic acid, unlikely infectious etiology.  Etiology likely related to volume overload/CHF exacerbation as above as well as underlying reflux disease. --Protonix 40 mg IV every 12 hours --Zofran as needed --Clear liquid diet, advance as tolerates --Speech therapy evaluation  PAD/CAD Aortic atherosclerosis --Plavix 75 mg p.o. daily --Crestor 5 mg p.o. daily  Dyslipidemia: Continue statin  Hypothyroidism --Levothyroxine 25 mcg p.o. daily   DVT prophylaxis: Lovenox   Code Status: DNR Family Communication: Attempted to contact patient's son, [name] via telephone, went straight to voicemail.  Disposition Plan:  Level of care: Telemetry Status is: Inpatient  Remains inpatient appropriate because:Ongoing diagnostic testing needed not appropriate for outpatient work up, Unsafe d/c plan, IV treatments appropriate due to intensity of illness or inability to take PO and Inpatient level of care appropriate due to severity of illness   Dispo: The patient is from: Home              Anticipated d/c is to: Home              Patient currently is not medically stable to d/c.   Difficult to place patient No   Consultants:   Cardiology  Procedures:   None  Antimicrobials:   None   Subjective: Patient seen and examined bedside, continues in ED holding area.  Continues with mild nausea, no further vomiting.  Also with weakness/fatigue.  Reports continued mild shortness of breath and lower extremity edema.  No family present at bedside.  No other complaints or concerns at this time.  Denies headache, no fever/chills/night sweats, no vomiting/diarrhea, no chest pain, no palpitations, no abdominal pain.   Objective: Vitals:   07/29/20 1126 07/29/20 1128 07/29/20 1200 07/29/20 1226  BP: (!) 107/59  93/61 92/63  Pulse: 79  75 75  Resp: (!) 22  10   Temp:     (!) 97.4 F (36.3 C)  TempSrc:    Oral  SpO2: 100% 100% 99% 100%  Weight:      Height:        Intake/Output Summary (Last 24 hours) at 07/29/2020 1243 Last data filed at 07/29/2020 0115 Gross per 24 hour  Intake --  Output 50 ml  Net -50 ml   Filed Weights   07/28/20 2037  Weight: 48 kg    Examination:  General exam: Appears calm and comfortable, chronically ill/elderly in appearance Respiratory system: Breath sounds slightly decreased bilateral bases with mild crackles, normal respiratory effort, no accessory muscle use, oxygenating well on room air Cardiovascular system: S1 & S2 heard, RRR. No JVD, murmurs, rubs, gallops or clicks.  Bilateral pitting edema to knees Gastrointestinal system: Abdomen is nondistended, soft and nontender. No organomegaly or masses felt. Normal bowel sounds heard. Central nervous system: Alert and oriented. No focal neurological deficits. Extremities: Symmetric 5 x 5 power. Skin: No rashes, lesions or ulcers Psychiatry: Judgement and insight appear normal. Mood & affect appropriate.     Data Reviewed: I have personally reviewed following labs and imaging studies  CBC: Recent Labs  Lab 07/28/20 2125 07/29/20 0440  WBC 4.8 3.4*  NEUTROABS 3.8  --   HGB 10.5* 9.4*  HCT 32.9* 29.4*  MCV 94.3 96.4  PLT 150 [id number]*   Basic Metabolic Panel: Recent Labs  Lab 07/28/20 2125 07/29/20 0440  NA 147* 146*  K 3.7 5.1  CL 108 110  CO2 25 25  GLUCOSE 117* 86  BUN 51* 50*  CREATININE 2.24*  2.17*  CALCIUM 10.0 9.1   GFR: Estimated Creatinine Clearance: 11.1 mL/min (A) (by C-G formula based on SCr of 2.17 mg/dL (H)). Liver Function Tests: Recent Labs  Lab 07/28/20 2125  AST 26  ALT 12  ALKPHOS 191*  BILITOT 1.1  PROT 8.0  ALBUMIN 4.1   No results for input(s): LIPASE, AMYLASE in the last 168 hours. No results for input(s): AMMONIA in the last 168 hours. Coagulation Profile: Recent Labs  Lab 07/28/20 2125  INR 1.1   Cardiac  Enzymes: No results for input(s): CKTOTAL, CKMB, CKMBINDEX, TROPONINI in the last 168 hours. BNP (last 3 results) No results for input(s): PROBNP in the last 8760 hours. HbA1C: No results for input(s): HGBA1C in the last 72 hours. CBG: No results for input(s): GLUCAP in the last 168 hours. Lipid Profile: No results for input(s): CHOL, HDL, LDLCALC, TRIG, CHOLHDL, LDLDIRECT in the last 72 hours. Thyroid Function Tests: No results for input(s): TSH, T4TOTAL, FREET4, T3FREE, THYROIDAB in the last 72 hours. Anemia Panel: No results for input(s): VITAMINB12, FOLATE, FERRITIN, TIBC, IRON, RETICCTPCT in the last 72 hours. Sepsis Labs: Recent Labs  Lab 07/28/20 2125  LATICACIDVEN 1.4    No results found for this or any previous visit (from the past 240 hour(s)).       Radiology Studies: CT Abdomen Pelvis Wo Contrast  Result Date: 07/28/2020 CLINICAL DATA:  87 year old with abdominal distension and acute abdominal pain. Vomiting for 2 days. EXAM: CT ABDOMEN AND PELVIS WITHOUT CONTRAST TECHNIQUE: Multidetector CT imaging of the abdomen and pelvis was performed following the standard protocol without IV contrast. COMPARISON:  Right upper quadrant ultrasound 07/07/2020 FINDINGS: Lower chest: Moderate right pleural effusion partially included. Cardiomegaly. Decreased blood pool consistent with anemia. No pericardial effusion. Hepatobiliary: Nodular hepatic contours consistent with cirrhosis. No evidence of focal hepatic lesion on noncontrast exam. Unremarkable gallbladder. No calcified gallstone. Proximal common bile duct appears dilated of 15 mm, and there is tapering to the duodenal insertion. Pancreas: Mildly atrophic.  No ductal dilatation or inflammation. Spleen: No splenomegaly.  No focal splenic abnormality. Adrenals/Urinary Tract: No adrenal nodule. No hydronephrosis. Multiple bilateral renal cysts. There are also subcentimeter hyperdense lesions in the upper left and mid right kidney,  likely hyperdense cysts. Possible punctate nonobstructing stone in the upper right kidney. No ureteral stone. Decompressed urinary bladder. Stomach/Bowel: Distended stomach with air-fluid level. There is mild pre pyloric gastric wall thickening. Decompressed small bowel without obstruction or inflammation. Appendix not visualized, prior appendectomy per history diverticulosis throughout the entire colon, most prominent distally. No diverticulitis. No colonic inflammation. Vascular/Lymphatic: Moderately advanced aortic atherosclerosis. No aortic aneurysm. There are few calcified right upper quadrant lymph nodes suggesting prior granulomatous disease. No noncalcified adenopathy. Reproductive: Status post hysterectomy. No adnexal masses. Other: No significant intra-abdominal ascites. No free air or focal fluid collection. There is mild generalized body wall edema. No abdominal wall hernia. Musculoskeletal: Degenerative disc disease and facet hypertrophy in the spine. There are no acute or suspicious osseous abnormalities. IMPRESSION: 1. Mild pre pyloric gastric wall thickening may be related to peptic ulcer disease or gastritis. Mild gastric distension with fluid level. 2. Cirrhosis. Mild common bile duct dilatation which normally tapers at the duodenal insertion. 3. Moderate right pleural effusion partially included. Cardiomegaly. 4. Colonic diverticulosis without diverticulitis. 5. Possible punctate nonobstructing stone in the upper right kidney. 6. Bilateral renal cysts. Additional subcentimeter hyperdense lesions in both kidneys are likely hyperdense cysts, but are not well characterized on noncontrast exam. Aortic Atherosclerosis (ICD10-I70.0). Electronically  Signed   By: Keith Rake M.D.   On: 07/28/2020 22:05   DG Chest Port 1 View  Result Date: 07/28/2020 CLINICAL DATA:  Vomiting EXAM: PORTABLE CHEST 1 VIEW COMPARISON:  07/05/2020 FINDINGS: Cardiomegaly, vascular congestion. Interstitial prominence may  reflect mild interstitial edema. Small bilateral effusions suspected. No acute bony abnormality. IMPRESSION: Cardiomegaly with vascular congestion and probable mild interstitial edema. Electronically Signed   By: Rolm Baptise M.D.   On: 07/28/2020 21:51        Scheduled Meds: . clopidogrel  75 mg Oral Daily  . enoxaparin (LOVENOX) injection  30 mg Subcutaneous Q24H  . furosemide  80 mg Intravenous Q12H  . hydrALAZINE  10 mg Oral Q8H  . isosorbide mononitrate  15 mg Oral Daily  . levothyroxine  25 mcg Oral Daily  . pantoprazole (PROTONIX) IV  40 mg Intravenous Q12H  . rosuvastatin  5 mg Oral QHS  . sodium chloride flush  3 mL Intravenous Q12H   Continuous Infusions: . sodium chloride       LOS: 0 days    Time spent: 39 minutes spent on chart review, discussion with nursing staff, consultants, updating family and interview/physical exam; more than 50% of that time was spent in counseling and/or coordination of care.    Lacretia Tindall J [place] DO [facility] Available via Epic secure chat 7am-7pm After these hours, please refer to coverage provider listed on [email or web address] 07/29/2020, 12:43 PM

## 2020-07-30 DIAGNOSIS — N179 Acute kidney failure, unspecified: Secondary | ICD-10-CM

## 2020-07-30 DIAGNOSIS — I10 Essential (primary) hypertension: Secondary | ICD-10-CM

## 2020-07-30 DIAGNOSIS — I5023 Acute on chronic systolic (congestive) heart failure: Secondary | ICD-10-CM

## 2020-07-30 LAB — BASIC METABOLIC PANEL
Anion gap: 13 (ref 5–15)
BUN: 49 mg/dL — ABNORMAL HIGH (ref 8–23)
CO2: 26 mmol/L (ref 22–32)
Calcium: 9.4 mg/dL (ref 8.9–10.3)
Chloride: 108 mmol/L (ref 98–111)
Creatinine, Ser: 2.24 mg/dL — ABNORMAL HIGH (ref 0.44–1.00)
GFR, Estimated: 20 mL/min — ABNORMAL LOW (ref >60)
Glucose, Bld: 67 mg/dL — ABNORMAL LOW (ref 70–99)
Potassium: 3.9 mmol/L (ref 3.5–5.1)
Sodium: 147 mmol/L — ABNORMAL HIGH (ref 135–145)

## 2020-07-30 LAB — BRAIN NATRIURETIC PEPTIDE: B Natriuretic Peptide: 4457.9 pg/mL — ABNORMAL HIGH (ref 0.0–100.0)

## 2020-07-30 LAB — MAGNESIUM: Magnesium: 2.2 mg/dL (ref 1.7–2.4)

## 2020-07-30 NOTE — Evaluation (Signed)
Clinical/Bedside Swallow Evaluation Patient Details  Name: Allison Gardner MRN: [id number] Date of Birth: 1922-06-28  Today's Date: 07/30/2020 Time: SLP Start Time (ACUTE ONLY): 0830 SLP Stop Time (ACUTE ONLY): 0930 SLP Time Calculation (min) (ACUTE ONLY): 60 min  Past Medical History:  Past Medical History:  Diagnosis Date  . Abnormal CT scan, colon   . Acute blood loss anemia 05/15/2020  . Acute GI bleeding 05/15/2020  . Acute on chronic congestive heart failure ([place])   . Angiodysplasia of colon with hemorrhage   . Cervical spondylosis 07/07/2020  . CHF (congestive heart failure) (HCC)    EF: 30-35% in 9/21  . Chronic anticoagulation 05/15/2020  . Chronic combined systolic and diastolic congestive heart failure ([place]) 05/15/2020  . Chronic systolic heart failure ([place]) 05/15/2020  . Dilated cardiomyopathy ([place]) 05/15/2020  . Fracture of pubic ramus ([place]) 04/15/2013  . GI bleed 05/15/2020  . Headache(784.0)   . Kidney stones   . Memory loss   . Mixed hyperlipidemia 05/15/2020  . Neck pain on left side 07/07/2020  . Peripheral arterial occlusive disease ([place]) 05/15/2020  . Pressure injury of skin 05/16/2020  . Stroke [facility])    Past Surgical History:  Past Surgical History:  Procedure Laterality Date  . ABDOMINAL HYSTERECTOMY    . APPENDECTOMY    . COLONOSCOPY WITH PROPOFOL N/A 05/17/2020   Procedure: COLONOSCOPY WITH PROPOFOL;  Surgeon: Jerene Bears, MD;  Location: [place];  Service: Gastroenterology;  Laterality: N/A;  . ESOPHAGOGASTRODUODENOSCOPY (EGD) WITH PROPOFOL N/A 05/17/2020   Procedure: ESOPHAGOGASTRODUODENOSCOPY (EGD) WITH PROPOFOL;  Surgeon: Jerene Bears, MD;  Location: [facility] ENDOSCOPY;  Service: Gastroenterology;  Laterality: N/A;  . HEMOSTASIS CLIP PLACEMENT  05/17/2020   Procedure: HEMOSTASIS CLIP PLACEMENT;  Surgeon: Jerene Bears, MD;  Location: [place] ENDOSCOPY;  Service: Gastroenterology;;  . HOT HEMOSTASIS N/A 05/17/2020   Procedure: HOT HEMOSTASIS (ARGON PLASMA COAGULATION/BICAP);   Surgeon: Jerene Bears, MD;  Location: [facility] ENDOSCOPY;  Service: Gastroenterology;  Laterality: N/A;   HPI:  Patient is a 87 y.o. female with PMH: PAD, colonic angiodysplasia with h/o GI bleeding, CVA, CHF, dilated cardiomyopathy, dyslipidemia, cognitive impairment, who presented to ER with acute onset intractiable nausea and vomiting for a couple of days prior with mild generalized abdominal pain and worsening LE edema. Patient admitted 7 vomiting episdoes per day with occasional dark green emesis. CXR revealed cardiomegaly with vascular congestion and probable mid interstitial edema. CT abdomen/pelvis showed prepyloric gastric wall thickening consistent with GERD/gastritis with mild gastric distention, cirrhosis with mild common bile duct dilation, moderate right pleural effusion, cardiomegaly diverticulosis without diverticulitis, nonobstructing stone upper right kidney and bilateral renal cysts.   Assessment / Plan / Recommendation Clinical Impression  Patient presents with a mild oropharyngeal dysphagia with likely esophageal component secondary to diagnosis of GERD. She exhibited delayed masitcation of solids with trace to mild oral residuals post PO intake (corn flakes softened with milk) which cleared with cued swish and spit of water after intake. Patient endorsed a globus sensation when taking small whole pills with water. No coughing, throat clearing or other overt s/s aspiration or penetration were observed at bedside. SLP to f/u with patient for diet toleration and determination of ability to tolerate upgraded solids. SLP Visit Diagnosis: Dysphagia, unspecified (R13.10)    Aspiration Risk  Mild aspiration risk    Diet Recommendation Dysphagia 2 (Fine chop);Thin liquid   Liquid Administration via: Cup;Straw Medication Administration: Whole meds with liquid Supervision: Patient able to self feed;Intermittent supervision to cue  for compensatory strategies Compensations: Minimize  environmental distractions;Slow rate;Small sips/bites;Other (Comment) (oral care after PO's) Postural Changes: Seated upright at 90 degrees;Remain upright for at least 30 minutes after po intake    Other  Recommendations Oral Care Recommendations: Oral care BID;Staff/trained caregiver to provide oral care   Follow up Recommendations None      Frequency and Duration min 1 x/week  1 week       Prognosis Prognosis for Safe Diet Advancement: Good      Swallow Study   General Date of Onset: 07/29/20 HPI: Patient is a 87 y.o. female with PMH: PAD, colonic angiodysplasia with h/o GI bleeding, CVA, CHF, dilated cardiomyopathy, dyslipidemia, cognitive impairment, who presented to ER with acute onset intractiable nausea and vomiting for a couple of days prior with mild generalized abdominal pain and worsening LE edema. Patient admitted 7 vomiting episdoes per day with occasional dark green emesis. CXR revealed cardiomegaly with vascular congestion and probable mid interstitial edema. CT abdomen/pelvis showed prepyloric gastric wall thickening consistent with GERD/gastritis with mild gastric distention, cirrhosis with mild common bile duct dilation, moderate right pleural effusion, cardiomegaly diverticulosis without diverticulitis, nonobstructing stone upper right kidney and bilateral renal cysts. Type of Study: Bedside Swallow Evaluation Previous Swallow Assessment: None found Diet Prior to this Study: Thin liquids;Other (Comment) (full liquids) Temperature Spikes Noted: No Respiratory Status: Room air History of Recent Intubation: No Behavior/Cognition: Alert;Cooperative;Pleasant mood;Confused Oral Cavity Assessment: Within Functional Limits Oral Cavity - Dentition: Missing dentition;Other (Comment) (missing most of top teeth but has majority of bottom teeth) Vision: Functional for self-feeding Self-Feeding Abilities: Needs set up;Able to feed self Patient Positioning: Upright in bed Baseline  Vocal Quality: Normal Volitional Cough: Weak Volitional Swallow: Unable to elicit    Oral/Motor/Sensory Function Overall Oral Motor/Sensory Function: Within functional limits   Ice Chips     Thin Liquid Thin Liquid: Within functional limits Presentation: Straw;Self Fed    Nectar Thick     Honey Thick     Puree Puree: Not tested   Solid     Solid: Impaired Oral Phase Functional Implications: Oral residue;Impaired mastication;Prolonged oral transit Pharyngeal Phase Impairments: Other (comments) (no overt s/s)      Sonia Baller, MA, CCC-SLP Speech Therapy

## 2020-07-30 NOTE — Evaluation (Signed)
Physical Therapy Evaluation Patient Details Name: [place] Gardner MRN: [id number] DOB: 1922-08-10 Today's Date: 07/30/2020   History of Present Illness  87 year old female with past medical history significant for chronic systolic congestive heart failure, CVA, PAD, colonic angiodysplasia, dilated cardiomyopathy, dyslipidemia, CKD stage IIIb, cognitive impairment who presented to ED with acute onset intractable nausea/vomiting, shortness of breath, worsening lower extremity edema.  Patient states onset of symptoms over the past few days with progression, reports up to 7 vomiting episodes per day.  pt with recent admission to [facility] in Feb for CHF and neck pain  Clinical Impression  Pt admitted with above diagnosis.  Pt agreeable to amb in room distance. Family concerned about pt's cervical pain and state they have made RN aware. Recommend HHPT vs OPPT to address cervical pain. Will follow in acute setting.  Pt currently with functional limitations due to the deficits listed below (see PT Problem List). Pt will benefit from skilled PT to increase their independence and safety with mobility to allow discharge to the venue listed below.       Follow Up Recommendations Home health PT;Supervision/Assistance - 24 hour    Equipment Recommendations  None recommended by PT    Recommendations for Other Services       Precautions / Restrictions Precautions Precautions: Fall Restrictions Weight Bearing Restrictions: No      Mobility  Bed Mobility Overal bed mobility: Needs Assistance Bed Mobility: Sit to Supine       Sit to supine: Min assist   General bed mobility comments: assist to bring bil LEs on to bed    Transfers Overall transfer level: Needs assistance Equipment used: Rolling walker (2 wheeled) Transfers: Sit to/from Stand Sit to Stand: Min guard         General transfer comment: for safety and transition to RW  Ambulation/Gait Ambulation/Gait assistance: Min  guard Gait Distance (Feet): 20 Feet Assistive device: Rolling walker (2 wheeled) Gait Pattern/deviations: Step-through pattern;Decreased stride length;Trunk flexed     General Gait Details: min/guard for safety, pt amb in room-declined greater distance attempt d/t fatigue. no overt LOB  Stairs            Wheelchair Mobility    Modified Rankin (Stroke Patients Only)       Balance   Sitting-balance support: Feet supported;No upper extremity supported Sitting balance-Leahy Scale: Fair       Standing balance-Leahy Scale: Fair Standing balance comment: reliant on UEs for dynamic tasks                             Pertinent Vitals/Pain Pain Assessment: Faces Faces Pain Scale: Hurts even more Pain Location: end of session c/o L lateral cervical pain with movement Pain Descriptors / Indicators: Grimacing Pain Intervention(s): Limited activity within patient's tolerance;Monitored during session;Repositioned    Home Living Family/patient expects to be discharged to:: Private residence Living Arrangements: Children;Other relatives Available Help at Discharge: Family;Available 24 hours/day;Other (Comment) Type of Home: House Home Access: Stairs to enter Entrance Stairs-Rails: None Entrance Stairs-Number of Steps: 2   Home Equipment: Clinical cytogeneticist - 2 wheels;Cane - single point      Prior Function     Gait / Transfers Assistance Needed: ambulates with cane/RW for household distances  ADL's / Homemaking Assistance Needed: Daughter helps with tub transfers. bathes and dresses herself, fixes coffee family provides for the rest of her iADLs  Comments: enjoys sewing and cooking     Hand  Dominance        Extremity/Trunk Assessment   Upper Extremity Assessment Upper Extremity Assessment: Defer to OT evaluation;Generalized weakness    Lower Extremity Assessment Lower Extremity Assessment: Generalized weakness       Communication   Communication:  No difficulties  Cognition Arousal/Alertness: Awake/alert Behavior During Therapy: WFL for tasks assessed/performed Overall Cognitive Status: History of cognitive impairments - at baseline                                        General Comments      Exercises     Assessment/Plan    PT Assessment Patient needs continued PT services  PT Problem List Decreased strength;Decreased balance;Decreased mobility;Decreased knowledge of use of DME;Decreased safety awareness;Decreased knowledge of precautions;Decreased activity tolerance       PT Treatment Interventions DME instruction;Gait training;Functional mobility training;Therapeutic activities;Therapeutic exercise;Balance training;Stair training;Patient/family education    PT Goals (Current goals can be found in the Care Plan section)  Acute Rehab PT Goals Patient Stated Goal: go home, family  would like pt's cervical pain addressed PT Goal Formulation: With patient/family Time For Goal Achievement: 08/13/20 Potential to Achieve Goals: Good    Frequency Min 3X/week   Barriers to discharge        Co-evaluation               AM-PAC PT "6 Clicks" Mobility  Outcome Measure Help needed turning from your back to your side while in a flat bed without using bedrails?: A Little Help needed moving from lying on your back to sitting on the side of a flat bed without using bedrails?: A Little Help needed moving to and from a bed to a chair (including a wheelchair)?: A Little Help needed standing up from a chair using your arms (e.g., wheelchair or bedside chair)?: A Little Help needed to walk in hospital room?: A Little Help needed climbing 3-5 steps with a railing? : A Lot 6 Click Score: 17    End of Session Equipment Utilized During Treatment: Gait belt Activity Tolerance: Patient tolerated treatment well Patient left: in bed;with call bell/phone within reach;with bed alarm set;with family/visitor present    PT Visit Diagnosis: Unsteadiness on feet (R26.81);Muscle weakness (generalized) (M62.81)    Time: [id number] PT Time Calculation (min) (ACUTE ONLY): 18 min   Charges:   PT Evaluation $PT Eval Low Complexity: [place], PT  Acute Rehab Dept ([place]) [phone] Pager [phone]  07/30/2020   [facility] 07/30/2020, 3:41 PM

## 2020-07-30 NOTE — Progress Notes (Addendum)
Complains of neck pain that radiates to left shoulder. Made MD aware. Jerene Pitch

## 2020-07-30 NOTE — Progress Notes (Signed)
PROGRESS NOTE    Allison Gardner  [id number] DOB: [birth year] DOA: 07/28/2020 PCP: Andres Shad, MD    Brief Narrative:  Allison Gardner is a 87 year old female with past medical history significant for chronic systolic congestive heart failure, CVA, PAD, colonic angiodysplasia, dilated cardiomyopathy, dyslipidemia, CKD stage IIIb, cognitive impairment who presented to ED with acute onset intractable nausea/vomiting, shortness of breath, worsening lower extremity edema.  Patient states onset of symptoms over the past few days with progression, reports up to 7 vomiting episodes per day.  Denies fever/chills, no melena or bright red blood per rectum, no dysuria or hematuria or flank pain.  Patient was recently discharged from [facility] following a CHF exacerbation on 07/05/2020 to home with outpatient palliative care.  In the ED, temperature 97.8 F, HR 100, RR 20, BP 108/64, SPO2 100% on room air.  Sodium 147, potassium 3.7, chloride 108, CO2 25, glucose 117, BUN 51, creatinine 2.24, AST 26, ALT 12, total bilirubin 1.1.  Troponin 91>89.  WBC 4.8, hemoglobin 10.5, platelets 150.  Lactic acid 1.4.  Chest x-ray with cardiomegaly with vascular congestion and probable mid interstitial edema.  EKG personally reviewed, NSR, rate 99, QTc 519, LBBB.  CT abdomen/pelvis with prepyloric gastric wall thickening consistent with GERD/gastritis with mild gastric distention, cirrhosis with mild common bile duct dilation, moderate right pleural effusion, cardiomegaly diverticulosis without diverticulitis, nonobstructing stone upper right kidney and bilateral renal cysts.  Patient was given 4 mg IV Zofran 40 mg IV Protonix in the ED.  EDP requested hospitalist admission for further evaluation and management.   Assessment & Plan:   Principal Problem:   Acute CHF ([place]) Active Problems:   Mild cognitive impairment   Chronic systolic heart failure (HCC)   GERD (gastroesophageal reflux disease)    Essential hypertension   Hypothyroidism   Thrombocytopenia (HCC)   Chronic kidney disease, stage 4 (severe) (HCC)   Acute on chronic systolic (congestive) heart failure (HCC)   Malnutrition of moderate degree   Frailty syndrome in geriatric patient   Acute on chronic systolic congestive heart failure, POA Patient presenting to the ED with progressive shortness of breath, orthopnea and increased lower extremity edema.  Recently discharged from [facility] on 07/05/2020 for CHF exacerbation.  TTE 07/06/2020 with LVEF 20-25%, LV severely decreased function with global hypokinesis, mild LVH, indeterminate diastolic parameters, LA moderately dilated, RA mildly dilated, severe MR. --Cardiology following, appreciate assistance --net negative 1.1L past 24h and net negative 1.15L since admission --Furosemide 80 mg IV q12h --Strict I's and O's and daily weights --Discontinued Entresto given renal failure and CKD --Holding home carvedilol --Hydralazine 10 mg p.o. every 8 hours --Imdur 50 mg p.o. daily  --Per cardiology, not a candidate for advanced procedures or therapies given her advanced age currently followed by palliative care and DNR --BMP daily  Elevated troponin likely secondary to type II demand ischemia Troponin elevated 91, 89.  Etiology likely secondary to type II demand ischemia from a volume overload given acute CHF exacerbation as above as well as decreased elimination from acute renal failure. --Continue to monitor on telemetry  Acute renal failure on CKD stage IIIb Baseline creatinine 1.30-1.35 with most recent creatinine 1.56 on 07/05/2020 on discharge.  Patient's creatinine elevated 2.24 on admission, likely secondary to volume overload as above. --Cr 2.24>2.17>2.24 --Discontinued Entresto --Continue IV diuresis --Avoid nephrotoxins, renally dose all medications --Follow BMP daily  Intractable nausea/vomiting GERD/acute noninfectious gastritis CT abdomen/pelvis with prepyloric  gastric wall thickening consistent with GERD/gastritis with  mild gastric distention.  Patient with reported intractable nausea/vomiting over the past few days.  Patient is afebrile without leukocytosis, normal lactic acid, unlikely infectious etiology.  Etiology likely related to volume overload/CHF exacerbation as above as well as underlying reflux disease.  Seen by speech therapy with recommendation of a dysphagia 2/fine chopped diet with thin liquids. --Protonix 40 mg IV q12h --Zofran as needed --Dysphagia 2 diet with thin liquids  PAD/CAD Aortic atherosclerosis --Plavix 75 mg p.o. daily --Crestor 5 mg p.o. daily  Dyslipidemia: Continue statin  Hypothyroidism --Levothyroxine 25 mcg p.o. daily  Weakness/deconditioning/debility: --PT/OT evaluation: Pending   DVT prophylaxis: Lovenox   Code Status: DNR Family Communication: Attempted to contact patient's son, [name] via telephone, went straight to voicemail.   Disposition Plan:  Level of care: Telemetry Status is: Inpatient  Remains inpatient appropriate because:Ongoing diagnostic testing needed not appropriate for outpatient work up, Unsafe d/c plan, IV treatments appropriate due to intensity of illness or inability to take PO and Inpatient level of care appropriate due to severity of illness   Dispo: The patient is from: Home              Anticipated d/c is to: Home              Patient currently is not medically stable to d/c.   Difficult to place patient No   Consultants:   Cardiology  Procedures:   None  Antimicrobials:   None   Subjective: Patient seen and examined bedside, no specific complaints this morning.  Nursing and speech therapy at bedside.  Denies chest pain or shortness of breath.  No nausea/vomiting/diarrhea, no headache, no abdominal pain.  No other concerns or questions at this time.  No acute concerns overnight per nursing staff.  Objective: Vitals:   07/29/20 2030 07/30/20 0055 07/30/20 0438  07/30/20 0438  BP: 118/84 116/77  106/62  Pulse: 86 98  85  Resp:  18  14  Temp: 99 F (37.2 C) 98 F (36.7 C)  97.9 F (36.6 C)  TempSrc: Oral Oral  Oral  SpO2: 100% 93%  93%  Weight:   48.1 kg   Height:        Intake/Output Summary (Last 24 hours) at 07/30/2020 1234 Last data filed at 07/30/2020 0848 Gross per 24 hour  Intake 243 ml  Output 1100 ml  Net -857 ml   Filed Weights   07/28/20 2037 07/30/20 0438  Weight: 48 kg 48.1 kg    Examination:  General exam: Appears calm and comfortable, chronically ill/elderly in appearance Respiratory system: Breath sounds slightly decreased bilateral bases with mild crackles, normal respiratory effort, no accessory muscle use, oxygenating well on room air Cardiovascular system: S1 & S2 heard, RRR. No JVD, murmurs, rubs, gallops or clicks.  Bilateral pitting edema to knees Gastrointestinal system: Abdomen is nondistended, soft and nontender. No organomegaly or masses felt. Normal bowel sounds heard. Central nervous system: Alert and oriented. No focal neurological deficits. Extremities: Symmetric 5 x 5 power. Skin: No rashes, lesions or ulcers Psychiatry: Judgement and insight appear normal. Mood & affect appropriate.     Data Reviewed: I have personally reviewed following labs and imaging studies  CBC: Recent Labs  Lab 07/28/20 2125 07/29/20 0440  WBC 4.8 3.4*  NEUTROABS 3.8  --   HGB 10.5* 9.4*  HCT 32.9* 29.4*  MCV 94.3 96.4  PLT 150 [id number]*   Basic Metabolic Panel: Recent Labs  Lab 07/28/20 2125 07/29/20 0440 07/30/20 0414  NA 147* 146*  147*  K 3.7 5.1 3.9  CL 108 110 108  CO2 '25 25 26  '$ GLUCOSE 117* 86 67*  BUN 51* 50* 49*  CREATININE 2.24* 2.17* 2.24*  CALCIUM 10.0 9.1 9.4  MG  --   --  2.2   GFR: Estimated Creatinine Clearance: 10.8 mL/min (A) (by C-G formula based on SCr of 2.24 mg/dL (H)). Liver Function Tests: Recent Labs  Lab 07/28/20 2125  AST 26  ALT 12  ALKPHOS 191*  BILITOT 1.1  PROT 8.0   ALBUMIN 4.1   No results for input(s): LIPASE, AMYLASE in the last 168 hours. No results for input(s): AMMONIA in the last 168 hours. Coagulation Profile: Recent Labs  Lab 07/28/20 2125  INR 1.1   Cardiac Enzymes: No results for input(s): CKTOTAL, CKMB, CKMBINDEX, TROPONINI in the last 168 hours. BNP (last 3 results) No results for input(s): PROBNP in the last 8760 hours. HbA1C: No results for input(s): HGBA1C in the last 72 hours. CBG: No results for input(s): GLUCAP in the last 168 hours. Lipid Profile: No results for input(s): CHOL, HDL, LDLCALC, TRIG, CHOLHDL, LDLDIRECT in the last 72 hours. Thyroid Function Tests: No results for input(s): TSH, T4TOTAL, FREET4, T3FREE, THYROIDAB in the last 72 hours. Anemia Panel: No results for input(s): VITAMINB12, FOLATE, FERRITIN, TIBC, IRON, RETICCTPCT in the last 72 hours. Sepsis Labs: Recent Labs  Lab 07/28/20 2125  LATICACIDVEN 1.4    Recent Results (from the past 240 hour(s))  Urine culture     Status: None (Preliminary result)   Collection Time: 07/29/20  1:14 AM   Specimen: Urine, Random  Result Value Ref Range Status   Specimen Description   Final    URINE, RANDOM Performed at [facility] [address], [place], [place] [address]    Special Requests   Final    NONE Performed at [facility], [place] [address] [place], [place] [address]    Culture   Final    CULTURE REINCUBATED FOR BETTER GROWTH Performed at [facility], [place] [address] [place], [place] [address]    Report Status PENDING  Incomplete  SARS CORONAVIRUS 2 (TAT 6-24 HRS) Nasopharyngeal Nasopharyngeal Swab     Status: None   Collection Time: 07/29/20  2:10 AM   Specimen: Nasopharyngeal Swab  Result Value Ref Range Status   SARS Coronavirus 2 NEGATIVE NEGATIVE Final    Comment: (NOTE) SARS-CoV-2 target nucleic acids are NOT DETECTED.  The SARS-CoV-2 RNA is generally detectable in upper and  lower respiratory specimens during the acute phase of infection. Negative results do not preclude SARS-CoV-2 infection, do not rule out co-infections with other pathogens, and should not be used as the sole basis for treatment or other patient management decisions. Negative results must be combined with clinical observations, patient history, and epidemiological information. The expected result is Negative.  Fact Sheet for Patients: SugarRoll.be  Fact Sheet for Healthcare Providers: https://www.woods-mathews.com/  This test is not yet approved or cleared by the [place] FDA and  has been authorized for detection and/or diagnosis of SARS-CoV-2 by FDA under an Emergency Use Authorization (EUA). This EUA will remain  in effect (meaning this test can be used) for the duration of the COVID-19 declaration under Se ction 564(b)(1) of the Act, 21 U.S.C. section 360bbb-3(b)(1), unless the authorization is terminated or revoked sooner.  Performed at [facility], [place] [address], [place], [place] [address]          Radiology  Studies: CT Abdomen Pelvis Wo Contrast  Result Date: 07/28/2020 CLINICAL DATA:  87 year old with abdominal distension and acute abdominal pain. Vomiting for 2 days. EXAM: CT ABDOMEN AND PELVIS WITHOUT CONTRAST TECHNIQUE: Multidetector CT imaging of the abdomen and pelvis was performed following the standard protocol without IV contrast. COMPARISON:  Right upper quadrant ultrasound 07/07/2020 FINDINGS: Lower chest: Moderate right pleural effusion partially included. Cardiomegaly. Decreased blood pool consistent with anemia. No pericardial effusion. Hepatobiliary: Nodular hepatic contours consistent with cirrhosis. No evidence of focal hepatic lesion on noncontrast exam. Unremarkable gallbladder. No calcified gallstone. Proximal common bile duct appears dilated of 15 mm, and there is tapering to the duodenal insertion.  Pancreas: Mildly atrophic.  No ductal dilatation or inflammation. Spleen: No splenomegaly.  No focal splenic abnormality. Adrenals/Urinary Tract: No adrenal nodule. No hydronephrosis. Multiple bilateral renal cysts. There are also subcentimeter hyperdense lesions in the upper left and mid right kidney, likely hyperdense cysts. Possible punctate nonobstructing stone in the upper right kidney. No ureteral stone. Decompressed urinary bladder. Stomach/Bowel: Distended stomach with air-fluid level. There is mild pre pyloric gastric wall thickening. Decompressed small bowel without obstruction or inflammation. Appendix not visualized, prior appendectomy per history diverticulosis throughout the entire colon, most prominent distally. No diverticulitis. No colonic inflammation. Vascular/Lymphatic: Moderately advanced aortic atherosclerosis. No aortic aneurysm. There are few calcified right upper quadrant lymph nodes suggesting prior granulomatous disease. No noncalcified adenopathy. Reproductive: Status post hysterectomy. No adnexal masses. Other: No significant intra-abdominal ascites. No free air or focal fluid collection. There is mild generalized body wall edema. No abdominal wall hernia. Musculoskeletal: Degenerative disc disease and facet hypertrophy in the spine. There are no acute or suspicious osseous abnormalities. IMPRESSION: 1. Mild pre pyloric gastric wall thickening may be related to peptic ulcer disease or gastritis. Mild gastric distension with fluid level. 2. Cirrhosis. Mild common bile duct dilatation which normally tapers at the duodenal insertion. 3. Moderate right pleural effusion partially included. Cardiomegaly. 4. Colonic diverticulosis without diverticulitis. 5. Possible punctate nonobstructing stone in the upper right kidney. 6. Bilateral renal cysts. Additional subcentimeter hyperdense lesions in both kidneys are likely hyperdense cysts, but are not well characterized on noncontrast exam. Aortic  Atherosclerosis (ICD10-I70.0). Electronically Signed   By: Keith Rake M.D.   On: 07/28/2020 22:05   DG Chest Port 1 View  Result Date: 07/28/2020 CLINICAL DATA:  Vomiting EXAM: PORTABLE CHEST 1 VIEW COMPARISON:  07/05/2020 FINDINGS: Cardiomegaly, vascular congestion. Interstitial prominence may reflect mild interstitial edema. Small bilateral effusions suspected. No acute bony abnormality. IMPRESSION: Cardiomegaly with vascular congestion and probable mild interstitial edema. Electronically Signed   By: Rolm Baptise M.D.   On: 07/28/2020 21:51        Scheduled Meds: . clopidogrel  75 mg Oral Daily  . enoxaparin (LOVENOX) injection  30 mg Subcutaneous Q24H  . furosemide  80 mg Intravenous Q12H  . hydrALAZINE  10 mg Oral Q8H  . isosorbide mononitrate  15 mg Oral Daily  . levothyroxine  25 mcg Oral Daily  . pantoprazole (PROTONIX) IV  40 mg Intravenous Q12H  . rosuvastatin  5 mg Oral QHS  . sodium chloride flush  3 mL Intravenous Q12H   Continuous Infusions: . sodium chloride       LOS: 1 day    Time spent: 39 minutes spent on chart review, discussion with nursing staff, consultants, updating family and interview/physical exam; more than 50% of that time was spent in counseling and/or coordination of care.    Beni Turrell J [place] DO  [facility] Available via Epic secure chat 7am-7pm After these hours, please refer to coverage provider listed on [email or web address] 07/30/2020, 12:34 PM

## 2020-07-30 NOTE — Progress Notes (Signed)
Progress Note  Patient Name: Allison Gardner Date of Encounter: 07/30/2020  [facility] Cardiologist: No primary care provider on file.   Subjective   Denies any chest pain or SOB. She put out 1.15L yesterday and is net neg 1.15L.    Inpatient Medications    Scheduled Meds: . clopidogrel  75 mg Oral Daily  . enoxaparin (LOVENOX) injection  30 mg Subcutaneous Q24H  . furosemide  80 mg Intravenous Q12H  . hydrALAZINE  10 mg Oral Q8H  . isosorbide mononitrate  15 mg Oral Daily  . levothyroxine  25 mcg Oral Daily  . pantoprazole (PROTONIX) IV  40 mg Intravenous Q12H  . rosuvastatin  5 mg Oral QHS  . sodium chloride flush  3 mL Intravenous Q12H   Continuous Infusions: . sodium chloride     PRN Meds: sodium chloride, acetaminophen **OR** acetaminophen, acetaminophen-codeine, magnesium hydroxide, ondansetron **OR** ondansetron (ZOFRAN) IV, sodium chloride flush, traZODone   Vital Signs    Vitals:   07/29/20 2030 07/30/20 0055 07/30/20 0438 07/30/20 0438  BP: 118/84 116/77  106/62  Pulse: 86 98  85  Resp:  18  14  Temp: 99 F (37.2 C) 98 F (36.7 C)  97.9 F (36.6 C)  TempSrc: Oral Oral  Oral  SpO2: 100% 93%  93%  Weight:   48.1 kg   Height:        Intake/Output Summary (Last 24 hours) at 07/30/2020 0843 Last data filed at 07/30/2020 0439 Gross per 24 hour  Intake --  Output 1100 ml  Net -1100 ml   Last 3 Weights 07/30/2020 07/28/2020 07/13/2020  Weight (lbs) 106 lb 0.7 oz 105 lb 12.8 oz 105 lb 12.8 oz  Weight (kg) 48.1 kg 47.991 kg 47.991 kg      Telemetry    NSR - Personally Reviewed  ECG    No new EKG to review - Personally Reviewed  Physical Exam   GEN: No acute distress.   Neck: No JVD Cardiac: RRR, no murmurs, rubs, or gallops.  Respiratory: Clear to auscultation bilaterally. GI: Soft, nontender, non-distended  MS: No edema; No deformity. Neuro:  Nonfocal  Psych: Normal affect   Labs    High Sensitivity Troponin:   Recent Labs  Lab  07/05/20 1256 07/05/20 1456 07/28/20 2125 07/28/20 2325  TROPONINIHS 111* 122* 91* 89*      Chemistry Recent Labs  Lab 07/28/20 2125 07/29/20 0440 07/30/20 0414  NA 147* 146* 147*  K 3.7 5.1 3.9  CL 108 110 108  CO2 '25 25 26  '$ GLUCOSE 117* 86 67*  BUN 51* 50* 49*  CREATININE 2.24* 2.17* 2.24*  CALCIUM 10.0 9.1 9.4  PROT 8.0  --   --   ALBUMIN 4.1  --   --   AST 26  --   --   ALT 12  --   --   ALKPHOS 191*  --   --   BILITOT 1.1  --   --   GFRNONAA 20* 20* 20*  ANIONGAP '14 11 13     '$ Hematology Recent Labs  Lab 07/28/20 2125 07/29/20 0440  WBC 4.8 3.4*  RBC 3.49* 3.05*  HGB 10.5* 9.4*  HCT 32.9* 29.4*  MCV 94.3 96.4  MCH 30.1 30.8  MCHC 31.9 32.0  RDW 18.0* 17.9*  PLT 150 134*    BNPNo results for input(s): BNP, PROBNP in the last 168 hours.   DDimer No results for input(s): DDIMER in the last 168 hours.  Radiology    CT Abdomen Pelvis Wo Contrast  Result Date: 07/28/2020 CLINICAL DATA:  87 year old with abdominal distension and acute abdominal pain. Vomiting for 2 days. EXAM: CT ABDOMEN AND PELVIS WITHOUT CONTRAST TECHNIQUE: Multidetector CT imaging of the abdomen and pelvis was performed following the standard protocol without IV contrast. COMPARISON:  Right upper quadrant ultrasound 07/07/2020 FINDINGS: Lower chest: Moderate right pleural effusion partially included. Cardiomegaly. Decreased blood pool consistent with anemia. No pericardial effusion. Hepatobiliary: Nodular hepatic contours consistent with cirrhosis. No evidence of focal hepatic lesion on noncontrast exam. Unremarkable gallbladder. No calcified gallstone. Proximal common bile duct appears dilated of 15 mm, and there is tapering to the duodenal insertion. Pancreas: Mildly atrophic.  No ductal dilatation or inflammation. Spleen: No splenomegaly.  No focal splenic abnormality. Adrenals/Urinary Tract: No adrenal nodule. No hydronephrosis. Multiple bilateral renal cysts. There are also subcentimeter  hyperdense lesions in the upper left and mid right kidney, likely hyperdense cysts. Possible punctate nonobstructing stone in the upper right kidney. No ureteral stone. Decompressed urinary bladder. Stomach/Bowel: Distended stomach with air-fluid level. There is mild pre pyloric gastric wall thickening. Decompressed small bowel without obstruction or inflammation. Appendix not visualized, prior appendectomy per history diverticulosis throughout the entire colon, most prominent distally. No diverticulitis. No colonic inflammation. Vascular/Lymphatic: Moderately advanced aortic atherosclerosis. No aortic aneurysm. There are few calcified right upper quadrant lymph nodes suggesting prior granulomatous disease. No noncalcified adenopathy. Reproductive: Status post hysterectomy. No adnexal masses. Other: No significant intra-abdominal ascites. No free air or focal fluid collection. There is mild generalized body wall edema. No abdominal wall hernia. Musculoskeletal: Degenerative disc disease and facet hypertrophy in the spine. There are no acute or suspicious osseous abnormalities. IMPRESSION: 1. Mild pre pyloric gastric wall thickening may be related to peptic ulcer disease or gastritis. Mild gastric distension with fluid level. 2. Cirrhosis. Mild common bile duct dilatation which normally tapers at the duodenal insertion. 3. Moderate right pleural effusion partially included. Cardiomegaly. 4. Colonic diverticulosis without diverticulitis. 5. Possible punctate nonobstructing stone in the upper right kidney. 6. Bilateral renal cysts. Additional subcentimeter hyperdense lesions in both kidneys are likely hyperdense cysts, but are not well characterized on noncontrast exam. Aortic Atherosclerosis (ICD10-I70.0). Electronically Signed   By: Keith Rake M.D.   On: 07/28/2020 22:05   DG Chest Port 1 View  Result Date: 07/28/2020 CLINICAL DATA:  Vomiting EXAM: PORTABLE CHEST 1 VIEW COMPARISON:  07/05/2020 FINDINGS:  Cardiomegaly, vascular congestion. Interstitial prominence may reflect mild interstitial edema. Small bilateral effusions suspected. No acute bony abnormality. IMPRESSION: Cardiomegaly with vascular congestion and probable mild interstitial edema. Electronically Signed   By: Rolm Baptise M.D.   On: 07/28/2020 21:51    Cardiac Studies   2D echo 06/2020 IMPRESSIONS   1. Left ventricular ejection fraction, by estimation, is 20 to 25%. The  left ventricle has severely decreased function. The left ventricle  demonstrates global hypokinesis. There is mild left ventricular  hypertrophy. Left ventricular diastolic parameters  are indeterminate.  2. Right ventricular systolic function is normal. The right ventricular  size is normal. There is mildly elevated pulmonary artery systolic  pressure.  3. Left atrial size was moderately dilated.  4. Right atrial size was mildly dilated.  5. The mitral valve is normal in structure. Severe mitral valve  regurgitation. No evidence of mitral stenosis.  6. The tricuspid valve is degenerative. Tricuspid valve regurgitation is  moderate.  7. The aortic valve is tricuspid. Aortic valve regurgitation is mild.  Mild to moderate aortic  valve sclerosis/calcification is present, without  any evidence of aortic stenosis.  8. The inferior vena cava is normal in size with greater than 50%  respiratory variability, suggesting right atrial pressure of 3 mmHg.   Patient Profile     87 y.o. female with a hx of multiple medical problems including peripheral arterial disease, colonic angiodysplasia with history of GI bleeding, CVA, CHF, and dilated cardiomyopathy as well as dyslipidemia in Palliative Care in [place] who is being seen today for the evaluation of CHF and elevated troponin at the request of Eric [place] DO.  Assessment & Plan    Acute on chronic combined systolic/diastolic CHF 2D echo [id number] showed worsening LVF with EF 20-25% (previously 30-35%  in 2021) with severe MR Admitted with N/V/abdominal pain as well as DOE and PND and found to have LE edema and CHF on CXRAY LVF globally down with no focal wall motion abnormalities Started on IV Lasix  SCr elevated at 2.24 (1.56 in early March) and decreased to 2.17 with diuresis but now back up at 2.24 today No significant change in weigh Check BNP since renal function not really improving with diuresis>>Cxray was c/w interstitial edema continue IV Lasix '80mg'$  BID for now and follow strict I&O's, daily weight and renal function closely By review of home meds she had been on carvedilol and Entresto in the past but Entresto once daily now stopped in setting of AKI>>would not restart since she has known baseline CKD stage 3b BB on hold due to acute CHF No ACE/ARB/ARNi due to AKI on CKD Continue Hydralazine '10mg'$  TID and Imdur '15mg'$  daily Given advanced age, not a candidate for advanced therapies or invasive procedures >> she also is in Palliative Care and is DNR  Elevated hs Troponin Likely related to demand ischemia in the setting of acute gastritis, acute CHF and AKI>>no c/w ACS Given advanced age, not a candidate for invasive procedures Continue medical management>>she is already on Plavix and statin for PAD Holding BB in setting of acute CHF  Mitral Regurgitation Severe by recent echo and likely related to LV dysfunction Given advanced age would not pursue invasive procedures such as MitaClip Manage with HF meds and diuretics  PAD Continue Plavix, statin  AKI  Possibly related to passive congestion from acute CHF  Continue to follow with diuresis  Acute gastritis -per TRH    I have spent a total of 30 minutes with patient reviewing 2D echo , telemetry, EKGs, labs and examining patient as well as establishing an assessment and plan that was discussed with the patient.  > 50% of time was spent in direct patient care.       For questions or updates, please contact [place] Please consult [email or web address] for contact info under        Signed, Fransico Him, MD  07/30/2020, 8:43 AM

## 2020-07-31 DIAGNOSIS — N184 Chronic kidney disease, stage 4 (severe): Secondary | ICD-10-CM

## 2020-07-31 LAB — BASIC METABOLIC PANEL
Anion gap: 11 (ref 5–15)
BUN: 48 mg/dL — ABNORMAL HIGH (ref 8–23)
CO2: 28 mmol/L (ref 22–32)
Calcium: 9.2 mg/dL (ref 8.9–10.3)
Chloride: 105 mmol/L (ref 98–111)
Creatinine, Ser: 2.34 mg/dL — ABNORMAL HIGH (ref 0.44–1.00)
GFR, Estimated: 19 mL/min — ABNORMAL LOW (ref >60)
Glucose, Bld: 84 mg/dL (ref 70–99)
Potassium: 4.1 mmol/L (ref 3.5–5.1)
Sodium: 144 mmol/L (ref 135–145)

## 2020-07-31 LAB — URINE CULTURE: Culture: 40000 — AB

## 2020-07-31 MED ORDER — METOPROLOL SUCCINATE ER 25 MG PO TB24
25.0000 mg | ORAL_TABLET | Freq: Every day | ORAL | Status: DC
  Administered 2020-07-31 – 2020-08-01 (×2): 25 mg via ORAL
  Filled 2020-07-31 (×2): qty 1

## 2020-07-31 NOTE — Progress Notes (Addendum)
Progress Note  Patient Name: Allison Gardner Date of Encounter: 07/31/2020  [facility] Cardiologist: Fransico Him, MD   Subjective   Feeling okay this morning. Breathing is much improved since arrival. No complaints of chest pain or palpitations. Asking when she can go home.  Inpatient Medications    Scheduled Meds: . clopidogrel  75 mg Oral Daily  . enoxaparin (LOVENOX) injection  30 mg Subcutaneous Q24H  . furosemide  80 mg Intravenous Q12H  . hydrALAZINE  10 mg Oral Q8H  . isosorbide mononitrate  15 mg Oral Daily  . levothyroxine  25 mcg Oral Daily  . pantoprazole (PROTONIX) IV  40 mg Intravenous Q12H  . rosuvastatin  5 mg Oral QHS  . sodium chloride flush  3 mL Intravenous Q12H   Continuous Infusions: . sodium chloride     PRN Meds: sodium chloride, acetaminophen **OR** acetaminophen, acetaminophen-codeine, magnesium hydroxide, ondansetron **OR** ondansetron (ZOFRAN) IV, sodium chloride flush, traZODone   Vital Signs    Vitals:   07/30/20 2051 07/30/20 2317 07/31/20 0500 07/31/20 0522  BP: 131/75 127/78  111/72  Pulse: (!) 104 96  (!) 104  Resp: '19 20  18  '$ Temp: 98.9 F (37.2 C) 98.9 F (37.2 C)  98.4 F (36.9 C)  TempSrc: Oral Oral  Oral  SpO2: 98% 97%  100%  Weight:   44.2 kg   Height:        Intake/Output Summary (Last 24 hours) at 07/31/2020 1019 Last data filed at 07/31/2020 0900 Gross per 24 hour  Intake 360 ml  Output 2575 ml  Net -2215 ml   Last 3 Weights 07/31/2020 07/30/2020 07/28/2020  Weight (lbs) 97 lb 7.1 oz 106 lb 0.7 oz 105 lb 12.8 oz  Weight (kg) 44.2 kg 48.1 kg 47.991 kg      Telemetry    Sinus rhythm, chronic LBBB; occasional PVCs  - Personally Reviewed  ECG    No new tracings - Personally Reviewed  Physical Exam   GEN: Elderly female sitting in bedside chair in no acute distress.   Neck: No JVD Cardiac: RRR, no murmurs, rubs, or gallops.  Respiratory: rhonchi at lung bases, no wheezing  GI: Soft, nontender,  non-distended  MS: trace LE edema; No deformity. Neuro:  Nonfocal  Psych: Normal affect   Labs    High Sensitivity Troponin:   Recent Labs  Lab 07/05/20 1256 07/05/20 1456 07/28/20 2125 07/28/20 2325  TROPONINIHS 111* 122* 91* 89*      Chemistry Recent Labs  Lab 07/28/20 2125 07/29/20 0440 07/30/20 0414 07/31/20 0432  NA 147* 146* 147* 144  K 3.7 5.1 3.9 4.1  CL 108 110 108 105  CO2 '25 25 26 28  '$ GLUCOSE 117* 86 67* 84  BUN 51* 50* 49* 48*  CREATININE 2.24* 2.17* 2.24* 2.34*  CALCIUM 10.0 9.1 9.4 9.2  PROT 8.0  --   --   --   ALBUMIN 4.1  --   --   --   AST 26  --   --   --   ALT 12  --   --   --   ALKPHOS 191*  --   --   --   BILITOT 1.1  --   --   --   GFRNONAA 20* 20* 20* 19*  ANIONGAP '14 11 13 11     '$ Hematology Recent Labs  Lab 07/28/20 2125 07/29/20 0440  WBC 4.8 3.4*  RBC 3.49* 3.05*  HGB 10.5* 9.4*  HCT 32.9*  29.4*  MCV 94.3 96.4  MCH 30.1 30.8  MCHC 31.9 32.0  RDW 18.0* 17.9*  PLT 150 134*    BNP Recent Labs  Lab 07/30/20 0414  BNP 4,457.9*     DDimer No results for input(s): DDIMER in the last 168 hours.   Radiology    No results found.  Cardiac Studies   2D echo 06/2020 IMPRESSIONS   1. Left ventricular ejection fraction, by estimation, is 20 to 25%. The  left ventricle has severely decreased function. The left ventricle  demonstrates global hypokinesis. There is mild left ventricular  hypertrophy. Left ventricular diastolic parameters  are indeterminate.  2. Right ventricular systolic function is normal. The right ventricular  size is normal. There is mildly elevated pulmonary artery systolic  pressure.  3. Left atrial size was moderately dilated.  4. Right atrial size was mildly dilated.  5. The mitral valve is normal in structure. Severe mitral valve  regurgitation. No evidence of mitral stenosis.  6. The tricuspid valve is degenerative. Tricuspid valve regurgitation is  moderate.  7. The aortic valve is  tricuspid. Aortic valve regurgitation is mild.  Mild to moderate aortic valve sclerosis/calcification is present, without  any evidence of aortic stenosis.  8. The inferior vena cava is normal in size with greater than 50%  respiratory variability, suggesting right atrial pressure of 3 mmHg.   Patient Profile     87 y.o. female with a hx of multiple medical problems including peripheral arterial disease, colonic angiodysplasia with history of GI bleeding, CVA, CHF, and dilated cardiomyopathy as well as dyslipidemiain Palliative Care in [place] is being seen today for the evaluation ofCHF and elevated troponinat the request of Eric [place] DO.  Assessment & Plan    1. Acute on chronic combined CHF: patient presented with abdominal pain and N/V, as well as DOE and PND. BNP elevated to >4500. Found to have LE edema and evidence of CHF on CXR. Echo showed EF 20-25% (down from 30-35% in 2021, with EF >55% with no evidence of MR on echo in 2000) and severe MR. Felt to be a poor candidate for advanced therapies or invasive procedures given advance age and comorbidities. She was started on IV lasix '80mg'$  BID with UOP net -2.0L in the past 24 hours (with 1 unmeasured UOP occurrence) and net -3.2L this admission. Weight is down to 97lbs today from 105lbs on admission. Cr unfortunately has been elevated since admission: 2.24>2.17>2.24>2.34 today; baseline 1.56 07/12/20. She was previously on entresto once daily, though this has been stopped in the setting of AKI. Home BBlocker on hold due to acute CHF.  - Consider transition to po lasix today or tomorrow.  - Continue hydralazine '10mg'$  TID and imdur '15mg'$  daily - Will restart BBlocker today - will transition from carvedilol 3.125 mg BID to metoprolol succinate '25mg'$  daily given soft blood pressures and mild tachycardia   2. Elevated HsTrop in patient without known CAD: HsTrop elevated to 91>89 this admission; trend not c/w ACS. Suspect demand ischemia in  the setting of acute gastritis and acute on chronic CHF.  Not a candidate for invasive procedures due to advanced age and comorbidities. - Continue plavix and statin - Will restart BBlocker as above  3. Mitral regurgitation: severe on echo this admission. Likely contributing to #1. Felt to be a poor candidate for invasive procedures such as a MitraClip due to advanced age and comorbidities.  - Continue diuresis as above  4. PAD: no claudication complaints - Continue plavix  and statin  5. AoCKD stage 3: Cr up to 2.34 today from 2.24 on admission; up from 1.56 07/12/20. Felt to possibly be related to passive congestion from acute CHF, though Cr has uptrended slightly with diuresis. Entresto discontinued on admission.  - Continue to monitor closely with diuresis.  6. Gastritis: patient presented with intractable N/V with evidence of gastritis on CT A/P. On IV PPI BID - Continue management pr primary team      For questions or updates, please contact [place] Please consult [email or web address] for contact info under        Signed, Abigail Butts, PA-C  07/31/2020, 10:19 AM    Personally seen and examined. Agree with above.   Feeling better.  Still some rhonchi at bases on exam.  Creatinine slightly increased today, 2.3.  -Agree with continued IV diuresis today with hopeful transition to p.o. tomorrow. -Agree with not utilizing Entresto given her advanced age as well as chronic kidney disease and soft blood pressure. -Agree with hydralazine and isosorbide additions. -Low-dose Crestor.  Continuing with clopidogrel 75.  Candee Furbish, MD

## 2020-07-31 NOTE — Plan of Care (Signed)

## 2020-07-31 NOTE — Evaluation (Signed)
Occupational Therapy Evaluation Patient Details Name: Allison Gardner MRN: [id number] DOB: Jun 24, 1922 Today's Date: 07/31/2020    History of Present Illness 87 y.o. female presenting with progressive shortness of breath, orthopnea and increased lower extremity edema. Patient admitted with acute on chronic systolic CHF. Patient recently discharged from [facility] on 07/05/2020 with similar diagnosis. PMHx significant for HTN, thyroid disease, CKD IV, malnutrition and frailty.   Clinical Impression   PTA patient was living with family and was requiring some assist with ADLs/IADLs. Patient reports use of RW vs. SPC for mobility in home. Patient currently presents near baseline level of function requiring Min guard to Min A overall with observed ADLs. Patient also c/o pain in L cervical neck radiating down into LUE. Patient would benefit from continued acute OT services in prep for safe d/c home with recommended HHOT and 24hr supervision/assist.     Follow Up Recommendations  Home health OT;Supervision/Assistance - 24 hour    Equipment Recommendations  None recommended by OT    Recommendations for Other Services       Precautions / Restrictions Precautions Precautions: Fall Restrictions Weight Bearing Restrictions: No      Mobility Bed Mobility Overal bed mobility: Needs Assistance Bed Mobility: Supine to Sit     Supine to sit: Supervision;HOB elevated     General bed mobility comments: Able to advance BLE toward EOB and elevate trunk with HOB elevated and increased time.    Transfers Overall transfer level: Needs assistance Equipment used: Rolling walker (2 wheeled) Transfers: Sit to/from Stand Sit to Stand: Min guard         General transfer comment: From EOB positioned in lowest setting with Min guard.    Balance Overall balance assessment: Needs assistance Sitting-balance support: Feet supported;No upper extremity supported Sitting balance-Leahy Scale:  Fair Sitting balance - Comments: Able to maintain static sitting balance at EOB without external assist.   Standing balance support: Bilateral upper extremity supported;No upper extremity supported Standing balance-Leahy Scale: Fair Standing balance comment: reliant on UEs for dynamic tasks                           ADL either performed or assessed with clinical judgement   ADL Overall ADL's : Needs assistance/impaired Eating/Feeding: Modified independent               Upper Body Dressing : Set up;Sitting Upper Body Dressing Details (indicate cue type and reason): Donned posterior gown seated EOB. Lower Body Dressing: Minimal assistance;Sit to/from stand Lower Body Dressing Details (indicate cue type and reason): Able to don slip-on shoes without external assist. Toilet Transfer: Minimal assistance;Ambulation Toilet Transfer Details (indicate cue type and reason): Simluated with transfer to recliner.                 Vision Baseline Vision/History: No visual deficits Patient Visual Report: No change from baseline       Perception     Praxis      Pertinent Vitals/Pain Pain Assessment: Faces Faces Pain Scale: Hurts even more Pain Location: L lateral cervical neck at rest. Pain Descriptors / Indicators: Grimacing;Sore Pain Intervention(s): Limited activity within patient's tolerance;Monitored during session;Repositioned     Hand Dominance Right   Extremity/Trunk Assessment Upper Extremity Assessment Upper Extremity Assessment: Generalized weakness   Lower Extremity Assessment Lower Extremity Assessment: Generalized weakness   Cervical / Trunk Assessment Cervical / Trunk Assessment: Kyphotic;Other exceptions Cervical / Trunk Exceptions: c/o pain in cervical neck  radiating into LUE.   Communication Communication Communication: No difficulties   Cognition Arousal/Alertness: Awake/alert Behavior During Therapy: WFL for tasks  assessed/performed Overall Cognitive Status: History of cognitive impairments - at baseline                                 General Comments: Patient not oriented to situation or time. No family present at bedside to confirm baseline cognition.   General Comments       Exercises     Shoulder Instructions      Home Living Family/patient expects to be discharged to:: Private residence Living Arrangements: Children;Other relatives Available Help at Discharge: Family;Available 24 hours/day;Other (Comment) Type of Home: House Home Access: Stairs to enter [employer] of Steps: 2 Entrance Stairs-Rails: None Home Layout: Laundry or work area in basement;Multi-level     Bathroom Shower/Tub: Teacher, early years/pre: Standard Bathroom Accessibility: Yes How Accessible: Accessible via walker Home Equipment: Clinical cytogeneticist - 2 wheels;Cane - single point   Additional Comments: When OT inquired about DME patient states "I have all of that". Does not specify which DME she has.      Prior Functioning/Environment Level of Independence: Needs assistance  Gait / Transfers Assistance Needed: ambulates with cane/RW for household distances. Patient states "I have them all around the house. If I can't get to one, I use the other". ADL's / Homemaking Assistance Needed: Daughter helps with tub transfers. bathes and dresses herself, fixes coffee family provides for the rest of her IADLs   Comments: enjoys sewing and cooking        OT Problem List: Decreased strength;Decreased activity tolerance;Impaired balance (sitting and/or standing);Decreased cognition;Decreased safety awareness;Decreased knowledge of use of DME or AE;Cardiopulmonary status limiting activity      OT Treatment/Interventions: Self-care/ADL training;Therapeutic exercise;Neuromuscular education;Energy conservation;DME and/or AE instruction;Therapeutic activities;Cognitive  remediation/compensation;Patient/family education;Balance training    OT Goals(Current goals can be found in the care plan section) Acute Rehab OT Goals Patient Stated Goal: To return home. OT Goal Formulation: With patient Time For Goal Achievement: 08/14/20 Potential to Achieve Goals: Good ADL Goals Additional ADL Goal #1: Patient will complete a.m. ADL with supervision A and LRAD. Additional ADL Goal #2: Patient/family will recall 3 fall reducing strategies in prep for safe d/c home.  OT Frequency: Min 2X/week   Barriers to D/C:            Co-evaluation              AM-PAC OT "6 Clicks" Daily Activity     Outcome Measure Help from another person eating meals?: None Help from another person taking care of personal grooming?: None Help from another person toileting, which includes using toliet, bedpan, or urinal?: A Little Help from another person bathing (including washing, rinsing, drying)?: A Little Help from another person to put on and taking off regular upper body clothing?: A Little Help from another person to put on and taking off regular lower body clothing?: A Little 6 Click Score: 20   End of Session Equipment Utilized During Treatment: Rolling walker Nurse Communication: Mobility status  Activity Tolerance: Patient tolerated treatment well Patient left: in chair;with call bell/phone within reach;with chair alarm set  OT Visit Diagnosis: Unsteadiness on feet (R26.81);Other abnormalities of gait and mobility (R26.89);Muscle weakness (generalized) (M62.81);Other symptoms and signs involving cognitive function                Time:  [id number] OT Time Calculation (min): 15 min Charges:  OT General Charges $OT Visit: 1 Visit OT Evaluation $OT Eval Low Complexity: 1 Low  Genessa Beman H. OTR/L Supplemental OT, Department of rehab services [phone]  Hunter Pinkard R H. 07/31/2020, 9:01 AM

## 2020-07-31 NOTE — Progress Notes (Signed)
°  Speech Language Pathology Treatment: Dysphagia  Patient Details Name: Allison Gardner MRN: [id number] DOB: 03/03/1923 Today's Date: 07/31/2020 Time: [id number] SLP Time Calculation (min) (ACUTE ONLY): 15 min  Assessment / Plan / Recommendation Clinical Impression  Patient seen to address dysphagia goals with SLP observing her with Dys 3 solids. Patient reported that she has not eaten much yesterday and today. She was agreeable to PO's and continues to be able to self feed and does so at a slow rate. Mastication mildly delayed but improved as compared to yesterday and patient with only very trace oral residuals post initial swallow. SLP recommending to upgrade patient's solid consistencies from dysphagia 2 to dysphagia 3 at this time.   HPI HPI: Patient is a 87 y.o. female with PMH: PAD, colonic angiodysplasia with h/o GI bleeding, CVA, CHF, dilated cardiomyopathy, dyslipidemia, cognitive impairment, who presented to ER with acute onset intractiable nausea and vomiting for a couple of days prior with mild generalized abdominal pain and worsening LE edema. Patient admitted 7 vomiting episdoes per day with occasional dark green emesis. CXR revealed cardiomegaly with vascular congestion and probable mid interstitial edema. CT abdomen/pelvis showed prepyloric gastric wall thickening consistent with GERD/gastritis with mild gastric distention, cirrhosis with mild common bile duct dilation, moderate right pleural effusion, cardiomegaly diverticulosis without diverticulitis, nonobstructing stone upper right kidney and bilateral renal cysts.      SLP Plan  Continue with current plan of care       Recommendations  Diet recommendations: Dysphagia 3 (mechanical soft);Thin liquid Liquids provided via: Cup;Straw Medication Administration: Whole meds with liquid Compensations: Slow rate;Small sips/bites Postural Changes and/or Swallow Maneuvers: Seated upright 90 degrees                Oral Care  Recommendations: Oral care BID;Staff/trained caregiver to provide oral care Follow up Recommendations: None SLP Visit Diagnosis: Dysphagia, unspecified (R13.10) Plan: Continue with current plan of care       GO               Sonia Baller, MA, CCC-SLP Speech Therapy
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
## 2020-07-31 NOTE — TOC Progression Note (Signed)
Transition of Care [facility]) - Progression Note    Patient Details  Name: Allison Gardner MRN: [id number] Date of Birth: [birth year]  [facility] CM/SW Contact  Purcell Mouton, RN Phone Number: 07/31/2020, 2:20 PM  Clinical Narrative:     Spoke with pt's daughter [name] concerning [place] needs. Daughter states that Pt is active with [facility] in [place], [place] and will continue at discharge with Agency.        Expected Discharge Plan and Services                                                 Social Determinants of Health (SDOH) Interventions    Readmission Risk Interventions No flowsheet data found.

## 2020-08-01 ENCOUNTER — Telehealth: Payer: Self-pay | Admitting: Cardiology

## 2020-08-01 DIAGNOSIS — B952 Enterococcus as the cause of diseases classified elsewhere: Secondary | ICD-10-CM

## 2020-08-01 LAB — BASIC METABOLIC PANEL
Anion gap: 16 — ABNORMAL HIGH (ref 5–15)
BUN: 49 mg/dL — ABNORMAL HIGH (ref 8–23)
CO2: 25 mmol/L (ref 22–32)
Calcium: 10 mg/dL (ref 8.9–10.3)
Chloride: 103 mmol/L (ref 98–111)
Creatinine, Ser: 2.56 mg/dL — ABNORMAL HIGH (ref 0.44–1.00)
GFR, Estimated: 17 mL/min — ABNORMAL LOW (ref >60)
Glucose, Bld: 124 mg/dL — ABNORMAL HIGH (ref 70–99)
Potassium: 3.8 mmol/L (ref 3.5–5.1)
Sodium: 144 mmol/L (ref 135–145)

## 2020-08-01 MED ORDER — OXYCODONE HCL 5 MG PO TABS: 2.5000 mg | ORAL_TABLET | Freq: Four times a day (QID) | ORAL | 0 refills | Status: DC | PRN

## 2020-08-01 MED ORDER — FUROSEMIDE 80 MG PO TABS: 80.0000 mg | ORAL_TABLET | Freq: Two times a day (BID) | ORAL | 2 refills | Status: DC

## 2020-08-01 MED ORDER — HYDRALAZINE HCL 10 MG PO TABS: 10.0000 mg | ORAL_TABLET | Freq: Three times a day (TID) | ORAL | 0 refills | Status: DC

## 2020-08-01 MED ORDER — FUROSEMIDE 40 MG PO TABS: 80.0000 mg | ORAL_TABLET | Freq: Two times a day (BID) | ORAL | Status: DC

## 2020-08-01 MED ORDER — DIPHENHYDRAMINE HCL 50 MG/ML IJ SOLN
25.0000 mg | Freq: Once | INTRAMUSCULAR | Status: AC
  Administered 2020-08-01: 25 mg via INTRAVENOUS
  Filled 2020-08-01: qty 1

## 2020-08-01 MED ORDER — NITROFURANTOIN MONOHYD MACRO 100 MG PO CAPS: 100.0000 mg | ORAL_CAPSULE | Freq: Two times a day (BID) | ORAL | 0 refills | Status: AC

## 2020-08-01 MED ORDER — ISOSORBIDE MONONITRATE ER 30 MG PO TB24: 15.0000 mg | ORAL_TABLET | Freq: Every day | ORAL | 0 refills | Status: DC

## 2020-08-01 MED ORDER — METOPROLOL SUCCINATE ER 25 MG PO TB24: 25.0000 mg | ORAL_TABLET | Freq: Every day | ORAL | 0 refills | Status: DC

## 2020-08-01 NOTE — Discharge Summary (Signed)
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 COLONIES/mL ENTEROCOCCUS FAECALIS  SARS CORONAVIRUS 2 (TAT 6-24 HRS) Nasopharyngeal Nasopharyngeal Swab     Status: None   Collection Time: 07/29/20  2:10 AM   Specimen: Nasopharyngeal Swab  Result Value Ref Range Status   SARS Coronavirus 2 NEGATIVE NEGATIVE Final    Comment: (NOTE) SARS-CoV-2 target nucleic acids are NOT DETECTED.  The SARS-CoV-2 RNA is generally detectable in upper and lower respiratory specimens during the acute phase of infection. Negative results do not preclude SARS-CoV-2 infection, do not rule out co-infections with other pathogens, and should not be used as the sole basis for treatment or other patient management decisions. Negative results must be combined with clinical observations, patient history, and epidemiological information. The expected result is Negative.  Fact Sheet for Patients: SugarRoll.be  Fact Sheet for  Healthcare Providers: https://www.woods-mathews.com/  This test is not yet approved or cleared by the [place] FDA and  has been authorized for detection and/or diagnosis of SARS-CoV-2 by FDA under an Emergency Use Authorization (EUA). This EUA will remain  in effect (meaning this test can be used) for the duration of the COVID-19 declaration under Se ction 564(b)(1) of the Act, 21 U.S.C. section 360bbb-3(b)(1), unless the authorization is terminated or revoked sooner.  Performed at [facility], [place] [address], [place], [place] [address]      Labs: BNP (last 3 results) Recent Labs    07/05/20 1600 07/30/20 0414  BNP >4,500.0* 123456*   Basic Metabolic Panel: Recent Labs  Lab 07/28/20 2125 07/29/20 0440 07/30/20 0414 07/31/20 0432 08/01/20 0434  NA 147* 146* 147* 144 144  K 3.7 5.1 3.9 4.1 3.8  CL 108 110 108 105 103  CO2 '25 25 26 28 25  '$ GLUCOSE 117* 86 67* 84 124*  BUN 51* 50* 49* 48* 49*  CREATININE 2.24* 2.17* 2.24* 2.34* 2.56*  CALCIUM 10.0 9.1 9.4 9.2 10.0  MG  --   --  2.2  --   --    Liver Function Tests: Recent Labs  Lab 07/28/20 2125  AST 26  ALT 12  ALKPHOS 191*  BILITOT 1.1  PROT 8.0  ALBUMIN 4.1   No results for input(s): LIPASE, AMYLASE in the last 168 hours. No results for input(s): AMMONIA in the last 168 hours. CBC: Recent Labs  Lab 07/28/20 2125 07/29/20 0440  WBC 4.8 3.4*  NEUTROABS 3.8  --   HGB 10.5* 9.4*  HCT 32.9* 29.4*  MCV 94.3 96.4  PLT 150 134*   Cardiac Enzymes: No results for input(s): CKTOTAL, CKMB, CKMBINDEX, TROPONINI in the last 168 hours. BNP: Invalid input(s): POCBNP CBG: No results for input(s): GLUCAP in the last 168 hours. D-Dimer No results for input(s): DDIMER in the last 72 hours. Hgb A1c No results for input(s): HGBA1C in the last 72 hours. Lipid Profile No results for input(s): CHOL, HDL, LDLCALC, TRIG, CHOLHDL, LDLDIRECT in the last 72 hours. Thyroid function studies No  results for input(s): TSH, T4TOTAL, T3FREE, THYROIDAB in the last 72 hours.  Invalid input(s): FREET3 Anemia work up No results for input(s): VITAMINB12, FOLATE, FERRITIN, TIBC, IRON, RETICCTPCT in the last 72 hours. Urinalysis    Component Value Date/Time   COLORURINE YELLOW 07/29/2020 0114   APPEARANCEUR HAZY (A) 07/29/2020 0114   LABSPEC 1.023 07/29/2020 0114   PHURINE 5.0 07/29/2020 0114   GLUCOSEU NEGATIVE 07/29/2020 0114   HGBUR SMALL (A) 07/29/2020 0114   BILIRUBINUR NEGATIVE 07/29/2020 0114   KETONESUR 5 (A) 07/29/2020 0114   PROTEINUR 100 (A) 07/29/2020 0114   UROBILINOGEN 0.2 12/22/2008 0404   NITRITE NEGATIVE 07/29/2020 0114   LEUKOCYTESUR NEGATIVE 07/29/2020 0114   Sepsis Labs Invalid input(s): PROCALCITONIN,  WBC,  LACTICIDVEN Microbiology Recent  Results (from the past 240 hour(s))  Urine culture     Status: Abnormal   Collection Time: 07/29/20  1:14 AM   Specimen: Urine, Random  Result Value Ref Range Status   Specimen Description   Final    URINE, RANDOM Performed at [facility] [address], [place], [place] [address]    Special Requests   Final    NONE Performed at [facility] [place] [address], [place], [place] [address]    Culture 40,000 COLONIES/mL ENTEROCOCCUS FAECALIS (A)  Final   Report Status 07/31/2020 FINAL  Final   Organism ID, Bacteria ENTEROCOCCUS FAECALIS (A)  Final      Susceptibility   Enterococcus faecalis - MIC*    AMPICILLIN <=2 SENSITIVE Sensitive     NITROFURANTOIN <=16 SENSITIVE Sensitive     VANCOMYCIN 1 SENSITIVE Sensitive     * 40,000 COLONIES/mL ENTEROCOCCUS FAECALIS  SARS CORONAVIRUS 2 (TAT 6-24 HRS) Nasopharyngeal Nasopharyngeal Swab     Status: None   Collection Time: 07/29/20  2:10 AM   Specimen: Nasopharyngeal Swab  Result Value Ref Range Status   SARS Coronavirus 2 NEGATIVE NEGATIVE Final    Comment: (NOTE) SARS-CoV-2 target nucleic acids are NOT DETECTED.  The SARS-CoV-2 RNA  is generally detectable in upper and lower respiratory specimens during the acute phase of infection. Negative results do not preclude SARS-CoV-2 infection, do not rule out co-infections with other pathogens, and should not be used as the sole basis for treatment or other patient management decisions. Negative results must be combined with clinical observations, patient history, and epidemiological information. The expected result is Negative.  Fact Sheet for Patients: SugarRoll.be  Fact Sheet for Healthcare Providers: https://www.woods-mathews.com/  This test is not yet approved or cleared by the [place] FDA and  has been authorized for detection and/or diagnosis of SARS-CoV-2 by FDA under an Emergency Use Authorization (EUA). This EUA will remain  in effect (meaning this test can be used) for the duration of the COVID-19 declaration under Se ction 564(b)(1) of the Act, 21 U.S.C. section 360bbb-3(b)(1), unless the authorization is terminated or revoked sooner.  Performed at [facility], [place] [address], [place], [place] [address]      Time coordinating discharge: Over 30 minutes  SIGNED:   Donnamarie Poag [place] DO  [facility] 08/01/2020, 12:58 PM

## 2020-08-01 NOTE — Discharge Instructions (Signed)

## 2020-08-01 NOTE — Progress Notes (Addendum)
Progress Note  Patient Name: Allison Gardner Date of Encounter: 08/01/2020  [facility] Cardiologist: Fransico Him, MD   Subjective   Laying in bed comfortably. No complaints of chest pain or SOB. Wants to go home  Inpatient Medications    Scheduled Meds: . clopidogrel  75 mg Oral Daily  . enoxaparin (LOVENOX) injection  30 mg Subcutaneous Q24H  . furosemide  80 mg Intravenous Q12H  . hydrALAZINE  10 mg Oral Q8H  . isosorbide mononitrate  15 mg Oral Daily  . levothyroxine  25 mcg Oral Daily  . metoprolol succinate  25 mg Oral Daily  . pantoprazole (PROTONIX) IV  40 mg Intravenous Q12H  . rosuvastatin  5 mg Oral QHS  . sodium chloride flush  3 mL Intravenous Q12H   Continuous Infusions: . sodium chloride     PRN Meds: sodium chloride, acetaminophen **OR** acetaminophen, acetaminophen-codeine, magnesium hydroxide, ondansetron **OR** ondansetron (ZOFRAN) IV, sodium chloride flush, traZODone   Vital Signs    Vitals:   07/31/20 0522 07/31/20 1304 07/31/20 2117 08/01/20 0548  BP: 111/72 114/67 122/71 103/61  Pulse: (!) 104 94 100 (!) 104  Resp: '18 18 18 20  '$ Temp: 98.4 F (36.9 C) (!) 97.5 F (36.4 C) (!) 97.5 F (36.4 C) 98.5 F (36.9 C)  TempSrc: Oral Oral    SpO2: 100% 99% 100%   Weight:      Height:        Intake/Output Summary (Last 24 hours) at 08/01/2020 0918 Last data filed at 08/01/2020 0548 Gross per 24 hour  Intake 0 ml  Output 650 ml  Net -650 ml   Last 3 Weights 07/31/2020 07/30/2020 07/28/2020  Weight (lbs) 97 lb 7.1 oz 106 lb 0.7 oz 105 lb 12.8 oz  Weight (kg) 44.2 kg 48.1 kg 47.991 kg      Telemetry    Sinus rhythm/sinus tachycardia with rates to 130s briefly at times. - Personally Reviewed  ECG    No new tracings - Personally Reviewed  Physical Exam   GEN: Frail elderly female laying in bed in no acute distress.   Neck: No JVD Cardiac: RRR, +murmur, no rubs or gallops.  Respiratory: Crackles at lung bases, no  wheezing/rhonchi GI: Soft, nontender, non-distended  MS: No edema; No deformity. Neuro:  Nonfocal  Psych: Normal affect   Labs    High Sensitivity Troponin:   Recent Labs  Lab 07/05/20 1256 07/05/20 1456 07/28/20 2125 07/28/20 2325  TROPONINIHS 111* 122* 91* 89*      Chemistry Recent Labs  Lab 07/28/20 2125 07/29/20 0440 07/30/20 0414 07/31/20 0432 08/01/20 0434  NA 147*   < > 147* 144 144  K 3.7   < > 3.9 4.1 3.8  CL 108   < > 108 105 103  CO2 25   < > '26 28 25  '$ GLUCOSE 117*   < > 67* 84 124*  BUN 51*   < > 49* 48* 49*  CREATININE 2.24*   < > 2.24* 2.34* 2.56*  CALCIUM 10.0   < > 9.4 9.2 10.0  PROT 8.0  --   --   --   --   ALBUMIN 4.1  --   --   --   --   AST 26  --   --   --   --   ALT 12  --   --   --   --   ALKPHOS 191*  --   --   --   --  BILITOT 1.1  --   --   --   --   GFRNONAA 20*   < > 20* 19* 17*  ANIONGAP 14   < > 13 11 16*   < > = values in this interval not displayed.     Hematology Recent Labs  Lab 07/28/20 2125 07/29/20 0440  WBC 4.8 3.4*  RBC 3.49* 3.05*  HGB 10.5* 9.4*  HCT 32.9* 29.4*  MCV 94.3 96.4  MCH 30.1 30.8  MCHC 31.9 32.0  RDW 18.0* 17.9*  PLT 150 134*    BNP Recent Labs  Lab 07/30/20 0414  BNP 4,457.9*     DDimer No results for input(s): DDIMER in the last 168 hours.   Radiology    No results found.  Cardiac Studies   2D echo 06/2020 IMPRESSIONS   1. Left ventricular ejection fraction, by estimation, is 20 to 25%. The  left ventricle has severely decreased function. The left ventricle  demonstrates global hypokinesis. There is mild left ventricular  hypertrophy. Left ventricular diastolic parameters  are indeterminate.  2. Right ventricular systolic function is normal. The right ventricular  size is normal. There is mildly elevated pulmonary artery systolic  pressure.  3. Left atrial size was moderately dilated.  4. Right atrial size was mildly dilated.  5. The mitral valve is normal in  structure. Severe mitral valve  regurgitation. No evidence of mitral stenosis.  6. The tricuspid valve is degenerative. Tricuspid valve regurgitation is  moderate.  7. The aortic valve is tricuspid. Aortic valve regurgitation is mild.  Mild to moderate aortic valve sclerosis/calcification is present, without  any evidence of aortic stenosis.  8. The inferior vena cava is normal in size with greater than 50%  respiratory variability, suggesting right atrial pressure of 3 mmHg.    Patient Profile     87 y.o.femalewith a hx of multiple medical problems including peripheral arterial disease, colonic angiodysplasia with history of GI bleeding, CVA, CHF, and dilated cardiomyopathy as well as dyslipidemiain Palliative Care in [place] is being followed by cardiology for the evaluation ofCHF and elevated troponin.  Assessment & Plan    1. Acute on chronic combined CHF: patient presented with abdominal pain and N/V, as well as DOE and PND. BNP elevated to >4500. Found to have LE edema and evidence of CHF on CXR. Echo showed EF 20-25% (down from 30-35% in 2021, with EF >55% with no evidence of MR on echo in 2000) and severe MR. Felt to be a poor candidate for advanced therapies or invasive procedures given advance age and comorbidities. She was started on IV lasix '80mg'$  BID with UOP net -535m in the past 24 hours (with 2 unmeasured UOP occurrence) and net -3.7L this admission. Weight is down to 97lbs yesterday from 105lbs on admission. Cr unfortunately has been elevated since admission: 2.24>2.17>2.24>2.34>2.56 today; baseline 1.56 07/12/20. She was previously on entresto once daily, though this has been stopped in the setting of AKI. Home BBlocker on hold due to acute CHF.  - Will stop IV lasix today given uptrending Cr with plans to restart home po lasix '80mg'$  BID tomorrow.  - Continue metoprolol succinate, hydralazine '10mg'$  TID, and imdur '15mg'$  daily   2. Elevated HsTrop in patient without  known CAD: HsTrop elevated to 91>89 this admission; trend not c/w ACS. Suspect demand ischemia in the setting of acute gastritis and acute on chronic CHF.  Not a candidate for invasive procedures due to advanced age and comorbidities. - Continue plavix and  statin - Continue metoprolol succinate  3. Mitral regurgitation: severe on echo this admission. Likely contributing to #1. Felt to be a poor candidate for invasive procedures such as a MitraClip due to advanced age and comorbidities.  - Continue diuresis as above  4. PAD: no claudication complaints - Continue plavix and statin  5. AoCKD stage 3: Cr up to 2.56 today from 2.24 on admission; up from 1.56 07/12/20. Felt to possibly be related to passive congestion from acute CHF, though Cr has uptrended slightly with diuresis. Entresto discontinued on admission.  - Continue to monitor closely with diuresis.  6. Gastritis: patient presented with intractable N/V with evidence of gastritis on CT A/P. On IV PPI BID - Continue management pr primary team     Follow-up has been arranged and updated in AVS.   For questions or updates, please contact [place] Please consult [email or web address] for contact info under        Signed, Abigail Butts, PA-C  08/01/2020, 9:18 AM    Personally seen and examined. Agree with above.   Okay for discharge.  EF 20 to 25% given IV Lasix.  Creatinine 2.5.  Stopped Entresto.  Stopped IV Lasix.  Tomorrow restart p.o. Lasix 80 mg twice daily.  Continue to monitor closely as outpatient.  Okay to continue with metoprolol hydralazine and isosorbide.  Avoid nephrotoxic agents.  Pleasant, breathing comfortably.  Follow-up maintained.  Candee Furbish, MD

## 2020-08-01 NOTE — Progress Notes (Signed)
Trying to climb out of bed multi times and pulling tele off. Confused and disoriented.Marland Kitchen

## 2020-08-01 NOTE — Progress Notes (Signed)
On call notified of situation. Order noted. Will cont to assess.

## 2020-08-01 NOTE — Progress Notes (Signed)
Confused and disoriented. Keep trying to climb OOB and taking tele off. Reoriented.

## 2020-08-01 NOTE — TOC Progression Note (Signed)
Transition of Care [facility]) - Progression Note    Patient Details  Name: Allison Gardner MRN: [id number] Date of Birth: 1922/06/22  Transition of Care [facility]) CM/SW Contact  Purcell Mouton, RN Phone Number: 08/01/2020, 12:48 PM  Clinical Narrative:    Safe ride was called for transportation. Pt's son [name] and RN is aware.         Expected Discharge Plan and Services           Expected Discharge Date: 08/01/20                                     Social Determinants of Health (SDOH) Interventions    Readmission Risk Interventions No flowsheet data found.

## 2020-08-01 NOTE — Telephone Encounter (Signed)
Patient has a TOC follow-up appointment scheduled for 08/15/20 at 11:15 AM with Ermalinda Barrios, PA, per Roby Lofts, PA.

## 2020-08-02 NOTE — Telephone Encounter (Signed)
Patient's daughter [name] [facility]) contacted regarding the pts discharge from [facility] on 08/01/2020.  [name] understands that the pt is to follow up with provider Ermalinda Barrios, PA-c on 08/15/2020 at 11:15 at [address] [place], [place] [address]. [name] understands the pts discharge instructions?  Yes [name] understands the pts medications and regiment? Yes [place] understands to bring all of the pts medications to this visit? Yes  Ask patient:  Are you enrolled in My Chart: No and not interested   [name] states that the pt is doing ok and is sleeping comfortably. She reports that the pt has had no c/o CP/discomfort, SOB, weight gain, swelling, nausea, diaphoresis, or dizziness/lightheadedness but does states that the pt has been sleeping a lot.  [name] states that she was advised to schedule the pt a f/u with her PCP at her hospital discharge and that she plans to call that office today  [name] has [facility]'s phone number and is aware to call us if she has any questions or concerns. She thanked me for my call.

## 2020-08-15 ENCOUNTER — Ambulatory Visit: Payer: Medicare HMO | Admitting: Physician Assistant

## 2020-08-24 ENCOUNTER — Ambulatory Visit: Payer: Medicare HMO | Admitting: Cardiology

## 2020-09-22 ENCOUNTER — Encounter: Payer: Self-pay | Admitting: Cardiology

## 2020-09-22 ENCOUNTER — Ambulatory Visit: Payer: Medicare HMO | Admitting: Cardiology

## 2020-09-22 ENCOUNTER — Other Ambulatory Visit: Payer: Self-pay

## 2020-09-22 ENCOUNTER — Encounter (INDEPENDENT_AMBULATORY_CARE_PROVIDER_SITE_OTHER): Payer: Self-pay

## 2020-09-22 VITALS — BP 102/50 | HR 92 | Ht 60.0 in | Wt 94.4 lb

## 2020-09-22 DIAGNOSIS — I340 Nonrheumatic mitral (valve) insufficiency: Secondary | ICD-10-CM

## 2020-09-22 DIAGNOSIS — N1832 Chronic kidney disease, stage 3b: Secondary | ICD-10-CM | POA: Diagnosis not present

## 2020-09-22 DIAGNOSIS — I5042 Chronic combined systolic (congestive) and diastolic (congestive) heart failure: Secondary | ICD-10-CM | POA: Diagnosis not present

## 2020-09-22 DIAGNOSIS — I10 Essential (primary) hypertension: Secondary | ICD-10-CM | POA: Diagnosis not present

## 2020-09-22 MED ORDER — FUROSEMIDE 40 MG PO TABS: 40.0000 mg | ORAL_TABLET | Freq: Every day | ORAL | 3 refills | Status: DC

## 2020-09-22 NOTE — Addendum Note (Signed)
Addended by: Antonieta Iba on: 09/22/2020 03:30 PM   Modules accepted: Orders

## 2020-09-22 NOTE — Progress Notes (Signed)
Cardiology Office Note:    Date:  09/22/2020   ID:  Allison Gardner, DOB 01-29-1923, MRN [id number]  PCP:  Andres Shad, MD  Cardiologist:  Fransico Him, MD    Referring MD: Andres Shad, *   Chief Complaint  Patient presents with  . Follow-up    Chronic combined systolic/diastolic CHF, severe MR and CKD    History of Present Illness:    Allison Gardner is a 87 y.o. female with a hx of chronic combined systolic/diastolic CHF, DCM (EF 99991111 by echo 01/2020 and 20-25% with severe MR on echo 07/06/2020), HLD, PVD, CVA.  she was recently hospitalized with N/V which became intractable with dark green emesis and abdominal pain with up to 7 episodes of vomiting daily.  She presented to [facility] ER with N/V, worsening DOE and PND.  In ER SCR 2.24, hsTrop 91, Hbg 10.5 and EKG showed NSR with LBBB.  Cxray c/w vascular congestion and mild interstitial edema.  CT of abdomen showed gastritis, cirrhosis, right pleural effusion, diverticulosis.   Seen by Cardiology and felt to be a poor candidate for advanced therapies or invasive procedures given advance age and comorbidities. Her hsTrop was mildly elevated and felt to be due to demand ischemia.  She was started on IV lasix '80mg'$  BID. She was previously on entresto once daily, though this was stopped in the setting of AKI. She was discharged home on Lasix '80mg'$  BID, Hydralazine '10mg'$  TID, Imdur '15mg'$  daily and Toprol.   She is now here for followup of her CHF and MR with her family.   She says that occasionally she will have some pressure on her chest but not very often.  She does have SOB and her family says that sometimes she will complain at night of orthopnea and her family will have to prop her up on pillow.  She has not had any LE edema  dizziness, palpitations or syncope. Her Lasix was decreased to '80mg'$  daily and her Toprol was stopped due to low BP.  Her daughter has had to hold her hydralazine several times due to low BP.  She is  compliant with her meds and is tolerating meds with no SE.    Past Medical History:  Diagnosis Date  . Abnormal CT scan, colon   . Acute blood loss anemia 05/15/2020  . Acute GI bleeding 05/15/2020  . Acute on chronic congestive heart failure ([place])   . Angiodysplasia of colon with hemorrhage   . Cervical spondylosis 07/07/2020  . CHF (congestive heart failure) (HCC)    EF: 30-35% in 9/21  . Chronic anticoagulation 05/15/2020  . Chronic combined systolic and diastolic congestive heart failure ([place]) 05/15/2020  . Chronic systolic heart failure ([place]) 05/15/2020  . Dilated cardiomyopathy ([place]) 05/15/2020  . Fracture of pubic ramus ([place]) 04/15/2013  . GI bleed 05/15/2020  . Headache(784.0)   . Kidney stones   . Memory loss   . Mixed hyperlipidemia 05/15/2020  . Neck pain on left side 07/07/2020  . Peripheral arterial occlusive disease ([place]) 05/15/2020  . Pressure injury of skin 05/16/2020  . Stroke [facility])     Past Surgical History:  Procedure Laterality Date  . ABDOMINAL HYSTERECTOMY    . APPENDECTOMY    . COLONOSCOPY WITH PROPOFOL N/A 05/17/2020   Procedure: COLONOSCOPY WITH PROPOFOL;  Surgeon: Jerene Bears, MD;  Location: [place];  Service: Gastroenterology;  Laterality: N/A;  . ESOPHAGOGASTRODUODENOSCOPY (EGD) WITH PROPOFOL N/A 05/17/2020   Procedure: ESOPHAGOGASTRODUODENOSCOPY (EGD) WITH PROPOFOL;  Surgeon: Jerene Bears, MD;  Location: [facility] ENDOSCOPY;  Service: Gastroenterology;  Laterality: N/A;  . HEMOSTASIS CLIP PLACEMENT  05/17/2020   Procedure: HEMOSTASIS CLIP PLACEMENT;  Surgeon: Jerene Bears, MD;  Location: [place] ENDOSCOPY;  Service: Gastroenterology;;  . HOT HEMOSTASIS N/A 05/17/2020   Procedure: HOT HEMOSTASIS (ARGON PLASMA COAGULATION/BICAP);  Surgeon: Jerene Bears, MD;  Location: [facility] ENDOSCOPY;  Service: Gastroenterology;  Laterality: N/A;    Current Medications: Current Meds  Medication Sig  . clopidogrel (PLAVIX) 75 MG tablet Take 75 mg by mouth daily.  . furosemide (LASIX) 80 MG tablet  TAKE 1 TABLET (80 MG TOTAL) BY MOUTH TWO TIMES DAILY.  . hydrALAZINE (APRESOLINE) 10 MG tablet Take 1 tablet (10 mg total) by mouth every 8 (eight) hours.  . isosorbide mononitrate (IMDUR) 30 MG 24 hr tablet Take 0.5 tablets (15 mg total) by mouth daily.  Marland Kitchen levothyroxine (SYNTHROID) 25 MCG tablet Take 25 mcg by mouth daily.  Marland Kitchen oxyCODONE (ROXICODONE) 5 MG immediate release tablet Take 0.5 tablets (2.5 mg total) by mouth every 6 (six) hours as needed for moderate pain.  . pantoprazole (PROTONIX) 40 MG tablet Take 1 tablet (40 mg total) by mouth daily before breakfast.  . rosuvastatin (CRESTOR) 5 MG tablet Take 5 mg by mouth at bedtime.     Allergies:   Ampicillin and Demerol [meperidine]   Social History   Socioeconomic History  . Marital status: Widowed    Spouse name: Not on file  . Number of children: Not on file  . Years of education: Not on file  . Highest education level: Not on file  Occupational History  . Not on file  Tobacco Use  . Smoking status: Never Smoker  . Smokeless tobacco: Never Used  Substance and Sexual Activity  . Alcohol use: No  . Drug use: No  . Sexual activity: Not on file  Other Topics Concern  . Not on file  Social History Narrative   Patient lives at home her son stay with her.   Social Determinants of Health   Financial Resource Strain: Not on file  Food Insecurity: Not on file  Transportation Needs: Not on file  Physical Activity: Not on file  Stress: Not on file  Social Connections: Not on file     Family History: The patient's family history is not on file.  ROS:   Please see the history of present illness.    ROS  All other systems reviewed and negative.   EKGs/Labs/Other Studies Reviewed:    The following studies were reviewed today: 2D echo and hospital notes from 07/2020  EKG:  EKG is  ordered today and showed NSR with LBBB  Recent Labs: 07/05/2020: TSH 2.997 07/28/2020: ALT 12 07/29/2020: Hemoglobin 9.4; Platelets  134 07/30/2020: B Natriuretic Peptide 4,457.9; Magnesium 2.2 08/01/2020: BUN 49; Creatinine, Ser 2.56; Potassium 3.8; Sodium 144   Recent Lipid Panel    Component Value Date/Time   CHOL (H) 12/23/2008 0328    270        ATP III CLASSIFICATION:  <200     mg/dL   Desirable  200-239  mg/dL   Borderline High  >=240    mg/dL   High          TRIG 73 12/23/2008 0328   HDL 85 12/23/2008 0328   CHOLHDL 3.2 12/23/2008 0328   VLDL 15 12/23/2008 0328   LDLCALC (H) 12/23/2008 0328    170        Total Cholesterol/HDL:CHD  Risk Coronary Heart Disease Risk Table                     Men   Women  1/2 Average Risk   3.4   3.3  Average Risk       5.0   4.4  2 X Average Risk   9.6   7.1  3 X Average Risk  23.4   11.0        Use the calculated Patient Ratio above and the CHD Risk Table to determine the patient's CHD Risk.        ATP III CLASSIFICATION (LDL):  <100     mg/dL   Optimal  100-129  mg/dL   Near or Above                    Optimal  130-159  mg/dL   Borderline  160-189  mg/dL   High  >190     mg/dL   Very High    Physical Exam:    VS:  BP (!) 102/50   Pulse 92   Ht 5' (1.524 m)   Wt 94 lb 6.4 oz (42.8 kg)   SpO2 99%   BMI 18.44 kg/m     Wt Readings from Last 3 Encounters:  09/22/20 94 lb 6.4 oz (42.8 kg)  07/31/20 97 lb 7.1 oz (44.2 kg)  07/13/20 105 lb 12.8 oz (48 kg)     GEN: frail appearing in NAD HEENT: Normal NECK: No JVD; No carotid bruits LYMPHATICS: No lymphadenopathy CARDIAC:RRR, no murmurs, rubs, gallops RESPIRATORY:  Clear to auscultation without rales, wheezing or rhonchi  ABDOMEN: Soft, non-tender, non-distended MUSCULOSKELETAL:  No edema; No deformity  SKIN: Warm and dry NEUROLOGIC:  Alert and oriented x 3 PSYCHIATRIC:  Normal affect    ASSESSMENT:    1. Chronic combined systolic and diastolic heart failure ([place])   2. Nonrheumatic mitral valve regurgitation   3. Essential hypertension   4. Stage 3b chronic kidney disease ([place])    PLAN:     In order of problems listed above:  1.  Chronic combined systolic/diastolic CHF -she complains of intermittent DOE and some orthopnea which I suspect is related to her underlying severe MR -she does not appear volume overloaded on exam and actually her weight has continued to decrease and her BP is soft today -I will check a BNP and BMET today -decrease Lasix to '40mg'$  daily -I have asked her family to weight her every am and record weights and if she gains > 3lbs in 1 day or 5lbs in a week to call our office  2.  Severe MR -she is not a candidate for advanced therapies or surgical intervention with MV repair or MitraClip due to advanced age and comorbidities -I had a discussion with her family on goals of care today and explained that going forward our goal is to treat her with meds to prevent fluid overload  3.  HTN -BP is controlled on exam today and is actually soft -she is now off Toprol -I have told her daughter to stop her hydralazine since she has had to hold some doses  4.  CKD stage 3b -her last SCr was 2.56 -I will repeat BMET today since she is on diuretics   Medication Adjustments/Labs and Tests Ordered: Current medicines are reviewed at length with the patient today.  Concerns regarding medicines are outlined above.  No orders of the defined types were placed in this  encounter.  No orders of the defined types were placed in this encounter.   Signed, Fransico Him, MD  09/22/2020 1:53 PM    [place]

## 2020-09-22 NOTE — Patient Instructions (Signed)
Medication Instructions:  Your physician has recommended you make the following change in your medication:  1) DECREASE Lasix (furosemide) to 40 mg daily  2) STOP taking hydralazine   *If you need a refill on your cardiac medications before your next appointment, please call your pharmacy*  Lab Work: TODAY: BNP and BMET If you have labs (blood work) drawn today and your tests are completely normal, you will receive your results only by: Marland Kitchen MyChart Message (if you have MyChart) OR . A paper copy in the mail If you have any lab test that is abnormal or we need to change your treatment, we will call you to review the results.  Follow-Up: At [facility], you and your health needs are our priority.  As part of our continuing mission to provide you with exceptional heart care, we have created designated Provider Care Teams.  These Care Teams include your primary Cardiologist (physician) and Advanced Practice Providers (APPs -  Physician Assistants and Nurse Practitioners) who all work together to provide you with the care you need, when you need it.  Your next appointment:   3 month(s)  The format for your next appointment:   In Person  Provider:   You may see Fransico Him, MD or one of the following Advanced Practice Providers on your designated Care Team:    Melina Copa, PA-C  Ermalinda Barrios, PA-C

## 2020-09-23 LAB — BASIC METABOLIC PANEL
BUN/Creatinine Ratio: 21 (ref 12–28)
BUN: 40 mg/dL — ABNORMAL HIGH (ref 10–36)
CO2: 23 mmol/L (ref 20–29)
Calcium: 9.4 mg/dL (ref 8.7–10.3)
Chloride: 107 mmol/L — ABNORMAL HIGH (ref 96–106)
Creatinine, Ser: 1.88 mg/dL — ABNORMAL HIGH (ref 0.57–1.00)
Glucose: 83 mg/dL (ref 65–99)
Potassium: 4.3 mmol/L (ref 3.5–5.2)
Sodium: 146 mmol/L — ABNORMAL HIGH (ref 134–144)
eGFR: 24 mL/min/{1.73_m2} — ABNORMAL LOW (ref >59)

## 2020-09-23 LAB — PRO B NATRIURETIC PEPTIDE: NT-Pro BNP: 21010 pg/mL — ABNORMAL HIGH (ref 0–738)

## 2020-09-25 ENCOUNTER — Telehealth: Payer: Self-pay | Admitting: Cardiology

## 2020-09-25 DIAGNOSIS — I5022 Chronic systolic (congestive) heart failure: Secondary | ICD-10-CM

## 2020-09-25 DIAGNOSIS — I5042 Chronic combined systolic (congestive) and diastolic (congestive) heart failure: Secondary | ICD-10-CM

## 2020-09-25 NOTE — Telephone Encounter (Signed)
Allison Margarita, MD  09/23/2020 2:26 PM EDT      BNp elevated on labs but at [place] had actually continued to lose weight and lungs were clear. I am leary about increasing Lasix further. She has CKD but her renal function has improved. Please have her come in for chest xray at the [facility] office and then followup with Dr. Domenic Polite next week as she lives in [place] with the patient's son and he will take her to [facility] to have chest x-ray done. Orders have been faxed over.

## 2020-09-25 NOTE — Telephone Encounter (Signed)
    Pt's son is returning call to get lab result, he said he is with pt

## 2020-10-02 ENCOUNTER — Telehealth: Payer: Self-pay

## 2020-10-02 DIAGNOSIS — I5042 Chronic combined systolic (congestive) and diastolic (congestive) heart failure: Secondary | ICD-10-CM

## 2020-10-02 MED ORDER — FUROSEMIDE 40 MG PO TABS: 40.0000 mg | ORAL_TABLET | Freq: Two times a day (BID) | ORAL | 3 refills | Status: AC

## 2020-10-02 NOTE — Telephone Encounter (Signed)
Chest X-ray results received via fax. Per Dr. Radford Pax the patient needs to increase Lasix back to 40 mg twice daily. Check BNP and BMET and follow up in one week.  Spoke with the patient's son and advised him on recommendations. Follow up and lab work have been scheduled.

## 2020-10-05 ENCOUNTER — Telehealth: Payer: Self-pay | Admitting: Cardiology

## 2020-10-05 NOTE — Telephone Encounter (Signed)
Patient's daughter called to see if the appt on 31st is need being that she was just seeing on the 13th. Daughter stated if it really for labs work the reason she needs to be seen, can they just get that done in [place]. Please advise and reach out to the daughter [name]

## 2020-10-05 NOTE — Telephone Encounter (Signed)
Spoke with the patient's daughter and advised her on need for an appointment for in person evaluation after results of chest x-ray and change in her medication. Daughter verbalized understanding.

## 2020-10-10 ENCOUNTER — Ambulatory Visit: Payer: Medicare HMO | Admitting: Cardiology

## 2020-10-10 ENCOUNTER — Encounter: Payer: Self-pay | Admitting: Cardiology

## 2020-10-10 ENCOUNTER — Other Ambulatory Visit: Payer: Self-pay

## 2020-10-10 ENCOUNTER — Other Ambulatory Visit: Payer: Medicare HMO | Admitting: *Deleted

## 2020-10-10 VITALS — BP 92/48 | HR 97 | Ht 60.0 in | Wt 88.4 lb

## 2020-10-10 DIAGNOSIS — N1832 Chronic kidney disease, stage 3b: Secondary | ICD-10-CM

## 2020-10-10 DIAGNOSIS — I5042 Chronic combined systolic (congestive) and diastolic (congestive) heart failure: Secondary | ICD-10-CM | POA: Diagnosis not present

## 2020-10-10 DIAGNOSIS — I10 Essential (primary) hypertension: Secondary | ICD-10-CM

## 2020-10-10 DIAGNOSIS — I340 Nonrheumatic mitral (valve) insufficiency: Secondary | ICD-10-CM | POA: Diagnosis not present

## 2020-10-10 NOTE — Progress Notes (Signed)
Cardiology Office Note:    Date:  10/10/2020   ID:  Allison Gardner, DOB 08-14-1922, MRN [id number]  PCP:  Andres Shad, MD  Cardiologist:  Fransico Him, MD    Referring MD: Andres Shad, *   Chief Complaint  Patient presents with  . Congestive Heart Failure  . Hypertension  . Mitral Regurgitation    History of Present Illness:    Allison Gardner is a 87 y.o. female with a hx of chronic combined systolic/diastolic CHF, DCM (EF 99991111 by echo 01/2020 and 20-25% with severe MR on echo 07/06/2020), HLD, PVD, CVA.  she was recently hospitalized with N/V which became intractable with dark green emesis and abdominal pain with up to 7 episodes of vomiting daily.  She presented to [facility] ER with N/V, worsening DOE and PND.  In ER SCR 2.24, hsTrop 91, Hbg 10.5 and EKG showed NSR with LBBB.  Cxray c/w vascular congestion and mild interstitial edema.  CT of abdomen showed gastritis, cirrhosis, right pleural effusion, diverticulosis.   Seen by Cardiology and felt to be a poor candidate for advanced therapies or invasive procedures given advance age and comorbidities. Her hsTrop was mildly elevated and felt to be due to demand ischemia.  She was started on IV lasix '80mg'$  BID. She was previously on entresto once daily, though this was stopped in the setting of AKI. She was discharged home on Lasix '80mg'$  BID, Hydralazine '10mg'$  TID, Imdur '15mg'$  daily and Toprol.   When I saw her last her Bp was very soft and her Toprol and Hydralazine were stopped.  She was still SOB which was felt to be related to her severe MR and a BNp was elevated and Cxray showed interstitial edema.  Her Lasix was increased back to '80mg'$  daily.  She is here today for followup and is doing well.  She continues to have chronic DOE and says that she is severely fatigued with any exertion.  She occasionally has some mild chest pain at times but not much.  . She denies any PND, orthopnea, LE edema, palpitations or syncope.  She has been having dizziness when she stands up.  She has a very poor appetite as well.  She is compliant with her meds and is tolerating meds with no SE.    Past Medical History:  Diagnosis Date  . Abnormal CT scan, colon   . Acute blood loss anemia 05/15/2020  . Acute GI bleeding 05/15/2020  . Acute on chronic congestive heart failure ([place])   . Angiodysplasia of colon with hemorrhage   . Cervical spondylosis 07/07/2020  . CHF (congestive heart failure) (HCC)    EF: 30-35% in 9/21  . Chronic anticoagulation 05/15/2020  . Chronic combined systolic and diastolic congestive heart failure ([place]) 05/15/2020  . Chronic systolic heart failure ([place]) 05/15/2020  . Dilated cardiomyopathy ([place]) 05/15/2020  . Fracture of pubic ramus ([place]) 04/15/2013  . GI bleed 05/15/2020  . Headache(784.0)   . Kidney stones   . Memory loss   . Mixed hyperlipidemia 05/15/2020  . Neck pain on left side 07/07/2020  . Peripheral arterial occlusive disease ([place]) 05/15/2020  . Pressure injury of skin 05/16/2020  . Stroke [facility])     Past Surgical History:  Procedure Laterality Date  . ABDOMINAL HYSTERECTOMY    . APPENDECTOMY    . COLONOSCOPY WITH PROPOFOL N/A 05/17/2020   Procedure: COLONOSCOPY WITH PROPOFOL;  Surgeon: Jerene Bears, MD;  Location: [place];  Service: Gastroenterology;  Laterality: N/A;  .  ESOPHAGOGASTRODUODENOSCOPY (EGD) WITH PROPOFOL N/A 05/17/2020   Procedure: ESOPHAGOGASTRODUODENOSCOPY (EGD) WITH PROPOFOL;  Surgeon: Jerene Bears, MD;  Location: [place];  Service: Gastroenterology;  Laterality: N/A;  . HEMOSTASIS CLIP PLACEMENT  05/17/2020   Procedure: HEMOSTASIS CLIP PLACEMENT;  Surgeon: Jerene Bears, MD;  Location: [place] ENDOSCOPY;  Service: Gastroenterology;;  . HOT HEMOSTASIS N/A 05/17/2020   Procedure: HOT HEMOSTASIS (ARGON PLASMA COAGULATION/BICAP);  Surgeon: Jerene Bears, MD;  Location: [facility] ENDOSCOPY;  Service: Gastroenterology;  Laterality: N/A;    Current Medications: Current Meds  Medication Sig  .  clopidogrel (PLAVIX) 75 MG tablet Take 75 mg by mouth daily.  . furosemide (LASIX) 40 MG tablet Take 1 tablet (40 mg total) by mouth 2 (two) times daily.  . isosorbide mononitrate (IMDUR) 30 MG 24 hr tablet Take 0.5 tablets (15 mg total) by mouth daily.  Marland Kitchen levothyroxine (SYNTHROID) 25 MCG tablet Take 25 mcg by mouth daily.  . pantoprazole (PROTONIX) 40 MG tablet Take 1 tablet (40 mg total) by mouth daily before breakfast.  . rosuvastatin (CRESTOR) 5 MG tablet Take 5 mg by mouth at bedtime.     Allergies:   Ampicillin and Demerol [meperidine]   Social History   Socioeconomic History  . Marital status: Widowed    Spouse name: Not on file  . Number of children: Not on file  . Years of education: Not on file  . Highest education level: Not on file  Occupational History  . Not on file  Tobacco Use  . Smoking status: Never Smoker  . Smokeless tobacco: Never Used  Substance and Sexual Activity  . Alcohol use: No  . Drug use: No  . Sexual activity: Not on file  Other Topics Concern  . Not on file  Social History Narrative   Patient lives at home her son stay with her.   Social Determinants of Health   Financial Resource Strain: Not on file  Food Insecurity: Not on file  Transportation Needs: Not on file  Physical Activity: Not on file  Stress: Not on file  Social Connections: Not on file     Family History: The patient's family history is not on file.  ROS:   Please see the history of present illness.    ROS  All other systems reviewed and negative.   EKGs/Labs/Other Studies Reviewed:    The following studies were reviewed today: 2D echo and hospital notes from 07/2020  EKG:  EKG I not ordered today  Recent Labs: 07/05/2020: TSH 2.997 07/28/2020: ALT 12 07/29/2020: Hemoglobin 9.4; Platelets 134 07/30/2020: B Natriuretic Peptide 4,457.9; Magnesium 2.2 09/22/2020: BUN 40; Creatinine, Ser 1.88; NT-Pro BNP 21,010; Potassium 4.3; Sodium 146   Recent Lipid Panel     Component Value Date/Time   CHOL (H) 12/23/2008 0328    270        ATP III CLASSIFICATION:  <200     mg/dL   Desirable  200-239  mg/dL   Borderline High  >=240    mg/dL   High          TRIG 73 12/23/2008 0328   HDL 85 12/23/2008 0328   CHOLHDL 3.2 12/23/2008 0328   VLDL 15 12/23/2008 0328   LDLCALC (H) 12/23/2008 0328    170        Total Cholesterol/HDL:CHD Risk Coronary Heart Disease Risk Table                     Men   Women  1/2  Average Risk   3.4   3.3  Average Risk       5.0   4.4  2 X Average Risk   9.6   7.1  3 X Average Risk  23.4   11.0        Use the calculated Patient Ratio above and the CHD Risk Table to determine the patient's CHD Risk.        ATP III CLASSIFICATION (LDL):  <100     mg/dL   Optimal  100-129  mg/dL   Near or Above                    Optimal  130-159  mg/dL   Borderline  160-189  mg/dL   High  >190     mg/dL   Very High    Physical Exam:    VS:  Ht 5' (1.524 m)   Wt 88 lb 6.4 oz (40.1 kg)   BMI 17.26 kg/m     Wt Readings from Last 3 Encounters:  10/10/20 88 lb 6.4 oz (40.1 kg)  09/22/20 94 lb 6.4 oz (42.8 kg)  07/31/20 97 lb 7.1 oz (44.2 kg)     GEN: thin and very frail HEENT: Normal NECK: No JVD; No carotid bruits LYMPHATICS: No lymphadenopathy CARDIAC:RRR, no murmurs, rubs, gallops RESPIRATORY:  Clear to auscultation without rales, wheezing or rhonchi  ABDOMEN: Soft, non-tender, non-distended MUSCULOSKELETAL:  No edema; No deformity  SKIN: Warm and dry NEUROLOGIC:  Alert and oriented x 3 PSYCHIATRIC:  Normal affect    ASSESSMENT:    1. Chronic combined systolic and diastolic heart failure ([place])   2. Nonrheumatic mitral valve regurgitation   3. Essential hypertension   4. Stage 3b chronic kidney disease (HCC)    PLAN:    In order of problems listed above:  1.  DCM/Chronic combined systolic/diastolic CHF -she has chronic DOE related to underlying severe MR  -at last OV a BNP was elevated and Cxray showed  interstitial edema and her Lasix was increased back to '80mg'$  daily -she has not really improved any with increased doses of lasix although her lungs are clear and she has no edema -I suspect that she has low output HFrEF -I have   2.  Severe MR -she is not a candidate for advanced therapies or surgical intervention with MV repair or MitraClip due to advanced age and comorbidities -I had a discussion with her family on goals of care today and explained that going forward our goal is to treat her with meds to prevent fluid overload  3.  HTN -BP remains soft on exam today -Toprol and Hydralazine were stopped due to soft BP -I have encouraged her to wear the compression hose during the day  4.  CKD stage 3b -her last SCr was 2.56 and then after stopping HTN meds and decreasing diuretic her SCr went back to 1.88 -her lasix was increased again due to acute CHF   Medication Adjustments/Labs and Tests Ordered: Current medicines are reviewed at length with the patient today.  Concerns regarding medicines are outlined above.  No orders of the defined types were placed in this encounter.  No orders of the defined types were placed in this encounter.   Signed, Fransico Him, MD  10/10/2020 1:41 PM    [place]

## 2020-10-10 NOTE — Patient Instructions (Signed)
Medication Instructions:  Your physician recommends that you continue on your current medications as directed. Please refer to the Current Medication list given to you today.  *If you need a refill on your cardiac medications before your next appointment, please call your pharmacy*  Follow-Up: At [facility], you and your health needs are our priority.  As part of our continuing mission to provide you with exceptional heart care, we have created designated Provider Care Teams.  These Care Teams include your primary Cardiologist (physician) and Advanced Practice Providers (APPs -  Physician Assistants and Nurse Practitioners) who all work together to provide you with the care you need, when you need it.  Your next appointment:   3 month(s)  The format for your next appointment:   In Person  Provider:   Rozann Lesches, MD

## 2020-10-11 LAB — BASIC METABOLIC PANEL
BUN/Creatinine Ratio: 20 (ref 12–28)
BUN: 50 mg/dL — ABNORMAL HIGH (ref 10–36)
CO2: 22 mmol/L (ref 20–29)
Calcium: 9.5 mg/dL (ref 8.7–10.3)
Chloride: 103 mmol/L (ref 96–106)
Creatinine, Ser: 2.49 mg/dL — ABNORMAL HIGH (ref 0.57–1.00)
Glucose: 109 mg/dL — ABNORMAL HIGH (ref 65–99)
Potassium: 3.9 mmol/L (ref 3.5–5.2)
Sodium: 145 mmol/L — ABNORMAL HIGH (ref 134–144)
eGFR: 17 mL/min/{1.73_m2} — ABNORMAL LOW (ref >59)

## 2020-10-11 LAB — PRO B NATRIURETIC PEPTIDE: NT-Pro BNP: 16687 pg/mL — ABNORMAL HIGH (ref 0–738)

## 2020-10-27 ENCOUNTER — Telehealth: Payer: Self-pay

## 2020-10-27 MED ORDER — ISOSORBIDE MONONITRATE ER 30 MG PO TB24: 15.0000 mg | ORAL_TABLET | Freq: Every day | ORAL | 0 refills | Status: DC

## 2020-10-27 NOTE — Telephone Encounter (Signed)
*  STAT* If patient is at the pharmacy, call can be transferred to refill team.   1. Which medications need to be refilled? (please list name of each medication and dose if known) Isosorbide   2. Which pharmacy/location (including street and city if local pharmacy) is medication to be sent to? [facility] [place], [place]   3. Do they need a 30 day or 90 day supply? 90 day        Received voicemail from patients daughter requesting a refill for her mom.  Left message informing daughter that rx has been sent to the pharmacy. Advised a call back with any questions or concerns.

## 2020-10-31 ENCOUNTER — Other Ambulatory Visit: Payer: Self-pay

## 2020-10-31 MED ORDER — ISOSORBIDE MONONITRATE ER 30 MG PO TB24: 15.0000 mg | ORAL_TABLET | Freq: Every day | ORAL | 3 refills | Status: AC

## 2020-10-31 NOTE — Telephone Encounter (Signed)
Pt's medication was sent to pt's pharmacy as requested. Confirmation received.  °

## 2021-01-11 DEATH — deceased

## 2021-01-19 ENCOUNTER — Ambulatory Visit: Payer: Medicare HMO | Admitting: Cardiology

## 2022-01-01 IMAGING — CT CT ABD-PELV W/O CM
2 of 4 series · 15 of 46 positions shown, 17 images · non-contrast
Comparison: Right upper quadrant ultrasound 07/07/2020

CLINICAL DATA: [AGE] with abdominal distension and acute
abdominal pain. Vomiting for 2 days.

EXAM:
CT ABDOMEN AND PELVIS WITHOUT CONTRAST
TECHNIQUE: Multidetector CT imaging of the abdomen and pelvis was performed
following the standard protocol without IV contrast.

[Series 2: axial st · axial · 0.79mm/px · z∈[-380,-25]mm · 12 of 81 slices shown, 14 images]
[im 5/81  soft-tissue]
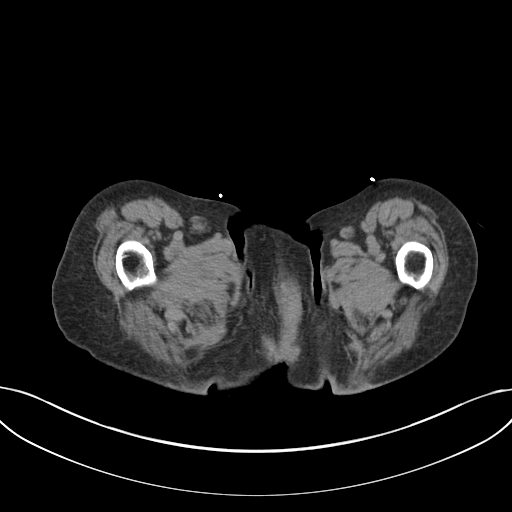
[im 5/81  bone]
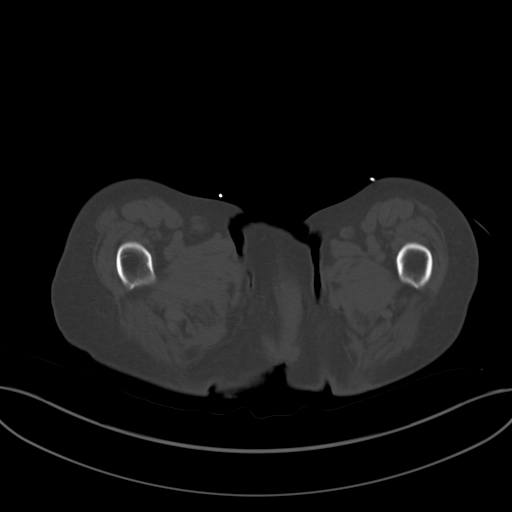
[im 15/81  soft-tissue]
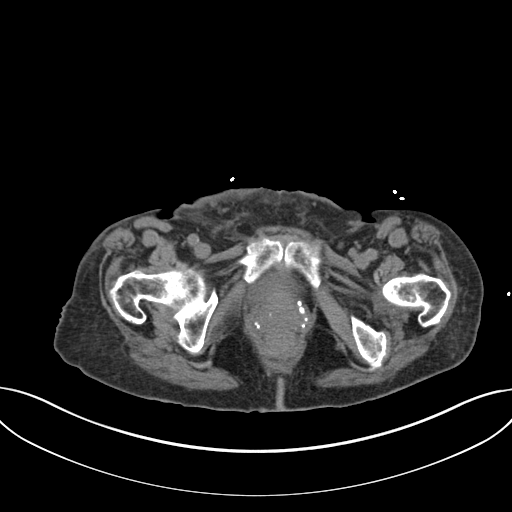
[im 19/81  soft-tissue]
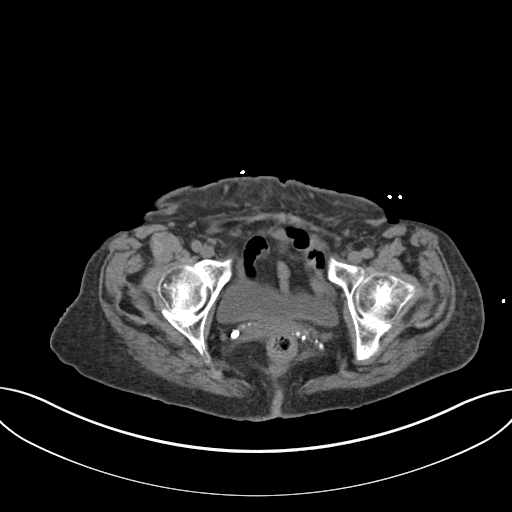
[im 24/81  soft-tissue]
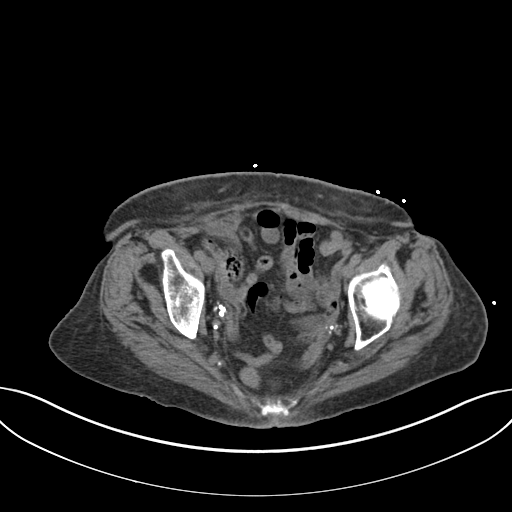
[im 33/81  soft-tissue]
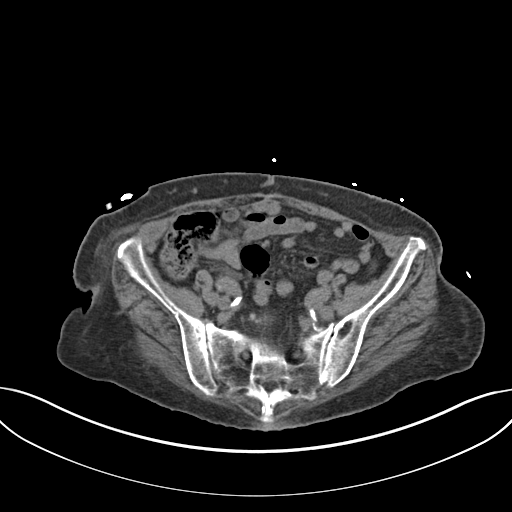
[im 38/81  soft-tissue]
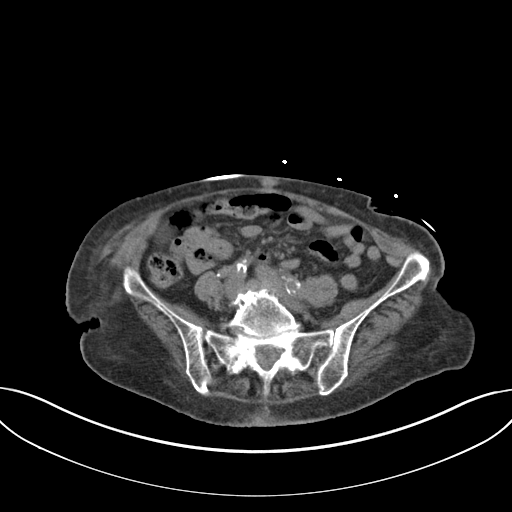
[im 43/81  soft-tissue]
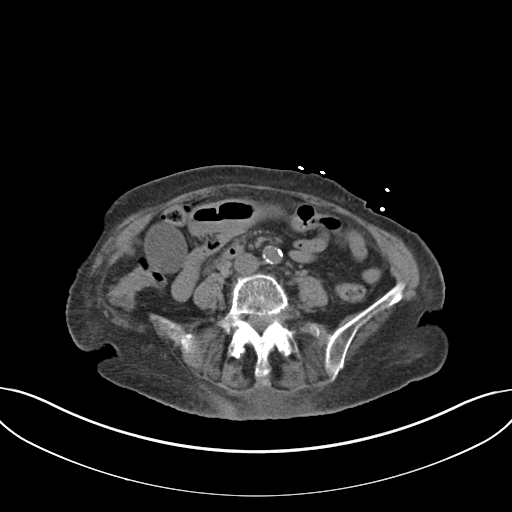
[im 52/81  soft-tissue]
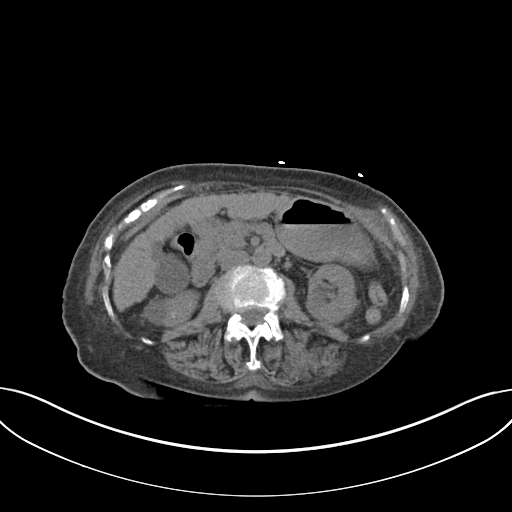
[im 57/81  soft-tissue]
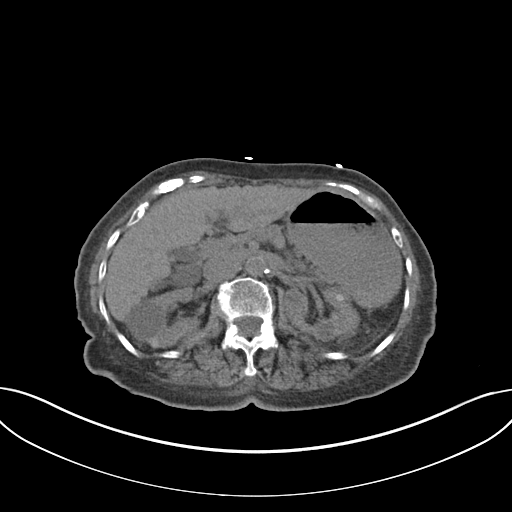
[im 57/81  bone]
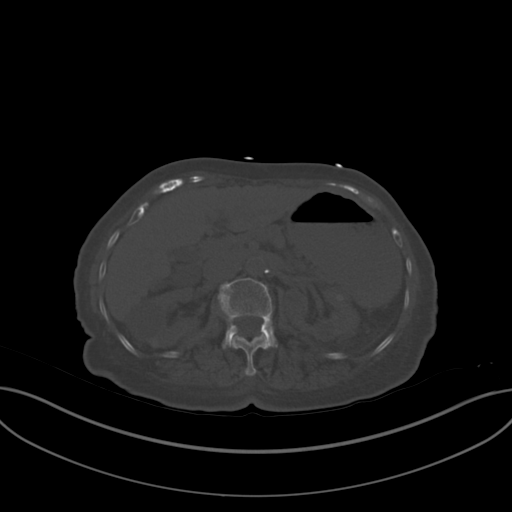
[im 62/81  soft-tissue]
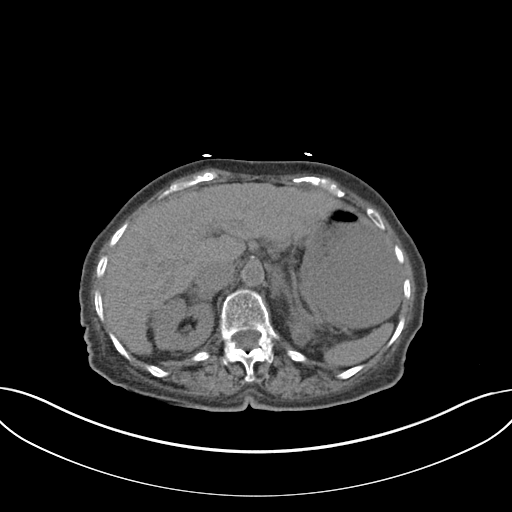
[im 71/81  soft-tissue]
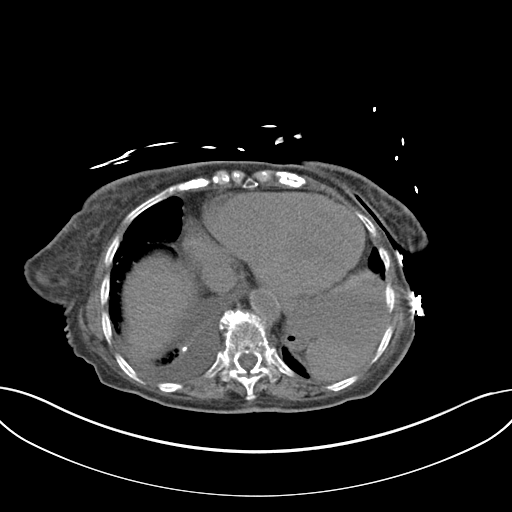
[im 76/81  soft-tissue]
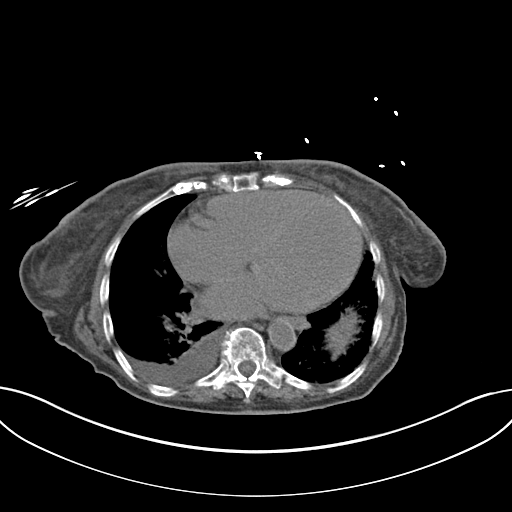

[Series 4: coronal st · coronal · 0.66mm/px · 3 of 107 slices shown]
[im 36/107  soft-tissue]
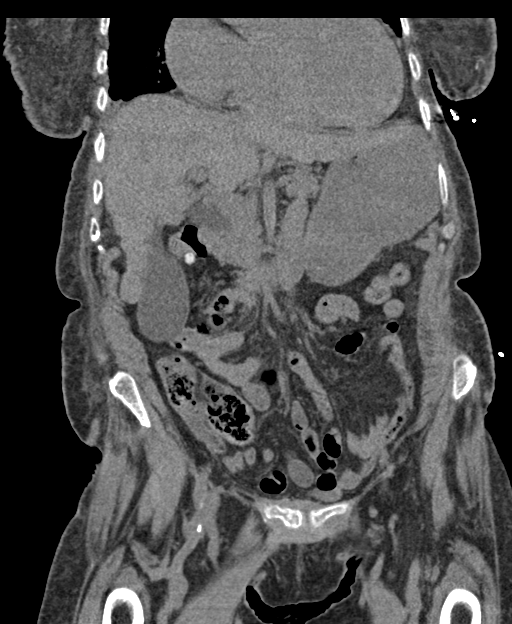
[im 48/107  soft-tissue]
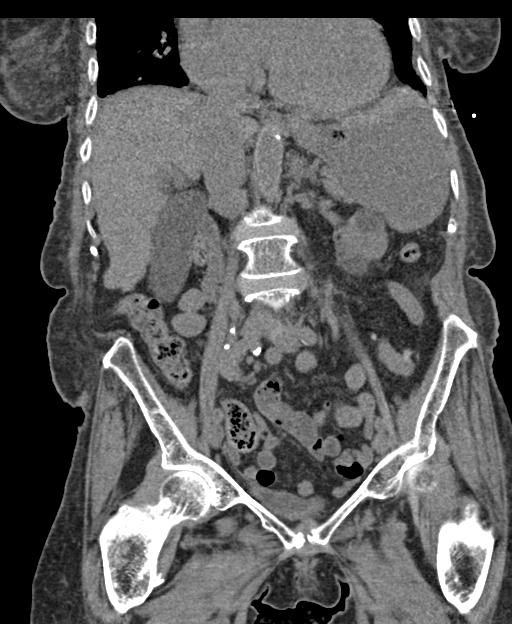
[im 59/107  soft-tissue]
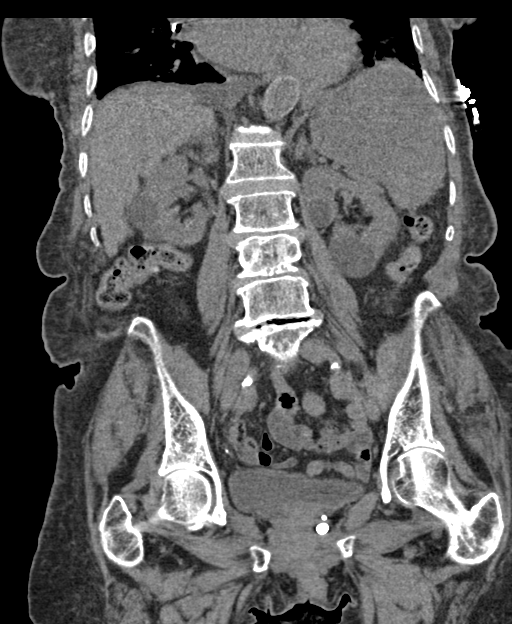

[15 of 46 positions shown; findings below may reference images not displayed]

FINDINGS: Lower chest: Moderate right pleural effusion partially included.
Cardiomegaly. Decreased blood pool consistent with anemia. No
pericardial effusion.

Hepatobiliary: Nodular hepatic contours consistent with cirrhosis.
No evidence of focal hepatic lesion on noncontrast exam.
Unremarkable gallbladder. No calcified gallstone. Proximal common
bile duct appears dilated of 15 mm, and there is tapering to the
duodenal insertion.

Pancreas: Mildly atrophic.  No ductal dilatation or inflammation.

Spleen: No splenomegaly.  No focal splenic abnormality.

Adrenals/Urinary Tract: No adrenal nodule. No hydronephrosis.
Multiple bilateral renal cysts. There are also subcentimeter
hyperdense lesions in the upper left and mid right kidney, likely
hyperdense cysts. Possible punctate nonobstructing stone in the
upper right kidney. No ureteral stone. Decompressed urinary bladder.

Stomach/Bowel: Distended stomach with air-fluid level. There is mild
pre pyloric gastric wall thickening. Decompressed small bowel
without obstruction or inflammation. Appendix not visualized, prior
appendectomy per history diverticulosis throughout the entire colon,
most prominent distally. No diverticulitis. No colonic
inflammation.

Vascular/Lymphatic: Moderately advanced aortic atherosclerosis. No
aortic aneurysm. There are few calcified right upper quadrant lymph
nodes suggesting prior granulomatous disease. No noncalcified
adenopathy.

Reproductive: Status post hysterectomy. No adnexal masses.

Other: No significant intra-abdominal ascites. No free air or focal
fluid collection. There is mild generalized body wall edema. No
abdominal wall hernia.

Musculoskeletal: Degenerative disc disease and facet hypertrophy in
the spine. There are no acute or suspicious osseous abnormalities.
IMPRESSION: 1. Mild pre pyloric gastric wall thickening may be related to peptic
ulcer disease or gastritis. Mild gastric distension with fluid
level.
2. Cirrhosis. Mild common bile duct dilatation which normally tapers
at the duodenal insertion.
3. Moderate right pleural effusion partially included. Cardiomegaly.
4. Colonic diverticulosis without diverticulitis.
5. Possible punctate nonobstructing stone in the upper right kidney.
6. Bilateral renal cysts. Additional subcentimeter hyperdense
lesions in both kidneys are likely hyperdense cysts, but are not
well characterized on noncontrast exam.

Aortic Atherosclerosis ([id number]).
